# Patient Record
Sex: Male | Born: 1940 | Race: Black or African American | Hispanic: No
Health system: Southern US, Community
[De-identification: ages and names within clinical notes are randomized; demographics above are authoritative.]

## PROBLEM LIST (undated history)

## (undated) DIAGNOSIS — I252 Old myocardial infarction: Secondary | ICD-10-CM

## (undated) DIAGNOSIS — I219 Acute myocardial infarction, unspecified: Secondary | ICD-10-CM

## (undated) DIAGNOSIS — I480 Paroxysmal atrial fibrillation: Secondary | ICD-10-CM

## (undated) DIAGNOSIS — I2510 Atherosclerotic heart disease of native coronary artery without angina pectoris: Secondary | ICD-10-CM

## (undated) DIAGNOSIS — K648 Other hemorrhoids: Secondary | ICD-10-CM

## (undated) DIAGNOSIS — D126 Benign neoplasm of colon, unspecified: Secondary | ICD-10-CM

## (undated) DIAGNOSIS — K602 Anal fissure, unspecified: Secondary | ICD-10-CM

## (undated) DIAGNOSIS — K5790 Diverticulosis of intestine, part unspecified, without perforation or abscess without bleeding: Secondary | ICD-10-CM

## (undated) DIAGNOSIS — I10 Essential (primary) hypertension: Secondary | ICD-10-CM

## (undated) DIAGNOSIS — E7800 Pure hypercholesterolemia, unspecified: Secondary | ICD-10-CM

## (undated) DIAGNOSIS — Z9861 Coronary angioplasty status: Principal | ICD-10-CM

## (undated) HISTORY — PX: CORONARY ANGIOPLASTY WITH STENT PLACEMENT: SHX49

## (undated) HISTORY — DX: Coronary angioplasty status: Z98.61

## (undated) HISTORY — PX: UMBILICAL HERNIA REPAIR: SHX196

## (undated) HISTORY — DX: Atherosclerotic heart disease of native coronary artery without angina pectoris: I25.10

## (undated) HISTORY — DX: Diverticulosis of intestine, part unspecified, without perforation or abscess without bleeding: K57.90

## (undated) HISTORY — DX: Old myocardial infarction: I25.2

## (undated) HISTORY — DX: Other hemorrhoids: K64.8

## (undated) HISTORY — DX: Benign neoplasm of colon, unspecified: D12.6

## (undated) HISTORY — DX: Anal fissure, unspecified: K60.2

## (undated) HISTORY — PX: ROTATOR CUFF REPAIR: SHX139

## (undated) HISTORY — PX: TRANSTHORACIC ECHOCARDIOGRAM: SHX275

---

## 1999-07-19 ENCOUNTER — Emergency Department (HOSPITAL_COMMUNITY): Admission: EM | Admit: 1999-07-19 | Discharge: 1999-07-19 | Payer: Self-pay | Admitting: Emergency Medicine

## 1999-07-19 ENCOUNTER — Encounter: Payer: Self-pay | Admitting: Emergency Medicine

## 2003-08-15 DIAGNOSIS — D126 Benign neoplasm of colon, unspecified: Secondary | ICD-10-CM

## 2003-08-15 HISTORY — DX: Benign neoplasm of colon, unspecified: D12.6

## 2007-05-22 ENCOUNTER — Ambulatory Visit: Payer: Self-pay | Admitting: Gastroenterology

## 2007-05-26 ENCOUNTER — Ambulatory Visit: Payer: Self-pay | Admitting: Gastroenterology

## 2007-05-26 ENCOUNTER — Encounter: Payer: Self-pay | Admitting: Gastroenterology

## 2010-09-04 ENCOUNTER — Encounter: Admission: RE | Admit: 2010-09-04 | Discharge: 2010-09-04 | Payer: Self-pay | Admitting: Orthopaedic Surgery

## 2012-02-25 DIAGNOSIS — E785 Hyperlipidemia, unspecified: Secondary | ICD-10-CM | POA: Diagnosis not present

## 2012-02-25 DIAGNOSIS — Z79899 Other long term (current) drug therapy: Secondary | ICD-10-CM | POA: Diagnosis not present

## 2012-02-25 DIAGNOSIS — I10 Essential (primary) hypertension: Secondary | ICD-10-CM | POA: Diagnosis not present

## 2012-02-25 DIAGNOSIS — M109 Gout, unspecified: Secondary | ICD-10-CM | POA: Diagnosis not present

## 2012-03-12 ENCOUNTER — Encounter: Payer: Self-pay | Admitting: Internal Medicine

## 2012-04-15 ENCOUNTER — Encounter (HOSPITAL_COMMUNITY): Payer: Self-pay | Admitting: Emergency Medicine

## 2012-04-15 ENCOUNTER — Emergency Department (HOSPITAL_COMMUNITY): Payer: Medicare Other

## 2012-04-15 ENCOUNTER — Emergency Department (HOSPITAL_COMMUNITY)
Admission: EM | Admit: 2012-04-15 | Discharge: 2012-04-15 | Disposition: A | Discharge status: Home / Self Care | Payer: Medicare Other | Attending: Emergency Medicine | Admitting: Emergency Medicine

## 2012-04-15 ENCOUNTER — Encounter (HOSPITAL_COMMUNITY): Payer: Self-pay

## 2012-04-15 ENCOUNTER — Emergency Department (INDEPENDENT_AMBULATORY_CARE_PROVIDER_SITE_OTHER)
Admission: EM | Admit: 2012-04-15 | Discharge: 2012-04-15 | Disposition: A | Discharge status: Nursing Home (Includes ICF) | Payer: Medicare Other | Source: Home / Self Care

## 2012-04-15 DIAGNOSIS — N509 Disorder of male genital organs, unspecified: Secondary | ICD-10-CM

## 2012-04-15 DIAGNOSIS — Z79899 Other long term (current) drug therapy: Secondary | ICD-10-CM | POA: Insufficient documentation

## 2012-04-15 DIAGNOSIS — N50811 Right testicular pain: Secondary | ICD-10-CM

## 2012-04-15 DIAGNOSIS — N453 Epididymo-orchitis: Secondary | ICD-10-CM | POA: Insufficient documentation

## 2012-04-15 DIAGNOSIS — I10 Essential (primary) hypertension: Secondary | ICD-10-CM | POA: Diagnosis not present

## 2012-04-15 DIAGNOSIS — E7800 Pure hypercholesterolemia, unspecified: Secondary | ICD-10-CM | POA: Diagnosis not present

## 2012-04-15 HISTORY — DX: Essential (primary) hypertension: I10

## 2012-04-15 HISTORY — DX: Pure hypercholesterolemia, unspecified: E78.00

## 2012-04-15 LAB — POCT URINALYSIS DIP (DEVICE)
Bilirubin Urine: NEGATIVE
Glucose, UA: NEGATIVE mg/dL
Ketones, ur: NEGATIVE mg/dL
Nitrite: POSITIVE — AB
Protein, ur: NEGATIVE mg/dL
Specific Gravity, Urine: 1.015 (ref 1.005–1.030)
Urobilinogen, UA: 0.2 mg/dL (ref 0.0–1.0)
pH: 6.5 (ref 5.0–8.0)

## 2012-04-15 MED ORDER — HYDROCODONE-ACETAMINOPHEN 5-325 MG PO TABS: 1.0000 | ORAL_TABLET | Freq: Four times a day (QID) | ORAL | Status: AC | PRN

## 2012-04-15 MED ORDER — IBUPROFEN 600 MG PO TABS: 600.0000 mg | ORAL_TABLET | Freq: Two times a day (BID) | ORAL | Status: AC

## 2012-04-15 MED ORDER — LEVOFLOXACIN 500 MG PO TABS
500.0000 mg | ORAL_TABLET | Freq: Every day | ORAL | Status: DC
  Administered 2012-04-15: 500 mg via ORAL
  Filled 2012-04-15 (×2): qty 1

## 2012-04-15 MED ORDER — LEVOFLOXACIN 500 MG PO TABS: 500.0000 mg | ORAL_TABLET | Freq: Every day | ORAL | Status: AC

## 2012-04-15 MED ORDER — HYDROCODONE-ACETAMINOPHEN 5-325 MG PO TABS
1.0000 | ORAL_TABLET | Freq: Once | ORAL | Status: AC
  Administered 2012-04-15: 1 via ORAL
  Filled 2012-04-15: qty 1

## 2012-04-15 MED ORDER — IBUPROFEN 200 MG PO TABS
400.0000 mg | ORAL_TABLET | Freq: Once | ORAL | Status: AC
  Administered 2012-04-15: 400 mg via ORAL
  Filled 2012-04-15: qty 2

## 2012-04-15 NOTE — ED Notes (Signed)
C/o swelling and pain to rt groin and rt testicle.  States he lifted a very heavy cooler on Saturday and has been hurting since. States pain is worse with movement.

## 2012-04-15 NOTE — ED Notes (Signed)
Ok to order [place] per Dr Hyman Hopes

## 2012-04-15 NOTE — ED Notes (Signed)
Pt here from [facility] for eval of right testicle swelling and right groin pain after lifting heavy object; pt noted to have pain and swelling; concerned for torsion

## 2012-04-15 NOTE — ED Provider Notes (Signed)
History     CSN: [id number]  Arrival date & time 04/15/12  1504   None     Chief Complaint  Patient presents with  . Groin Pain    (Consider location/radiation/quality/duration/timing/severity/associated sxs/prior treatment) Patient is a 71 y.o. male presenting with groin pain. The history is provided by the patient and the spouse.  Groin Pain  Patient complains of right testicular pain that began 4 days ago after riding a motorcycle for three hours and assisting with lifting a 100lb cooler.  Pain is constant and throbbing in nature, nonradiating.  States pain is worse with sitting and with movement.  No known alleviating factors.   Denies nausea and vomiting, no fever noted, no change in urine or stool, no dysuria/frequency/urgency.  Denies history of urethral discharge prior to swelling.  No history of urological disorders/disease.  Has applied ice to area to assist with swelling and pain.    Past Medical History  Diagnosis Date  . Hypertension   . High cholesterol     Past Surgical History  Procedure Date  . Umbilical hernia repair   . Rotator cuff repair     No family history on file.  History  Substance Use Topics  . Smoking status: Never Smoker   . Smokeless tobacco: Not on file  . Alcohol Use: No      Review of Systems  All other systems reviewed and are negative.    Allergies  Review of patient's allergies indicates no known allergies.  Home Medications   Current Outpatient Rx  Name Route Sig Dispense Refill  . HYDROCHLOROTHIAZIDE 25 MG PO TABS Oral Take 25 mg by mouth daily.    . IRBESARTAN 300 MG PO TABS Oral Take 300 mg by mouth daily.    Marland Kitchen ROSUVASTATIN CALCIUM 20 MG PO TABS Oral Take 20 mg by mouth daily.      BP 119/67  Pulse 88  Temp 97.8 F (36.6 C) (Oral)  Resp 18  SpO2 97%  Physical Exam  Nursing note and vitals reviewed. Constitutional: He is oriented to person, place, and time. Vital signs are normal. He appears well-developed and  well-nourished. He is active and cooperative.  HENT:  Head: Normocephalic.  Eyes: Conjunctivae are normal. Pupils are equal, round, and reactive to light. No scleral icterus.  Neck: Trachea normal. Neck supple.  Cardiovascular: Normal rate, regular rhythm, normal heart sounds and normal pulses.   Pulmonary/Chest: Effort normal and breath sounds normal.  Abdominal: Normal appearance and bowel sounds are normal. He exhibits distension. He exhibits no mass. There is no hepatosplenomegaly. There is tenderness. There is no rebound and no CVA tenderness. Hernia confirmed negative in the right inguinal area and confirmed negative in the left inguinal area.  Genitourinary: Penis normal. Cremasteric reflex is present. Right testis shows swelling and tenderness. Circumcised. No discharge found.  Lymphadenopathy:       Right: Inguinal adenopathy present.  Neurological: He is alert and oriented to person, place, and time. No cranial nerve deficit or sensory deficit.  Skin: Skin is warm and dry.  Psychiatric: He has a normal mood and affect. His speech is normal and behavior is normal. Judgment and thought content normal. Cognition and memory are normal.    ED Course  Procedures (including critical care time)  Labs Reviewed  POCT URINALYSIS DIP (DEVICE) - Abnormal; Notable for the following:    Hgb urine dipstick SMALL (*)     Nitrite POSITIVE (*)     Leukocytes, UA MODERATE (*)  Biochemical Testing Only. Please order routine urinalysis from main lab if confirmatory testing is needed.   All other components within normal limits   No results found.   1. Right testicular pain   2. Epididymo-orchitis, acute       MDM  Probable epididymitis/orchitis infection.  Given age and complaints, after speaking with Dr. Ladon Applebaum, decision to transfer to [facility] for testicular ultrasound to rule out testicular abnormality.  Johnsie Kindred, NP 04/15/12 1758

## 2012-04-15 NOTE — ED Notes (Signed)
Pt was lifting heavy things on Saturday and has had increasing right testicle pain since. Pain is steady and progressing. Pain is 6/10. Pain increases with movement and palpation. Denies any discoloration to area. States it is swollen.

## 2012-04-15 NOTE — ED Provider Notes (Signed)
History     CSN: [id number]  Arrival date & time 04/15/12  1753   First MD Initiated Contact with Patient 04/15/12 1923      Chief Complaint  Patient presents with  . Groin Pain    (Consider location/radiation/quality/duration/timing/severity/associated sxs/prior treatment) HPI Comments: Patient is a 71 year old man presenting with right groin pain from an urgent care clinic.  He was sent for torsion rule out versus epididymitis/orchitis.  Patient denies nausea and vomiting and describes the pain as constant and throbbing.  No history of urology disorder.  Patient without any other complaints.  The history is provided by the patient.    Past Medical History  Diagnosis Date  . Hypertension   . High cholesterol     Past Surgical History  Procedure Date  . Umbilical hernia repair   . Rotator cuff repair     History reviewed. No pertinent family history.  History  Substance Use Topics  . Smoking status: Never Smoker   . Smokeless tobacco: Not on file  . Alcohol Use: No      Review of Systems  Constitutional: Negative for fever, chills and appetite change.  HENT: Negative for congestion.   Eyes: Negative for visual disturbance.  Respiratory: Negative for shortness of breath.   Cardiovascular: Negative for chest pain and leg swelling.  Gastrointestinal: Negative for abdominal pain.  Genitourinary: Positive for scrotal swelling and testicular pain. Negative for dysuria, urgency, frequency, hematuria, flank pain, decreased urine volume, discharge, penile swelling, enuresis, difficulty urinating, genital sores and penile pain.  Neurological: Negative for dizziness, syncope, weakness, light-headedness, numbness and headaches.  Psychiatric/Behavioral: Negative for confusion.  All other systems reviewed and are negative.    Allergies  Review of patient's allergies indicates no known allergies.  Home Medications   Current Outpatient Rx  Name Route Sig Dispense Refill    . COQ10 PO Oral Take 1 tablet by mouth daily.    Marland Kitchen VITAMIN B-12 PO Oral Take 1 tablet by mouth daily.    Marland Kitchen HYDROCHLOROTHIAZIDE 25 MG PO TABS Oral Take 25 mg by mouth daily.    . IRBESARTAN 300 MG PO TABS Oral Take 300 mg by mouth daily.    Marland Kitchen ROSUVASTATIN CALCIUM 20 MG PO TABS Oral Take 20 mg by mouth daily.    Marland Kitchen VITAMIN D (CHOLECALCIFEROL) PO Oral Take 1 tablet by mouth daily.      BP 144/77  Pulse 75  Temp 98.4 F (36.9 C) (Oral)  Resp 22  SpO2 100%  Physical Exam  Nursing note and vitals reviewed. Constitutional: He is oriented to person, place, and time. He appears well-developed and well-nourished. No distress.  HENT:  Head: Normocephalic and atraumatic.  Eyes: Conjunctivae and EOM are normal.  Neck: Normal range of motion.  Pulmonary/Chest: Effort normal.  Genitourinary:       Exam chaperoned.  Penis normal without tenderness, lesions, swelling, or erythema.  No discharge or penile dribbling.  Right testicle shows swelling and tenderness to palpation.  No evidence of inguinal hernias bilaterally.  Phren sign positive.   Musculoskeletal: Normal range of motion.  Neurological: He is alert and oriented to person, place, and time.  Skin: Skin is warm and dry. No rash noted. He is not diaphoretic.  Psychiatric: He has a normal mood and affect. His behavior is normal.    ED Course  Procedures (including critical care time)  Labs Reviewed - No data to display No results found.   No diagnosis found.  Component  Latest Ref Rng 04/15/2012  Glucose, UA     NEGATIVE mg/dL NEGATIVE  Bilirubin Urine     NEGATIVE NEGATIVE  Ketones, ur     NEGATIVE mg/dL NEGATIVE  Specific Gravity, Urine     1.005 - 1.030 1.015  Hgb urine dipstick     NEGATIVE SMALL (A)  pH     5.0 - 8.0 6.5  Protein     NEGATIVE mg/dL NEGATIVE  Urobilinogen, UA     0.0 - 1.0 mg/dL 0.2  Nitrite     NEGATIVE POSITIVE (A)  Leukocytes, UA     NEGATIVE MODERATE (A)    MDM  R sd  Orchitis/epidimitis  Patient will be treated with levofloxacin 500 mg by mouth daily for 10 days. Pt denies a hx of renal issues. Will place on Motrin 600 BID x 10 days to help pain. Patient has been advised to rest ice and use anti-inflammatories and scrotal support to help he is pain.  Recommended purchasing jockstrap.  Ultrasound non-concerning for testicular torsion.  Patient is hemodynamically stable and in no acute distress prior to discharge.  Patient is agreeable to plan and will followup with urology.        Jaci Carrel, [place] 04/15/12 1943

## 2012-04-15 NOTE — ED Notes (Signed)
Pt given happy meal and ginger ale per Lisette to coat stomach before giving medications.

## 2012-04-15 NOTE — ED Notes (Signed)
Pt d/c home in NAD. Pt voiced understanding of d/c instructions and follow up care. Pt states pain medication only just began to work and has not experienced significant decrease in pain as of yet.

## 2012-04-16 NOTE — ED Provider Notes (Signed)
Medical screening examination/treatment/procedure(s) were performed by non-physician practitioner and as supervising physician I was immediately available for consultation/collaboration.  Lyrah Bradt   Damonta Cossey, MD 04/16/12 1447 

## 2012-04-20 NOTE — ED Provider Notes (Signed)
Medical screening examination/treatment/procedure(s) were performed by non-physician practitioner and as supervising physician I was immediately available for consultation/collaboration.  Geoffery Lyons, MD 04/20/12 [phone]

## 2012-07-30 DIAGNOSIS — H25099 Other age-related incipient cataract, unspecified eye: Secondary | ICD-10-CM | POA: Diagnosis not present

## 2012-08-27 DIAGNOSIS — I10 Essential (primary) hypertension: Secondary | ICD-10-CM | POA: Diagnosis not present

## 2012-08-27 DIAGNOSIS — M109 Gout, unspecified: Secondary | ICD-10-CM | POA: Diagnosis not present

## 2012-08-27 DIAGNOSIS — E559 Vitamin D deficiency, unspecified: Secondary | ICD-10-CM | POA: Diagnosis not present

## 2012-08-27 DIAGNOSIS — R7309 Other abnormal glucose: Secondary | ICD-10-CM | POA: Diagnosis not present

## 2012-08-27 DIAGNOSIS — J3089 Other allergic rhinitis: Secondary | ICD-10-CM | POA: Diagnosis not present

## 2012-08-27 DIAGNOSIS — N342 Other urethritis: Secondary | ICD-10-CM | POA: Diagnosis not present

## 2012-08-27 DIAGNOSIS — Z125 Encounter for screening for malignant neoplasm of prostate: Secondary | ICD-10-CM | POA: Diagnosis not present

## 2012-08-27 DIAGNOSIS — Z0000 Encounter for general adult medical examination without abnormal findings: Secondary | ICD-10-CM | POA: Diagnosis not present

## 2012-08-27 DIAGNOSIS — Z23 Encounter for immunization: Secondary | ICD-10-CM | POA: Diagnosis not present

## 2012-08-27 DIAGNOSIS — E785 Hyperlipidemia, unspecified: Secondary | ICD-10-CM | POA: Diagnosis not present

## 2012-12-09 DIAGNOSIS — R339 Retention of urine, unspecified: Secondary | ICD-10-CM | POA: Diagnosis not present

## 2013-01-22 ENCOUNTER — Encounter: Payer: Self-pay | Admitting: Gastroenterology

## 2013-03-02 DIAGNOSIS — I10 Essential (primary) hypertension: Secondary | ICD-10-CM | POA: Diagnosis not present

## 2013-03-02 DIAGNOSIS — Z0000 Encounter for general adult medical examination without abnormal findings: Secondary | ICD-10-CM | POA: Diagnosis not present

## 2013-03-02 DIAGNOSIS — Z79899 Other long term (current) drug therapy: Secondary | ICD-10-CM | POA: Diagnosis not present

## 2013-03-02 DIAGNOSIS — E785 Hyperlipidemia, unspecified: Secondary | ICD-10-CM | POA: Diagnosis not present

## 2013-06-22 DIAGNOSIS — M1710 Unilateral primary osteoarthritis, unspecified knee: Secondary | ICD-10-CM | POA: Diagnosis not present

## 2013-08-03 DIAGNOSIS — H25099 Other age-related incipient cataract, unspecified eye: Secondary | ICD-10-CM | POA: Diagnosis not present

## 2013-08-03 DIAGNOSIS — H11159 Pinguecula, unspecified eye: Secondary | ICD-10-CM | POA: Diagnosis not present

## 2013-08-03 DIAGNOSIS — H35039 Hypertensive retinopathy, unspecified eye: Secondary | ICD-10-CM | POA: Diagnosis not present

## 2013-09-21 DIAGNOSIS — Z79899 Other long term (current) drug therapy: Secondary | ICD-10-CM | POA: Diagnosis not present

## 2013-09-21 DIAGNOSIS — Z0000 Encounter for general adult medical examination without abnormal findings: Secondary | ICD-10-CM | POA: Diagnosis not present

## 2013-09-21 DIAGNOSIS — E559 Vitamin D deficiency, unspecified: Secondary | ICD-10-CM | POA: Diagnosis not present

## 2013-09-21 DIAGNOSIS — E785 Hyperlipidemia, unspecified: Secondary | ICD-10-CM | POA: Diagnosis not present

## 2013-09-21 DIAGNOSIS — I10 Essential (primary) hypertension: Secondary | ICD-10-CM | POA: Diagnosis not present

## 2013-09-21 DIAGNOSIS — M109 Gout, unspecified: Secondary | ICD-10-CM | POA: Diagnosis not present

## 2013-09-21 DIAGNOSIS — R7309 Other abnormal glucose: Secondary | ICD-10-CM | POA: Diagnosis not present

## 2014-04-08 ENCOUNTER — Emergency Department (INDEPENDENT_AMBULATORY_CARE_PROVIDER_SITE_OTHER)
Admission: EM | Admit: 2014-04-08 | Discharge: 2014-04-08 | Disposition: A | Discharge status: Home / Self Care | Payer: Medicare Other | Source: Home / Self Care

## 2014-04-08 ENCOUNTER — Encounter (HOSPITAL_COMMUNITY): Payer: Self-pay | Admitting: Emergency Medicine

## 2014-04-08 DIAGNOSIS — S30861A Insect bite (nonvenomous) of abdominal wall, initial encounter: Secondary | ICD-10-CM

## 2014-04-08 DIAGNOSIS — S30860A Insect bite (nonvenomous) of lower back and pelvis, initial encounter: Secondary | ICD-10-CM | POA: Diagnosis not present

## 2014-04-08 DIAGNOSIS — W57XXXA Bitten or stung by nonvenomous insect and other nonvenomous arthropods, initial encounter: Secondary | ICD-10-CM | POA: Diagnosis not present

## 2014-04-08 MED ORDER — DOXYCYCLINE HYCLATE 100 MG PO CAPS: 100.0000 mg | ORAL_CAPSULE | Freq: Two times a day (BID) | ORAL | Status: DC

## 2014-04-08 NOTE — ED Provider Notes (Signed)
Medical screening examination/treatment/procedure(s) were performed by resident physician or non-physician practitioner and as supervising physician I was immediately available for consultation/collaboration.   Pauline Good MD.   Billy Fischer, MD 04/08/14 1010

## 2014-04-08 NOTE — ED Provider Notes (Signed)
CSN: [id number]     Arrival date & time 04/08/14  0849 History   First MD Initiated Contact with Patient 04/08/14 [phone]     Chief Complaint  Patient presents with  . Insect Bite   (Consider location/radiation/quality/duration/timing/severity/associated sxs/prior Treatment) HPI Comments: 4 d ago removed engorged tick from his L lateral chest/abd. Unknown type tick or time attached. Bite site initially itching, now abated. Only sx is Right sore knee   Past Medical History  Diagnosis Date  . Hypertension   . High cholesterol    Past Surgical History  Procedure Laterality Date  . Umbilical hernia repair    . Rotator cuff repair     History reviewed. No pertinent family history. History  Substance Use Topics  . Smoking status: Never Smoker   . Smokeless tobacco: Not on file  . Alcohol Use: No    Review of Systems  Constitutional: Negative.  Negative for fever.  HENT: Negative.   Respiratory: Negative.   Cardiovascular: Negative.   Gastrointestinal: Negative.   Genitourinary: Negative.   Neurological: Negative for seizures, speech difficulty and headaches.    Allergies  Review of patient's allergies indicates no known allergies.  Home Medications   Prior to Admission medications   Medication Sig Start Date End Date Taking? Authorizing Provider  Coenzyme Q10 (COQ10 PO) Take 1 tablet by mouth daily.   Yes Historical Provider, MD  hydrochlorothiazide (HYDRODIURIL) 25 MG tablet Take 25 mg by mouth daily.   Yes Historical Provider, MD  irbesartan (AVAPRO) 300 MG tablet Take 300 mg by mouth daily.   Yes Historical Provider, MD  rosuvastatin (CRESTOR) 20 MG tablet Take 20 mg by mouth daily.   Yes Historical Provider, MD  VITAMIN D, CHOLECALCIFEROL, PO Take 1 tablet by mouth daily.   Yes Historical Provider, MD  Cyanocobalamin (VITAMIN B-12 PO) Take 1 tablet by mouth daily.    Historical Provider, MD  doxycycline (VIBRAMYCIN) 100 MG capsule Take 1 capsule (100 mg total) by mouth  2 (two) times daily. 04/08/14   Janne Napoleon, NP   BP 162/77  Pulse 80  Temp(Src) 98.7 F (37.1 C) (Oral)  Resp 16  SpO2 99% Physical Exam  Nursing note and vitals reviewed. Constitutional: He is oriented to person, place, and time. He appears well-developed and well-nourished. No distress.  Eyes: Conjunctivae and EOM are normal.  Neck: Normal range of motion. Neck supple.  Cardiovascular: Normal rate.   Pulmonary/Chest: Effort normal. No respiratory distress.  Musculoskeletal: He exhibits no edema.  Neurological: He is alert and oriented to person, place, and time. He exhibits normal muscle tone.  Skin: Skin is warm and dry. No rash noted. No erythema.  Tick bite area with minor superficial darker pigmentation. No induration, swelling, draining or signs of infection.  Psychiatric: He has a normal mood and affect.    ED Course  Procedures (including critical care time) Labs Review Labs Reviewed - No data to display  Imaging Review No results found.   MDM   1. Tick bite of abdomen, initial encounter    No systemic sx's, feels well Tick engorged, unknown type or length of time attached Tx doxy course    Janne Napoleon, NP 04/08/14 [phone]

## 2014-04-08 NOTE — Discharge Instructions (Signed)
Tick Bite Information Ticks are insects that attach themselves to the skin and draw blood for food. There are various types of ticks. Common types include wood ticks and deer ticks. Most ticks live in shrubs and grassy areas. Ticks can climb onto your body when you make contact with leaves or grass where the tick is waiting. The most common places on the body for ticks to attach themselves are the scalp, neck, armpits, waist, and groin. Most tick bites are harmless, but sometimes ticks carry germs that cause diseases. These germs can be spread to a person during the tick's feeding process. The chance of a disease spreading through a tick bite depends on:   The type of tick.  Time of year.   How long the tick is attached.   Geographic location.  HOW CAN YOU PREVENT TICK BITES? Take these steps to help prevent tick bites when you are outdoors:  Wear protective clothing. Long sleeves and long pants are best.   Wear white clothes so you can see ticks more easily.  Tuck your pant legs into your socks.   If walking on a trail, stay in the middle of the trail to avoid brushing against bushes.  Avoid walking through areas with long grass.  Put insect repellent on all exposed skin and along boot tops, pant legs, and sleeve cuffs.   Check clothing, hair, and skin repeatedly and before going inside.   Brush off any ticks that are not attached.  Take a shower or bath as soon as possible after being outdoors.  WHAT IS THE PROPER WAY TO REMOVE A TICK? Ticks should be removed as soon as possible to help prevent diseases caused by tick bites. 1. If latex gloves are available, put them on before trying to remove a tick.  2. Using fine-point tweezers, grasp the tick as close to the skin as possible. You may also use curved forceps or a tick removal tool. Grasp the tick as close to its head as possible. Avoid grasping the tick on its body. 3. Pull gently with steady upward pressure until  the tick lets go. Do not twist the tick or jerk it suddenly. This may break off the tick's head or mouth parts. 4. Do not squeeze or crush the tick's body. This could force disease-carrying fluids from the tick into your body.  5. After the tick is removed, wash the bite area and your hands with soap and water or other disinfectant such as alcohol. 6. Apply a small amount of antiseptic cream or ointment to the bite site.  7. Wash and disinfect any instruments that were used.  Do not try to remove a tick by applying a hot match, petroleum jelly, or fingernail polish to the tick. These methods do not work and may increase the chances of disease being spread from the tick bite.  WHEN SHOULD YOU SEEK MEDICAL CARE? Contact your health care provider if you are unable to remove a tick from your skin or if a part of the tick breaks off and is stuck in the skin.  After a tick bite, you need to be aware of signs and symptoms that could be related to diseases spread by ticks. Contact your health care provider if you develop any of the following in the days or weeks after the tick bite:  Unexplained fever.  Rash. A circular rash that appears days or weeks after the tick bite may indicate the possibility of Lyme disease. The rash may resemble   a target with a bull's-eye and may occur at a different part of your body than the tick bite.  Redness and swelling in the area of the tick bite.   Tender, swollen lymph glands.   Diarrhea.   Weight loss.   Cough.   Fatigue.   Muscle, joint, or bone pain.   Abdominal pain.   Headache.   Lethargy or a change in your level of consciousness.  Difficulty walking or moving your legs.   Numbness in the legs.   Paralysis.  Shortness of breath.   Confusion.   Repeated vomiting.  Document Released: 09/27/2000 Document Revised: 07/21/2013 Document Reviewed: 03/10/2013 [employer] Patient Information 2015 [employer]. This information is  not intended to replace advice given to you by your health care provider. Make sure you discuss any questions you have with your health care provider.  

## 2014-04-08 NOTE — ED Notes (Signed)
Found tick on Monday. Concerned about body and joint aches since then; NAD

## 2014-08-25 DIAGNOSIS — H3509 Other intraretinal microvascular abnormalities: Secondary | ICD-10-CM | POA: Diagnosis not present

## 2015-01-02 ENCOUNTER — Emergency Department (HOSPITAL_COMMUNITY)
Admission: EM | Admit: 2015-01-02 | Discharge: 2015-01-02 | Disposition: A | Discharge status: Home / Self Care | Payer: Medicare Other | Attending: Emergency Medicine | Admitting: Emergency Medicine

## 2015-01-02 ENCOUNTER — Encounter (HOSPITAL_COMMUNITY): Payer: Self-pay | Admitting: Emergency Medicine

## 2015-01-02 DIAGNOSIS — K602 Anal fissure, unspecified: Secondary | ICD-10-CM

## 2015-01-02 DIAGNOSIS — Z792 Long term (current) use of antibiotics: Secondary | ICD-10-CM | POA: Insufficient documentation

## 2015-01-02 DIAGNOSIS — I10 Essential (primary) hypertension: Secondary | ICD-10-CM | POA: Insufficient documentation

## 2015-01-02 DIAGNOSIS — Z79899 Other long term (current) drug therapy: Secondary | ICD-10-CM | POA: Diagnosis not present

## 2015-01-02 DIAGNOSIS — E780 Pure hypercholesterolemia: Secondary | ICD-10-CM | POA: Diagnosis not present

## 2015-01-02 DIAGNOSIS — K625 Hemorrhage of anus and rectum: Secondary | ICD-10-CM | POA: Diagnosis present

## 2015-01-02 LAB — COMPREHENSIVE METABOLIC PANEL
ALK PHOS: 65 U/L (ref 39–117)
ALT: 19 U/L (ref 0–53)
AST: 23 U/L (ref 0–37)
Albumin: 4.7 g/dL (ref 3.5–5.2)
Anion gap: 7 (ref 5–15)
BUN: 16 mg/dL (ref 6–23)
CHLORIDE: 104 mmol/L (ref 96–112)
CO2: 27 mmol/L (ref 19–32)
Calcium: 9.3 mg/dL (ref 8.4–10.5)
Creatinine, Ser: 1.13 mg/dL (ref 0.50–1.35)
GFR calc Af Amer: 73 mL/min — ABNORMAL LOW (ref >90)
GFR, EST NON AFRICAN AMERICAN: 63 mL/min — AB (ref >90)
GLUCOSE: 114 mg/dL — AB (ref 70–99)
POTASSIUM: 4.2 mmol/L (ref 3.5–5.1)
Sodium: 138 mmol/L (ref 135–145)
Total Bilirubin: 0.7 mg/dL (ref 0.3–1.2)
Total Protein: 8.2 g/dL (ref 6.0–8.3)

## 2015-01-02 LAB — CBC
HEMATOCRIT: 47.1 % (ref 39.0–52.0)
HEMOGLOBIN: 15.6 g/dL (ref 13.0–17.0)
MCH: 31.1 pg (ref 26.0–34.0)
MCHC: 33.1 g/dL (ref 30.0–36.0)
MCV: 93.8 fL (ref 78.0–100.0)
Platelets: 240 10*3/uL (ref 150–400)
RBC: 5.02 MIL/uL (ref 4.22–5.81)
RDW: 12.7 % (ref 11.5–15.5)
WBC: 7.5 10*3/uL (ref 4.0–10.5)

## 2015-01-02 MED ORDER — DOCUSATE SODIUM 100 MG PO CAPS: 100.0000 mg | ORAL_CAPSULE | Freq: Two times a day (BID) | ORAL | Status: DC

## 2015-01-02 NOTE — ED Provider Notes (Addendum)
CSN: [id number]     Arrival date & time 01/02/15  1452 History   First MD Initiated Contact with Patient 01/02/15 1744     Chief Complaint  Patient presents with  . Rectal Bleeding     (Consider location/radiation/quality/duration/timing/severity/associated sxs/prior Treatment) HPI Comments: Patient here complaining of bright red blood per rectum which began today. Noted blood mixed in the stool. Denies any anal pain. Has had constipation for quite some time. No history of colon cancer although he does have a history of polyps. Denies any abdominal pain. No fever or chills. Denies any weakness. States he feels as baseline. Symptoms persistent and nothing improves them. No treatment use prior to arrival.  Patient is a 75 y.o. male presenting with hematochezia. The history is provided by the patient.  Rectal Bleeding   Past Medical History  Diagnosis Date  . Hypertension   . High cholesterol    Past Surgical History  Procedure Laterality Date  . Umbilical hernia repair    . Rotator cuff repair     No family history on file. History  Substance Use Topics  . Smoking status: Never Smoker   . Smokeless tobacco: Not on file  . Alcohol Use: No    Review of Systems  Gastrointestinal: Positive for hematochezia.  All other systems reviewed and are negative.     Allergies  Review of patient's allergies indicates no known allergies.  Home Medications   Prior to Admission medications   Medication Sig Start Date End Date Taking? Authorizing Provider  hydrochlorothiazide (HYDRODIURIL) 25 MG tablet Take 25 mg by mouth daily.   Yes Historical Provider, MD  irbesartan (AVAPRO) 300 MG tablet Take 300 mg by mouth daily.   Yes Historical Provider, MD  rosuvastatin (CRESTOR) 20 MG tablet Take 20 mg by mouth at bedtime.    Yes Historical Provider, MD  VITAMIN D, CHOLECALCIFEROL, PO Take 1 tablet by mouth daily.   Yes Historical Provider, MD  doxycycline (VIBRAMYCIN) 100 MG capsule Take 1  capsule (100 mg total) by mouth 2 (two) times daily. Patient not taking: Reported on 01/02/2015 04/08/14   Janne Napoleon, NP   BP 154/64 mmHg  Pulse 75  Temp(Src) 98.7 F (37.1 C) (Oral)  Resp 20  SpO2 96% Physical Exam  Constitutional: He is oriented to person, place, and time. He appears well-developed and well-nourished.  Non-toxic appearance. No distress.  HENT:  Head: Normocephalic and atraumatic.  Eyes: Conjunctivae, EOM and lids are normal. Pupils are equal, round, and reactive to light.  Neck: Normal range of motion. Neck supple. No tracheal deviation present. No thyroid mass present.  Cardiovascular: Normal rate, regular rhythm and normal heart sounds.  Exam reveals no gallop.   No murmur heard. Pulmonary/Chest: Effort normal and breath sounds normal. No stridor. No respiratory distress. He has no decreased breath sounds. He has no wheezes. He has no rhonchi. He has no rales.  Abdominal: Soft. Normal appearance and bowel sounds are normal. He exhibits no distension. There is no tenderness. There is no rebound and no CVA tenderness.  Genitourinary: Rectal exam shows no external hemorrhoid, no internal hemorrhoid and no tenderness.  Pinkish discharge noted without blood clots  Musculoskeletal: Normal range of motion. He exhibits no edema or tenderness.  Neurological: He is alert and oriented to person, place, and time. He has normal strength. No cranial nerve deficit or sensory deficit. GCS eye subscore is 4. GCS verbal subscore is 5. GCS motor subscore is 6.  Skin: Skin is warm  and dry. No abrasion and no rash noted.  Psychiatric: He has a normal mood and affect. His speech is normal and behavior is normal.  Nursing note and vitals reviewed.   ED Course  Procedures (including critical care time) Labs Review Labs Reviewed  COMPREHENSIVE METABOLIC PANEL - Abnormal; Notable for the following:    Glucose, Bld 114 (*)    GFR calc non Af Amer 63 (*)    GFR calc Af Amer 73 (*)    All  other components within normal limits  CBC    Imaging Review No results found.   EKG Interpretation None      MDM   Final diagnoses:  None    Patient's labs reviewed and no signs of acute hemorrhage. Patient has a long-standing history of constipation and suspect that he has anal fissures. Patient has a gastric neurologist who he was encouraged to follow-up with. Will place patient on Colace and instructed him to have a high-fiber diet    Lacretia Leigh, MD 01/02/15 1800  Lacretia Leigh, MD 01/02/15 [phone]

## 2015-01-02 NOTE — Discharge Instructions (Signed)
Call your gastroenterologist tomorrow to schedule a follow-up visit. Return here at once if the bleeding comes worse, for weakness, or for any other problems Anal Fissure, Adult An anal fissure is a small tear or crack in the skin around the anus. Bleeding from a fissure usually stops on its own within a few minutes. However, bleeding will often reoccur with each bowel movement until the crack heals.  CAUSES   Passing large, hard stools.  Frequent diarrheal stools.  Constipation.  Inflammatory bowel disease (Crohn's disease or ulcerative colitis).  Infections.  Anal sex. SYMPTOMS   Small amounts of blood seen on your stools, on toilet paper, or in the toilet after a bowel movement.  Rectal bleeding.  Painful bowel movements.  Itching or irritation around the anus. DIAGNOSIS Your caregiver will examine the anal area. An anal fissure can usually be seen with careful inspection. A rectal exam may be performed and a short tube (anoscope) may be used to examine the anal canal. TREATMENT   You may be instructed to take fiber supplements. These supplements can soften your stool to help make bowel movements easier.  Sitz baths may be recommended to help heal the tear. Do not use soap in the sitz baths.  A medicated cream or ointment may be prescribed to lessen discomfort. HOME CARE INSTRUCTIONS   Maintain a diet high in fruits, whole grains, and vegetables. Avoid constipating foods like bananas and dairy products.  Take sitz baths as directed by your caregiver.  Drink enough fluids to keep your urine clear or pale yellow.  Only take over-the-counter or prescription medicines for pain, discomfort, or fever as directed by your caregiver. Do not take aspirin as this may increase bleeding.  Do not use ointments containing numbing medications (anesthetics) or hydrocortisone. They could slow healing. SEEK MEDICAL CARE IF:   Your fissure is not completely healed within 3 days.  You  have further bleeding.  You have a fever.  You have diarrhea mixed with blood.  You have pain.  Your problem is getting worse rather than better. MAKE SURE YOU:   Understand these instructions.  Will watch your condition.  Will get help right away if you are not doing well or get worse. Document Released: 09/30/2005 Document Revised: 12/23/2011 Document Reviewed: 03/17/2011 [facility] Patient Information 2015 [place], [place]. This information is not intended to replace advice given to you by your health care provider. Make sure you discuss any questions you have with your health care provider. Gastrointestinal Bleeding Gastrointestinal (GI) bleeding means there is bleeding somewhere along the digestive tract, between the mouth and anus. CAUSES  There are many different problems that can cause GI bleeding. Possible causes include:  Esophagitis. This is inflammation, irritation, or swelling of the esophagus.  Hemorrhoids.These are veins that are full of blood (engorged) in the rectum. They cause pain, inflammation, and may bleed.  Anal fissures.These are areas of painful tearing which may bleed. They are often caused by passing hard stool.  Diverticulosis.These are pouches that form on the colon over time, with age, and may bleed significantly.  Diverticulitis.This is inflammation in areas with diverticulosis. It can cause pain, fever, and bloody stools, although bleeding is rare.  Polyps and cancer. Colon cancer often starts out as precancerous polyps.  Gastritis and ulcers.Bleeding from the upper gastrointestinal tract (near the stomach) may travel through the intestines and produce black, sometimes tarry, often bad smelling stools. In certain cases, if the bleeding is fast enough, the stools may not be  black, but red. This condition may be life-threatening. SYMPTOMS   Vomiting bright red blood or material that looks like coffee grounds.  Bloody, black, or tarry  stools. DIAGNOSIS  Your caregiver may diagnose your condition by taking your history and performing a physical exam. More tests may be needed, including:  X-rays and other imaging tests.  Esophagogastroduodenoscopy (EGD). This test uses a flexible, lighted tube to look at your esophagus, stomach, and small intestine.  Colonoscopy. This test uses a flexible, lighted tube to look at your colon. TREATMENT  Treatment depends on the cause of your bleeding.   For bleeding from the esophagus, stomach, small intestine, or colon, the caregiver doing your EGD or colonoscopy may be able to stop the bleeding as part of the procedure.  Inflammation or infection of the colon can be treated with medicines.  Many rectal problems can be treated with creams, suppositories, or warm baths.  Surgery is sometimes needed.  Blood transfusions are sometimes needed if you have lost a lot of blood. If bleeding is slow, you may be allowed to go home. If there is a lot of bleeding, you will need to stay in the hospital for observation. HOME CARE INSTRUCTIONS   Take any medicines exactly as prescribed.  Keep your stools soft by eating foods that are high in fiber. These foods include whole grains, legumes, fruits, and vegetables. Prunes (1 to 3 a day) work well for many people.  Drink enough fluids to keep your urine clear or pale yellow. SEEK IMMEDIATE MEDICAL CARE IF:   Your bleeding increases.  You feel lightheaded, weak, or you faint.  You have severe cramps in your back or abdomen.  You pass large blood clots in your stool.  Your problems are getting worse. MAKE SURE YOU:   Understand these instructions.  Will watch your condition.  Will get help right away if you are not doing well or get worse. Document Released: 09/27/2000 Document Revised: 09/16/2012 Document Reviewed: 09/09/2011 [facility] Patient Information 2015 [place], [place]. This information is not intended to replace advice given to  you by your health care provider. Make sure you discuss any questions you have with your health care provider.

## 2015-01-02 NOTE — ED Notes (Signed)
Pt states that this morning he noticed bright red blood in stool, denies abd pain or rectal pain.

## 2015-01-10 ENCOUNTER — Encounter: Payer: Self-pay | Admitting: Gastroenterology

## 2015-01-13 DIAGNOSIS — E559 Vitamin D deficiency, unspecified: Secondary | ICD-10-CM | POA: Diagnosis not present

## 2015-01-13 DIAGNOSIS — E785 Hyperlipidemia, unspecified: Secondary | ICD-10-CM | POA: Diagnosis not present

## 2015-01-13 DIAGNOSIS — Z79899 Other long term (current) drug therapy: Secondary | ICD-10-CM | POA: Diagnosis not present

## 2015-01-13 DIAGNOSIS — K5900 Constipation, unspecified: Secondary | ICD-10-CM | POA: Diagnosis not present

## 2015-01-13 DIAGNOSIS — I10 Essential (primary) hypertension: Secondary | ICD-10-CM | POA: Diagnosis not present

## 2015-03-03 ENCOUNTER — Encounter: Payer: Self-pay | Admitting: Gastroenterology

## 2015-03-03 ENCOUNTER — Ambulatory Visit (INDEPENDENT_AMBULATORY_CARE_PROVIDER_SITE_OTHER): Payer: Medicare Other | Admitting: Gastroenterology

## 2015-03-03 VITALS — BP 138/70 | HR 76 | Ht 68.75 in | Wt 226.2 lb

## 2015-03-03 DIAGNOSIS — K921 Melena: Secondary | ICD-10-CM | POA: Diagnosis not present

## 2015-03-03 DIAGNOSIS — K5900 Constipation, unspecified: Secondary | ICD-10-CM | POA: Diagnosis not present

## 2015-03-03 DIAGNOSIS — Z86010 Personal history of colonic polyps: Secondary | ICD-10-CM | POA: Diagnosis not present

## 2015-03-03 MED ORDER — NA SULFATE-K SULFATE-MG SULF 17.5-3.13-1.6 GM/177ML PO SOLN: 1.0000 | Freq: Once | ORAL | Status: DC

## 2015-03-03 NOTE — Patient Instructions (Addendum)
You have been scheduled for a colonoscopy. Please follow written instructions given to you at your visit today.  Please pick up your prep supplies at the pharmacy within the next 1-3 days. If you use inhalers (even only as needed), please bring them with you on the day of your procedure. Your physician has requested that you go to www.startemmi.com and enter the access code given to you at your visit today. This web site gives a general overview about your procedure. However, you should still follow specific instructions given to you by our office regarding your preparation for the procedure.  Thank you for choosing me and [facility].  Malcolm T. Stark, Jr., MD., FACG  

## 2015-03-03 NOTE — Progress Notes (Signed)
    History of Present Illness: This is a 74 year old male with rectal bleeding. He had 2 days of rectal bleeding and was seen in [facility] ED. hemoglobin was normal. Bleeding was felt to be minor and he was sent home. He has had no further bleeding since then. He does have ongoing problems with constipation and he was recommended to use a high-fiber diet and daily Colace which has helped his constipation. He is overdue for surveillance colonoscopy. Denies weight loss, abdominal pain, diarrhea, change in stool caliber, melena, nausea, vomiting, dysphagia, reflux symptoms, chest pain.    Review of Systems: Pertinent positive and negative review of systems were noted in the above HPI section. All other review of systems were otherwise negative.  Current Medications, Allergies, Past Medical History, Past Surgical History, Family History and Social History were reviewed in [employer] record.  Physical Exam: General: Well developed, well nourished, no acute distress Head: Normocephalic and atraumatic Eyes:  sclerae anicteric, EOMI Ears: Normal auditory acuity Mouth: No deformity or lesions Neck: Supple, no masses or thyromegaly Lungs: Clear throughout to auscultation Heart: Regular rate and rhythm; no murmurs, rubs or bruits Abdomen: Soft, non tender and non distended. No masses, hepatosplenomegaly or hernias noted. Normal Bowel sounds Rectal: Deferred to colonoscopy Musculoskeletal: Symmetrical with no gross deformities  Skin: No lesions on visible extremities Pulses:  Normal pulses noted Extremities: No clubbing, cyanosis, edema or deformities noted Neurological: Alert oriented x 4, grossly nonfocal Cervical Nodes:  No significant cervical adenopathy Inguinal Nodes: No significant inguinal adenopathy Psychological:  Alert and cooperative. Normal mood and affect  Assessment and Recommendations:  1. Self-limited small volume hematochezia. I suspect this is a benign  anorectal source such as a hemorrhoid. Further evaluation at colonoscopy.  2. Chronic constipation. Maintain a high-fiber diet with adequate daily water intake and daily Colace.  3. Personal history of adenomatous colon polyps. Overdue for surveillance colonoscopy. Schedule colonoscopy. The risks (including bleeding, perforation, infection, missed lesions, medication reactions and possible hospitalization or surgery if complications occur), benefits, and alternatives to colonoscopy with possible biopsy and possible polypectomy were discussed with the patient and they consent to proceed.

## 2015-03-14 ENCOUNTER — Ambulatory Visit (AMBULATORY_SURGERY_CENTER): Payer: Medicare Other | Admitting: Gastroenterology

## 2015-03-14 ENCOUNTER — Encounter: Payer: Self-pay | Admitting: Gastroenterology

## 2015-03-14 VITALS — BP 106/56 | HR 66 | Temp 98.0°F | Resp 25 | Ht 68.75 in | Wt 226.0 lb

## 2015-03-14 DIAGNOSIS — D127 Benign neoplasm of rectosigmoid junction: Secondary | ICD-10-CM

## 2015-03-14 DIAGNOSIS — D123 Benign neoplasm of transverse colon: Secondary | ICD-10-CM

## 2015-03-14 DIAGNOSIS — D120 Benign neoplasm of cecum: Secondary | ICD-10-CM | POA: Diagnosis not present

## 2015-03-14 DIAGNOSIS — K921 Melena: Secondary | ICD-10-CM

## 2015-03-14 DIAGNOSIS — I10 Essential (primary) hypertension: Secondary | ICD-10-CM | POA: Diagnosis not present

## 2015-03-14 DIAGNOSIS — Z86010 Personal history of colonic polyps: Secondary | ICD-10-CM

## 2015-03-14 MED ORDER — SODIUM CHLORIDE 0.9 % IV SOLN: 500.0000 mL | INTRAVENOUS | Status: DC

## 2015-03-14 NOTE — Progress Notes (Signed)
Report to PACU, RN, vss, BBS= Clear.  

## 2015-03-14 NOTE — Progress Notes (Signed)
Called to room to assist during endoscopic procedure.  Patient ID and intended procedure confirmed with present staff. Received instructions for my participation in the procedure from the performing physician.Called to room to assist during endoscopic procedure.  Patient ID and intended procedure confirmed with present staff. Received instructions for my participation in the procedure from the performing physician. 

## 2015-03-14 NOTE — Op Note (Signed)
[place]  [address]. [place], [id number]   COLONOSCOPY PROCEDURE REPORT  PATIENT: Travis Key, Travis Key  MR#: [id number] BIRTHDATE: [birth year] , 73  yrs. old GENDER: male ENDOSCOPIST: Ladene Artist, MD, [facility] PROCEDURE DATE:  03/14/2015 PROCEDURE:   Colonoscopy, surveillance , Colonoscopy with biopsy, and Colonoscopy with snare polypectomy First Screening Colonoscopy - Avg.  risk and is 50 yrs.  old or older - No.  Prior Negative Screening - Now for repeat screening. N/A  History of Adenoma - Now for follow-up colonoscopy & has been > or = to 3 yrs.  Yes hx of adenoma.  Has been 3 or more years since last colonoscopy.  Polyps removed today? Yes ASA CLASS:   Class II INDICATIONS:Surveillance due to prior colonic neoplasia and PH Colon Adenoma. MEDICATIONS: Monitored anesthesia care and Propofol 200 mg IV DESCRIPTION OF PROCEDURE:   After the risks benefits and alternatives of the procedure were thoroughly explained, informed consent was obtained.  The digital rectal exam revealed no abnormalities of the rectum.   The [id number]  endoscope was introduced through the anus and advanced to the cecum, which was identified by both the appendix and ileocecal valve. No adverse events experienced.   The quality of the prep was good.  (Suprep was used)  The instrument was then slowly withdrawn as the colon was fully examined. Estimated blood loss is zero unless otherwise noted in this procedure report.   COLON FINDINGS: Two sessile polyps measuring 6-7 mm in size were found in the transverse colon and at the ileocecal valve. Polypectomies were performed with a cold snare.  The resection was complete, the polyp tissue was completely retrieved and sent to histology.   There was mild diverticulosis noted in the sigmoid colon.   Four sessile polyps measuring 3-4 mm in size were found in the sigmoid colon and rectum.  Polypectomies were performed with cold forceps.  The  resection was complete, the polyp tissue was completely retrieved and sent to histology.   The examination was otherwise normal.  Retroflexed views revealed internal Grade I hemorrhoids. The time to cecum = 2.2 Withdrawal time = 11.0   The scope was withdrawn and the procedure completed. COMPLICATIONS: There were no immediate complications.  ENDOSCOPIC IMPRESSION: 1.   Two sessile polyps in the transverse colon and at the ileocecal valve; polypectomies performed with a cold snare 2.   Mild diverticulosis in the sigmoid colon 3.   Four sessile polyps in the sigmoid colon and rectum; polypectomies performed with cold forceps 4.   Moderate grade l internal hemorrhoids  RECOMMENDATIONS: 1.  Await pathology results 2.  High fiber diet with liberal fluid intake. 3.  Repeat Colonoscopy in 5 years.  eSigned:  Ladene Artist, MD, [facility] 03/14/2015 3:18 PM

## 2015-03-14 NOTE — Patient Instructions (Signed)

## 2015-03-15 ENCOUNTER — Telehealth: Payer: Self-pay | Admitting: *Deleted

## 2015-03-15 NOTE — Telephone Encounter (Signed)
  Follow up Call-  Call back number 03/14/2015  Post procedure Call Back phone  # [phone]  Permission to leave phone message No     Patient questions:  Do you have a fever, pain , or abdominal swelling? No. Pain Score  0 *  Have you tolerated food without any problems? Yes.    Have you been able to return to your normal activities? Yes.    Do you have any questions about your discharge instructions: Diet   No. Medications  No. Follow up visit  No.  Do you have questions or concerns about your Care? No.  Actions: * If pain score is 4 or above: No action needed, pain <4.

## 2015-03-16 ENCOUNTER — Encounter: Payer: Self-pay | Admitting: Gastroenterology

## 2015-03-24 ENCOUNTER — Encounter: Payer: Self-pay | Admitting: Gastroenterology

## 2015-07-19 DIAGNOSIS — Z7982 Long term (current) use of aspirin: Secondary | ICD-10-CM | POA: Diagnosis not present

## 2015-07-19 DIAGNOSIS — I10 Essential (primary) hypertension: Secondary | ICD-10-CM | POA: Diagnosis not present

## 2015-07-19 DIAGNOSIS — Z125 Encounter for screening for malignant neoplasm of prostate: Secondary | ICD-10-CM | POA: Diagnosis not present

## 2015-07-19 DIAGNOSIS — E559 Vitamin D deficiency, unspecified: Secondary | ICD-10-CM | POA: Diagnosis not present

## 2015-07-19 DIAGNOSIS — R739 Hyperglycemia, unspecified: Secondary | ICD-10-CM | POA: Diagnosis not present

## 2015-07-19 DIAGNOSIS — E784 Other hyperlipidemia: Secondary | ICD-10-CM | POA: Diagnosis not present

## 2015-07-19 DIAGNOSIS — Z0000 Encounter for general adult medical examination without abnormal findings: Secondary | ICD-10-CM | POA: Diagnosis not present

## 2015-09-20 DIAGNOSIS — H35039 Hypertensive retinopathy, unspecified eye: Secondary | ICD-10-CM | POA: Diagnosis not present

## 2016-01-15 DIAGNOSIS — I10 Essential (primary) hypertension: Secondary | ICD-10-CM | POA: Diagnosis not present

## 2016-01-15 DIAGNOSIS — Z79899 Other long term (current) drug therapy: Secondary | ICD-10-CM | POA: Diagnosis not present

## 2016-01-15 DIAGNOSIS — E784 Other hyperlipidemia: Secondary | ICD-10-CM | POA: Diagnosis not present

## 2016-01-15 DIAGNOSIS — E559 Vitamin D deficiency, unspecified: Secondary | ICD-10-CM | POA: Diagnosis not present

## 2016-07-10 DIAGNOSIS — R300 Dysuria: Secondary | ICD-10-CM | POA: Diagnosis not present

## 2016-07-10 DIAGNOSIS — Z79899 Other long term (current) drug therapy: Secondary | ICD-10-CM | POA: Diagnosis not present

## 2016-07-10 DIAGNOSIS — I10 Essential (primary) hypertension: Secondary | ICD-10-CM | POA: Diagnosis not present

## 2016-09-23 DIAGNOSIS — H35039 Hypertensive retinopathy, unspecified eye: Secondary | ICD-10-CM | POA: Diagnosis not present

## 2017-09-13 DIAGNOSIS — I2510 Atherosclerotic heart disease of native coronary artery without angina pectoris: Secondary | ICD-10-CM

## 2017-09-13 DIAGNOSIS — Z9861 Coronary angioplasty status: Secondary | ICD-10-CM

## 2017-09-13 HISTORY — DX: Atherosclerotic heart disease of native coronary artery without angina pectoris: I25.10

## 2017-09-13 HISTORY — DX: Coronary angioplasty status: Z98.61

## 2017-09-14 ENCOUNTER — Encounter (HOSPITAL_COMMUNITY): Payer: Self-pay | Admitting: Emergency Medicine

## 2017-09-14 ENCOUNTER — Inpatient Hospital Stay (HOSPITAL_COMMUNITY): Payer: Medicare Other

## 2017-09-14 ENCOUNTER — Emergency Department (HOSPITAL_COMMUNITY): Payer: Medicare Other

## 2017-09-14 ENCOUNTER — Encounter (HOSPITAL_COMMUNITY)
Admission: EM | Disposition: A | Discharge status: Home / Self Care | Payer: Self-pay | Source: Home / Self Care | Attending: Cardiology

## 2017-09-14 ENCOUNTER — Inpatient Hospital Stay (HOSPITAL_COMMUNITY)
Admission: EM | Admit: 2017-09-14 | Discharge: 2017-09-16 | DRG: 247 | Disposition: A | Discharge status: Home / Self Care | Payer: Medicare Other | Attending: Cardiology | Admitting: Cardiology

## 2017-09-14 ENCOUNTER — Other Ambulatory Visit: Payer: Self-pay

## 2017-09-14 DIAGNOSIS — I10 Essential (primary) hypertension: Secondary | ICD-10-CM | POA: Diagnosis not present

## 2017-09-14 DIAGNOSIS — E785 Hyperlipidemia, unspecified: Secondary | ICD-10-CM

## 2017-09-14 DIAGNOSIS — Z955 Presence of coronary angioplasty implant and graft: Secondary | ICD-10-CM

## 2017-09-14 DIAGNOSIS — Z79899 Other long term (current) drug therapy: Secondary | ICD-10-CM

## 2017-09-14 DIAGNOSIS — I2510 Atherosclerotic heart disease of native coronary artery without angina pectoris: Secondary | ICD-10-CM | POA: Insufficient documentation

## 2017-09-14 DIAGNOSIS — I213 ST elevation (STEMI) myocardial infarction of unspecified site: Secondary | ICD-10-CM | POA: Diagnosis not present

## 2017-09-14 DIAGNOSIS — I2102 ST elevation (STEMI) myocardial infarction involving left anterior descending coronary artery: Secondary | ICD-10-CM

## 2017-09-14 DIAGNOSIS — E876 Hypokalemia: Secondary | ICD-10-CM | POA: Diagnosis present

## 2017-09-14 DIAGNOSIS — Z9861 Coronary angioplasty status: Principal | ICD-10-CM

## 2017-09-14 DIAGNOSIS — I2582 Chronic total occlusion of coronary artery: Secondary | ICD-10-CM | POA: Diagnosis present

## 2017-09-14 DIAGNOSIS — Z7982 Long term (current) use of aspirin: Secondary | ICD-10-CM | POA: Diagnosis not present

## 2017-09-14 DIAGNOSIS — I2129 ST elevation (STEMI) myocardial infarction involving other sites: Principal | ICD-10-CM

## 2017-09-14 DIAGNOSIS — Z87891 Personal history of nicotine dependence: Secondary | ICD-10-CM

## 2017-09-14 DIAGNOSIS — I252 Old myocardial infarction: Secondary | ICD-10-CM | POA: Diagnosis present

## 2017-09-14 DIAGNOSIS — I255 Ischemic cardiomyopathy: Secondary | ICD-10-CM | POA: Diagnosis not present

## 2017-09-14 DIAGNOSIS — I2109 ST elevation (STEMI) myocardial infarction involving other coronary artery of anterior wall: Secondary | ICD-10-CM | POA: Diagnosis present

## 2017-09-14 HISTORY — PX: CORONARY STENT INTERVENTION: CATH118234

## 2017-09-14 HISTORY — DX: Old myocardial infarction: I25.2

## 2017-09-14 HISTORY — PX: LEFT HEART CATH AND CORONARY ANGIOGRAPHY: CATH118249

## 2017-09-14 HISTORY — PX: CORONARY/GRAFT ACUTE MI REVASCULARIZATION: CATH118305

## 2017-09-14 LAB — LIPID PANEL
CHOL/HDL RATIO: 3.7 ratio
CHOLESTEROL: 159 mg/dL (ref 0–200)
HDL: 43 mg/dL (ref >40)
LDL Cholesterol: 94 mg/dL (ref 0–99)
Triglycerides: 108 mg/dL (ref <150)
VLDL: 22 mg/dL (ref 0–40)

## 2017-09-14 LAB — BASIC METABOLIC PANEL
Anion gap: 15 (ref 5–15)
BUN: 13 mg/dL (ref 6–20)
CALCIUM: 9.8 mg/dL (ref 8.9–10.3)
CO2: 24 mmol/L (ref 22–32)
CREATININE: 1.3 mg/dL — AB (ref 0.61–1.24)
Chloride: 101 mmol/L (ref 101–111)
GFR calc Af Amer: 60 mL/min — ABNORMAL LOW (ref >60)
GFR calc non Af Amer: 52 mL/min — ABNORMAL LOW (ref >60)
GLUCOSE: 119 mg/dL — AB (ref 65–99)
Potassium: 2.8 mmol/L — ABNORMAL LOW (ref 3.5–5.1)
Sodium: 140 mmol/L (ref 135–145)

## 2017-09-14 LAB — POCT ACTIVATED CLOTTING TIME: ACTIVATED CLOTTING TIME: 307 s

## 2017-09-14 LAB — MRSA PCR SCREENING: MRSA by PCR: NEGATIVE

## 2017-09-14 LAB — ECHOCARDIOGRAM COMPLETE
HEIGHTINCHES: 71 in
WEIGHTICAEL: 3513.25 [oz_av]

## 2017-09-14 LAB — I-STAT TROPONIN, ED: TROPONIN I, POC: 0.02 ng/mL (ref 0.00–0.08)

## 2017-09-14 LAB — TROPONIN I
TROPONIN I: 0.03 ng/mL — AB (ref <0.03)
TROPONIN I: 0.65 ng/mL — AB (ref <0.03)
TROPONIN I: 10.47 ng/mL — AB (ref <0.03)
Troponin I: 6.42 ng/mL (ref <0.03)

## 2017-09-14 LAB — CBC
HCT: 48.1 % (ref 39.0–52.0)
Hemoglobin: 16.3 g/dL (ref 13.0–17.0)
MCH: 31.6 pg (ref 26.0–34.0)
MCHC: 33.9 g/dL (ref 30.0–36.0)
MCV: 93.2 fL (ref 78.0–100.0)
PLATELETS: 239 10*3/uL (ref 150–400)
RBC: 5.16 MIL/uL (ref 4.22–5.81)
RDW: 12.7 % (ref 11.5–15.5)
WBC: 11 10*3/uL — ABNORMAL HIGH (ref 4.0–10.5)

## 2017-09-14 LAB — PROTIME-INR
INR: 1.02
Prothrombin Time: 13.3 seconds (ref 11.4–15.2)

## 2017-09-14 LAB — APTT: APTT: 29 s (ref 24–36)

## 2017-09-14 SURGERY — CORONARY/GRAFT ACUTE MI REVASCULARIZATION
Anesthesia: LOCAL

## 2017-09-14 MED ORDER — IOPAMIDOL (ISOVUE-370) INJECTION 76%
INTRAVENOUS | Status: AC
Start: 2017-09-14 — End: 2017-09-14
  Filled 2017-09-14: qty 125

## 2017-09-14 MED ORDER — SODIUM CHLORIDE 0.9 % IV SOLN
INTRAVENOUS | Status: AC
  Administered 2017-09-14: 06:00:00 via INTRAVENOUS

## 2017-09-14 MED ORDER — FENTANYL CITRATE (PF) 100 MCG/2ML IJ SOLN
INTRAMUSCULAR | Status: DC | PRN
  Administered 2017-09-14 (×2): 25 ug via INTRAVENOUS

## 2017-09-14 MED ORDER — HEPARIN SODIUM (PORCINE) 1000 UNIT/ML IJ SOLN
INTRAMUSCULAR | Status: AC
Start: 2017-09-14 — End: 2017-09-14
  Filled 2017-09-14: qty 1

## 2017-09-14 MED ORDER — HEPARIN SODIUM (PORCINE) 5000 UNIT/ML IJ SOLN
4000.0000 [IU] | Freq: Once | INTRAMUSCULAR | Status: AC
  Administered 2017-09-14: 4000 [IU] via INTRAVENOUS
  Filled 2017-09-14: qty 1

## 2017-09-14 MED ORDER — HEPARIN SODIUM (PORCINE) 1000 UNIT/ML IJ SOLN
INTRAMUSCULAR | Status: DC | PRN
  Administered 2017-09-14: 4500 [IU] via INTRAVENOUS

## 2017-09-14 MED ORDER — LABETALOL HCL 5 MG/ML IV SOLN: 10.0000 mg | INTRAVENOUS | Status: AC | PRN

## 2017-09-14 MED ORDER — NITROGLYCERIN 1 MG/10 ML FOR IR/CATH LAB
INTRA_ARTERIAL | Status: AC
Start: 2017-09-14 — End: 2017-09-14
  Filled 2017-09-14: qty 10

## 2017-09-14 MED ORDER — IOPAMIDOL (ISOVUE-370) INJECTION 76%
INTRAVENOUS | Status: AC
  Filled 2017-09-14: qty 100

## 2017-09-14 MED ORDER — ASPIRIN 81 MG PO CHEW
81.0000 mg | CHEWABLE_TABLET | Freq: Every day | ORAL | Status: DC
  Administered 2017-09-14 – 2017-09-16 (×3): 81 mg via ORAL
  Filled 2017-09-14 (×3): qty 1

## 2017-09-14 MED ORDER — IRBESARTAN 300 MG PO TABS
300.0000 mg | ORAL_TABLET | Freq: Every day | ORAL | Status: DC
  Administered 2017-09-14 – 2017-09-16 (×3): 300 mg via ORAL
  Filled 2017-09-14 (×3): qty 1

## 2017-09-14 MED ORDER — POTASSIUM CHLORIDE 20 MEQ/15ML (10%) PO SOLN
40.0000 meq | Freq: Every day | ORAL | Status: DC
  Administered 2017-09-14 – 2017-09-16 (×3): 40 meq via ORAL
  Filled 2017-09-14 (×3): qty 30

## 2017-09-14 MED ORDER — VERAPAMIL HCL 2.5 MG/ML IV SOLN
INTRAVENOUS | Status: DC | PRN
  Administered 2017-09-14: 10 mL via INTRA_ARTERIAL

## 2017-09-14 MED ORDER — NITROGLYCERIN IN D5W 200-5 MCG/ML-% IV SOLN: 0.0000 ug/min | INTRAVENOUS | Status: DC

## 2017-09-14 MED ORDER — ASPIRIN 81 MG PO CHEW
324.0000 mg | CHEWABLE_TABLET | Freq: Once | ORAL | Status: DC
Start: 2017-09-14 — End: 2017-09-14

## 2017-09-14 MED ORDER — SODIUM CHLORIDE 0.9 % IV SOLN
INTRAVENOUS | Status: DC
  Administered 2017-09-14: 20 mL via INTRAVENOUS

## 2017-09-14 MED ORDER — TICAGRELOR 90 MG PO TABS
ORAL_TABLET | ORAL | Status: DC | PRN
Start: 2017-09-14 — End: 2017-09-14
  Administered 2017-09-14: 180 mg via ORAL

## 2017-09-14 MED ORDER — IOPAMIDOL (ISOVUE-370) INJECTION 76%
INTRAVENOUS | Status: DC | PRN
Start: 2017-09-14 — End: 2017-09-14
  Administered 2017-09-14: 235 mL via INTRA_ARTERIAL

## 2017-09-14 MED ORDER — ROSUVASTATIN CALCIUM 40 MG PO TABS
40.0000 mg | ORAL_TABLET | Freq: Every day | ORAL | Status: DC
  Administered 2017-09-14 – 2017-09-15 (×2): 40 mg via ORAL
  Filled 2017-09-14: qty 2
  Filled 2017-09-14: qty 1
  Filled 2017-09-14: qty 2
  Filled 2017-09-14: qty 1

## 2017-09-14 MED ORDER — HEPARIN (PORCINE) IN NACL 2-0.9 UNIT/ML-% IJ SOLN
INTRAMUSCULAR | Status: AC
  Filled 2017-09-14: qty 1000

## 2017-09-14 MED ORDER — HEPARIN (PORCINE) IN NACL 2-0.9 UNIT/ML-% IJ SOLN
INTRAMUSCULAR | Status: AC
  Filled 2017-09-14: qty 500

## 2017-09-14 MED ORDER — LIDOCAINE HCL (PF) 1 % IJ SOLN
INTRAMUSCULAR | Status: AC
Start: 2017-09-14 — End: 2017-09-14
  Filled 2017-09-14: qty 30

## 2017-09-14 MED ORDER — MIDAZOLAM HCL 2 MG/2ML IJ SOLN
INTRAMUSCULAR | Status: AC
  Filled 2017-09-14: qty 2

## 2017-09-14 MED ORDER — HEPARIN (PORCINE) IN NACL 2-0.9 UNIT/ML-% IJ SOLN
INTRAMUSCULAR | Status: AC | PRN
  Administered 2017-09-14: 1000 mL via INTRA_ARTERIAL
  Administered 2017-09-14: 500 mL via INTRA_ARTERIAL

## 2017-09-14 MED ORDER — TICAGRELOR 90 MG PO TABS
ORAL_TABLET | ORAL | Status: AC
  Filled 2017-09-14: qty 2

## 2017-09-14 MED ORDER — FENTANYL CITRATE (PF) 100 MCG/2ML IJ SOLN
INTRAMUSCULAR | Status: AC
  Filled 2017-09-14: qty 2

## 2017-09-14 MED ORDER — ACETAMINOPHEN 325 MG PO TABS: 650.0000 mg | ORAL_TABLET | ORAL | Status: DC | PRN

## 2017-09-14 MED ORDER — SODIUM CHLORIDE 0.9% FLUSH
3.0000 mL | Freq: Two times a day (BID) | INTRAVENOUS | Status: DC
  Administered 2017-09-14 – 2017-09-16 (×5): 3 mL via INTRAVENOUS

## 2017-09-14 MED ORDER — CARVEDILOL 3.125 MG PO TABS
3.1250 mg | ORAL_TABLET | Freq: Two times a day (BID) | ORAL | Status: DC
  Administered 2017-09-14 – 2017-09-16 (×3): 3.125 mg via ORAL
  Filled 2017-09-14 (×3): qty 1

## 2017-09-14 MED ORDER — DOCUSATE SODIUM 100 MG PO CAPS
100.0000 mg | ORAL_CAPSULE | Freq: Two times a day (BID) | ORAL | Status: DC
  Administered 2017-09-14 – 2017-09-16 (×4): 100 mg via ORAL
  Filled 2017-09-14 (×4): qty 1

## 2017-09-14 MED ORDER — VERAPAMIL HCL 2.5 MG/ML IV SOLN
INTRAVENOUS | Status: AC
  Filled 2017-09-14: qty 2

## 2017-09-14 MED ORDER — POTASSIUM CHLORIDE CRYS ER 20 MEQ PO TBCR
40.0000 meq | EXTENDED_RELEASE_TABLET | Freq: Once | ORAL | Status: AC
  Administered 2017-09-14: 40 meq via ORAL
  Filled 2017-09-14: qty 2

## 2017-09-14 MED ORDER — HYDRALAZINE HCL 20 MG/ML IJ SOLN: 5.0000 mg | INTRAMUSCULAR | Status: AC | PRN

## 2017-09-14 MED ORDER — SODIUM CHLORIDE 0.9% FLUSH: 3.0000 mL | INTRAVENOUS | Status: DC | PRN

## 2017-09-14 MED ORDER — TICAGRELOR 90 MG PO TABS
90.0000 mg | ORAL_TABLET | Freq: Two times a day (BID) | ORAL | Status: DC
  Administered 2017-09-14 – 2017-09-16 (×4): 90 mg via ORAL
  Filled 2017-09-14 (×4): qty 1

## 2017-09-14 MED ORDER — ONDANSETRON HCL 4 MG/2ML IJ SOLN: 4.0000 mg | Freq: Four times a day (QID) | INTRAMUSCULAR | Status: DC | PRN

## 2017-09-14 MED ORDER — ASPIRIN 81 MG PO CHEW
324.0000 mg | CHEWABLE_TABLET | Freq: Once | ORAL | Status: AC
  Administered 2017-09-14: 324 mg via ORAL
  Filled 2017-09-14: qty 4

## 2017-09-14 MED ORDER — LIDOCAINE HCL (PF) 1 % IJ SOLN
INTRAMUSCULAR | Status: DC | PRN
  Administered 2017-09-14: 2 mL via INTRADERMAL

## 2017-09-14 MED ORDER — IOPAMIDOL (ISOVUE-370) INJECTION 76%
INTRAVENOUS | Status: AC
  Filled 2017-09-14: qty 50

## 2017-09-14 MED ORDER — MIDAZOLAM HCL 2 MG/2ML IJ SOLN
INTRAMUSCULAR | Status: DC | PRN
Start: 2017-09-14 — End: 2017-09-14
  Administered 2017-09-14: 1 mg via INTRAVENOUS

## 2017-09-14 MED ORDER — SODIUM CHLORIDE 0.9 % IV SOLN: 250.0000 mL | INTRAVENOUS | Status: DC | PRN

## 2017-09-14 SURGICAL SUPPLY — 20 items
BALLN SAPPHIRE 3.0X15 (BALLOONS) ×2
BALLN ~~LOC~~ EUPHORA RX 4.5X15 (BALLOONS) ×2
BALLOON SAPPHIRE 3.0X15 (BALLOONS) IMPLANT
BALLOON ~~LOC~~ EUPHORA RX 4.5X15 (BALLOONS) IMPLANT
CATH INFINITI 5FR ANG PIGTAIL (CATHETERS) ×1 IMPLANT
CATH OPTITORQUE TIG 4.0 5F (CATHETERS) ×1 IMPLANT
CATH VISTA GUIDE 6FR XBLAD3.5 (CATHETERS) ×1 IMPLANT
DEVICE RAD COMP TR BAND LRG (VASCULAR PRODUCTS) ×1 IMPLANT
GLIDESHEATH SLEND A-KIT 6F 22G (SHEATH) ×1 IMPLANT
GUIDEWIRE INQWIRE 1.5J.035X260 (WIRE) IMPLANT
INQWIRE 1.5J .035X260CM (WIRE) ×2
KIT ENCORE 26 ADVANTAGE (KITS) ×1 IMPLANT
KIT HEART LEFT (KITS) ×2 IMPLANT
PACK CARDIAC CATHETERIZATION (CUSTOM PROCEDURE TRAY) ×2 IMPLANT
STENT SIERRA 4.00 X 12 MM (Permanent Stent) ×1 IMPLANT
STENT SIERRA 4.00 X 38 MM (Permanent Stent) ×1 IMPLANT
SYR MEDRAD MARK V 150ML (SYRINGE) ×2 IMPLANT
TRANSDUCER W/STOPCOCK (MISCELLANEOUS) ×2 IMPLANT
TUBING CIL FLEX 10 FLL-RA (TUBING) ×2 IMPLANT
WIRE ASAHI PROWATER 180CM (WIRE) ×1 IMPLANT

## 2017-09-14 NOTE — ED Notes (Signed)
Pt ready to go to the cath lab

## 2017-09-14 NOTE — ED Notes (Signed)
STEMI paged per dr Ellender Hose

## 2017-09-14 NOTE — ED Triage Notes (Signed)
Woke from sleep with central chest pressure.  Took rolaids with some relief but still feels the pain.

## 2017-09-14 NOTE — Progress Notes (Signed)
  Echocardiogram 2D Echocardiogram has been performed.  Travis Key Travis Key Travis Key 09/14/2017, 1:34 PM

## 2017-09-14 NOTE — ED Notes (Signed)
The pt is c/o chest pain only minimally

## 2017-09-14 NOTE — H&P (Signed)
Cardiology History & Physical    [name] [name]: [id number], DOB: [birth year] Date of Encounter: 09/14/2017, 4:16 AM Primary Physician: Default, Provider, MD  Chief Complaint: STEMI   HPI: Travis Key is a 76 y.o. male with history of HTN, HLD who presents with chest pain.  Pt was doing well until 1 day PTA, when he noted the onset of SSCP.  He denied associated SOB, N/V, or excessive diaphoresis.  Due to persistence of the chest pain despite antacids, he presented to the emergency department for evaluation.  In the ED, initial ECG showed q waves and subtle STE elevation in V5-V6.  There was borderline reciprocal STD in the inferior leads.  He had posterior leads also with subtle STE in V7-V9 without any clear STD in V1-V3.  On serial tracings, his ECG changes were dynamic not consistent.  The patient was hemodynamically stable in the ED and continued to endorse ongoing 3/10 chest pain.  As such, he was taken to the cath lab for emergent coronary angiography.  Past Medical History:  Diagnosis Date  . Adenomatous polyp of colon 08/2003  . Anal fissure   . Diverticulosis   . High cholesterol   . Hypertension   . Internal hemorrhoids      Surgical History:  Past Surgical History:  Procedure Laterality Date  . ROTATOR CUFF REPAIR Right   . UMBILICAL HERNIA REPAIR       Home Meds: Prior to Admission medications   Medication Sig Start Date End Date Taking? Authorizing Provider  aspirin 81 MG tablet Take 81 mg by mouth daily.    [provider]  docusate sodium (COLACE) 100 MG capsule Take 1 capsule (100 mg total) by mouth every 12 (twelve) hours. Patient not taking: Reported on 03/14/2015 01/02/15   Lacretia Leigh, MD  hydrochlorothiazide (HYDRODIURIL) 25 MG tablet Take 25 mg by mouth daily.    [provider]  irbesartan (AVAPRO) 300 MG tablet Take 300 mg by mouth daily.    [provider]  rosuvastatin (CRESTOR) 20 MG tablet Take 20 mg by mouth at  bedtime.     [provider]  VITAMIN D, CHOLECALCIFEROL, PO Take 1 tablet by mouth as needed.     [provider]    Allergies: No Known Allergies  Social History   Socioeconomic History  . Marital status: Widowed    Spouse name: Not on file  . Number of children: 4  . Years of education: Not on file  . Highest education level: Not on file  Social Needs  . Financial resource strain: Not on file  . Food insecurity - worry: Not on file  . Food insecurity - inability: Not on file  . Transportation needs - medical: Not on file  . Transportation needs - non-medical: Not on file  Occupational History  . Occupation: retired Airline pilot  Tobacco Use  . Smoking status: Former Smoker    Types: Cigarettes    Last attempt to quit: 03/02/1985    Years since quitting: 32.5  . Smokeless tobacco: Never Used  Substance and Sexual Activity  . Alcohol use: No    Alcohol/week: 0.0 oz  . Drug use: No  . Sexual activity: Not on file  Other Topics Concern  . Not on file  Social History Narrative  . Not on file     Family History  Problem Relation Age of Onset  . Diabetes Mother   . Liver disease Mother  Review of Systems: All other systems reviewed and are otherwise negative except as noted above.  Labs:   Lab Results  Component Value Date   WBC 11.0 (H) 09/14/2017   HGB 16.3 09/14/2017   HCT 48.1 09/14/2017   MCV 93.2 09/14/2017   PLT 239 09/14/2017   No results for input(s): NA, K, CL, CO2, BUN, CREATININE, CALCIUM, PROT, BILITOT, ALKPHOS, ALT, AST, GLUCOSE in the last 168 hours.  Invalid input(s): LABALBU No results for input(s): CKTOTAL, CKMB, TROPONINI in the last 72 hours. No results found for: CHOL, HDL, LDLCALC, TRIG No results found for: DDIMER  Radiology/Studies:  Dg Chest Port 1 View  Result Date: 09/14/2017 CLINICAL DATA:  STEMI EXAM: PORTABLE CHEST 1 VIEW COMPARISON:  None. FINDINGS: Elevation of the left diaphragm. No consolidation or  effusion. Cardiomediastinal silhouette within normal limits. No pneumothorax. IMPRESSION: No active disease. Electronically Signed   By: Donavan Foil M.D.   On: 09/14/2017 04:03   Wt Readings from Last 3 Encounters:  09/14/17 95.3 kg (210 lb)  03/14/15 102.5 kg (226 lb)  03/03/15 102.6 kg (226 lb 4 oz)    EKG: As described above.  Physical Exam: Blood pressure (!) 164/92, pulse 87, temperature 98.1 F (36.7 C), temperature source Oral, resp. rate 17, height 5\' 11"  (1.803 m), weight 95.3 kg (210 lb), SpO2 96 %. Body mass index is 29.29 kg/m. General: Well developed, well nourished, in no acute distress. Head: Normocephalic, atraumatic, sclera non-icteric, no xanthomas, nares are without discharge.  Neck: Negative for carotid bruits. JVD not elevated. Lungs: Clear bilaterally to auscultation without wheezes, rales, or rhonchi. Breathing is unlabored. Heart: RRR with S1 S2. No murmurs, rubs, or gallops appreciated. Abdomen: Soft, non-tender, non-distended with normoactive bowel sounds. No hepatomegaly. No rebound/guarding. No obvious abdominal masses. Msk:  Strength and tone appear normal for age. Extremities: No clubbing or cyanosis. No edema.  Distal pedal pulses are 2+ and equal bilaterally. Neuro: Alert and oriented X 3. No focal deficit. No facial asymmetry. Moves all extremities spontaneously. Psych:  Responds to questions appropriately with a normal affect.    Assessment and Plan  57M with history of HTN, HLD who presents with chest pain, found to have subtle lateral and possibly posterior STE, which are dynamic in nature.  He has ongoing chest pain and as such, will plan to take him to the cath lab for emergent coronary angiography.  Signed, Doylene Canning, MD 09/14/2017, 4:16 AM

## 2017-09-14 NOTE — ED Provider Notes (Signed)
[facility] CATH LAB Provider Note   CSN: [id number] Arrival date & time: 09/14/17  0309     History   Chief Complaint Chief Complaint  Patient presents with  . Chest Pain    HPI Travis Key is a 76 y.o. male.  HPI   76 year old male with past medical history as below here with chest pain.  The patient states that he awoke just prior to arrival with a dull, aching, substernal chest pressure.  He has no history of similar episodes.  He felt well before going to bed.  He did have some mild shortness of breath but no diaphoresis with it.  He took Rolaids because he thought it was indigestion and subsequently presents for further evaluation.  Denies any shortness of breath currently.  Denies any recent medication changes.  No drug or alcohol use.  No cocaine use.  Past Medical History:  Diagnosis Date  . Adenomatous polyp of colon 08/2003  . Anal fissure   . Diverticulosis   . High cholesterol   . Hypertension   . Internal hemorrhoids     Patient Active Problem List   Diagnosis Date Noted  . Acute posterior myocardial infarction [facility]) 09/14/2017    Past Surgical History:  Procedure Laterality Date  . ROTATOR CUFF REPAIR Right   . UMBILICAL HERNIA REPAIR         Home Medications    Prior to Admission medications   Medication Sig Start Date End Date Taking? Authorizing Provider  aspirin 81 MG tablet Take 81 mg by mouth daily.    [provider]  docusate sodium (COLACE) 100 MG capsule Take 1 capsule (100 mg total) by mouth every 12 (twelve) hours. Patient not taking: Reported on 03/14/2015 01/02/15   Lacretia Leigh, MD  hydrochlorothiazide (HYDRODIURIL) 25 MG tablet Take 25 mg by mouth daily.    [provider]  irbesartan (AVAPRO) 300 MG tablet Take 300 mg by mouth daily.    [provider]  rosuvastatin (CRESTOR) 20 MG tablet Take 20 mg by mouth at bedtime.     [provider]  VITAMIN D, CHOLECALCIFEROL, PO  Take 1 tablet by mouth as needed.     [provider]    Family History Family History  Problem Relation Age of Onset  . Diabetes Mother   . Liver disease Mother     Social History Social History   Tobacco Use  . Smoking status: Former Smoker    Types: Cigarettes    Last attempt to quit: 03/02/1985    Years since quitting: 32.5  . Smokeless tobacco: Never Used  Substance Use Topics  . Alcohol use: No    Alcohol/week: 0.0 oz  . Drug use: No     Allergies   Patient has no known allergies.   Review of Systems Review of Systems  Constitutional: Positive for fatigue. Negative for chills and fever.  HENT: Negative for congestion and rhinorrhea.   Eyes: Negative for visual disturbance.  Respiratory: Negative for cough, shortness of breath and wheezing.   Cardiovascular: Positive for chest pain. Negative for leg swelling.  Gastrointestinal: Negative for abdominal pain, diarrhea, nausea and vomiting.  Genitourinary: Negative for dysuria and flank pain.  Musculoskeletal: Negative for neck pain and neck stiffness.  Skin: Negative for rash and wound.  Allergic/Immunologic: Negative for immunocompromised state.  Neurological: Negative for syncope, weakness and headaches.  All other systems reviewed and are negative.    Physical Exam Updated Vital Signs  BP (!) 164/92   Pulse 87   Temp 98.1 F (36.7 C) (Oral)   Resp 17   Ht 5\' 11"  (1.803 m)   Wt 95.3 kg (210 lb)   SpO2 96%   BMI 29.29 kg/m   Physical Exam  Constitutional: He is oriented to person, place, and time. He appears well-developed and well-nourished. No distress.  HENT:  Head: Normocephalic and atraumatic.  Eyes: Conjunctivae are normal.  Neck: Neck supple.  Cardiovascular: Normal rate, regular rhythm and normal heart sounds. Exam reveals no friction rub.  No murmur heard. Pulmonary/Chest: Effort normal and breath sounds normal. No respiratory distress. He has no wheezes. He has no rales.    Abdominal: He exhibits no distension.  Musculoskeletal: He exhibits no edema.  Neurological: He is alert and oriented to person, place, and time. He exhibits normal muscle tone.  Skin: Skin is warm. Capillary refill takes less than 2 seconds.  Psychiatric: He has a normal mood and affect.  Nursing note and vitals reviewed.    ED Treatments / Results  Labs (all labs ordered are listed, but only abnormal results are displayed) Labs Reviewed  CBC - Abnormal; Notable for the following components:      Result Value   WBC 11.0 (*)    All other components within normal limits  TROPONIN I - Abnormal; Notable for the following components:   Troponin I 0.03 (*)    All other components within normal limits  PROTIME-INR  APTT  LIPID PANEL  BASIC METABOLIC PANEL  I-STAT TROPONIN, ED    EKG  EKG Interpretation  Date/Time:  Sunday September 14 2017 04:07:38 EST Ventricular Rate:  101 PR Interval:  164 QRS Duration: 90 QT Interval:  373 QTC Calculation: 462 R Axis:   0 Text Interpretation:  Sinus tachycardia Paired ventricular premature complexes Consider posterior infarct Repol abnrm, severe global ischemia (LM/MVD) Since last EKG, there are persistent diffuse inferolateral ST depressions aVR remains elevated Confirmed by Duffy Bruce [phone]) on 09/14/2017 4:32:30 AM       Radiology Dg Chest Port 1 View  Result Date: 09/14/2017 CLINICAL DATA:  STEMI EXAM: PORTABLE CHEST 1 VIEW COMPARISON:  None. FINDINGS: Elevation of the left diaphragm. No consolidation or effusion. Cardiomediastinal silhouette within normal limits. No pneumothorax. IMPRESSION: No active disease. Electronically Signed   By: Donavan Foil M.D.   On: 09/14/2017 04:03    Procedures .Critical Care Performed by: Duffy Bruce, MD Authorized by: Duffy Bruce, MD   Critical care provider statement:    Critical care time (minutes):  35   Critical care time was exclusive of:  Separately billable procedures and  treating other patients and teaching time   Critical care was necessary to treat or prevent imminent or life-threatening deterioration of the following conditions:  Circulatory failure and cardiac failure   Critical care was time spent personally by me on the following activities:  Development of treatment plan with patient or surrogate, discussions with consultants, evaluation of patient's response to treatment, examination of patient, obtaining history from patient or surrogate, ordering and performing treatments and interventions, ordering and review of laboratory studies, ordering and review of radiographic studies, pulse oximetry, re-evaluation of patient's condition and review of old charts   I assumed direction of critical care for this patient from another provider in my specialty: no     (including critical care time)  Medications Ordered in ED Medications  0.9 %  sodium chloride infusion (20 mLs Intravenous New Bag/Given 09/14/17 0402)  heparin infusion 2 units/mL in 0.9 % sodium chloride (500 mLs Intra-arterial New Bag/Given 09/14/17 0449)  midazolam (VERSED) injection (1 mg Intravenous Given 09/14/17 0446)  fentaNYL (SUBLIMAZE) injection (25 mcg Intravenous Given 09/14/17 0446)  lidocaine (PF) (XYLOCAINE) 1 % injection (2 mLs Intradermal Given 09/14/17 0446)  heparin injection (4,500 Units Intravenous Given 09/14/17 0459)  ticagrelor (BRILINTA) tablet (180 mg Oral Given 09/14/17 0502)  aspirin chewable tablet 324 mg (324 mg Oral Given 09/14/17 0357)  heparin injection 4,000 Units (4,000 Units Intravenous Given 09/14/17 0403)     Initial Impression / Assessment and Plan / ED Course  I have reviewed the triage vital signs and the nursing notes.  Pertinent labs & imaging results that were available during my care of the patient were reviewed by me and considered in my medical decision making (see chart for details).     76 year old male here with substernal chest pressure.  Patient has  diffuse ST depressions on EKG, and I have asked tech to obtain a posterior EKG.  On review of the posterior EKG, he does seem to have some subtle ST elevations posteriorly.  This is somewhat atypical given that V1 and V2 did not have depressions, but findings are concerning for possible acute ischemia with possible early ST elevation MI.  He also has ST elevation in aVR, so left main disease with diffuse subendocardial injury is also on the differential.  I had a long discussion with Dr. Ellyn Hack of cardiology.  The cardiology fellow has also evaluated the patient.  Given his ongoing symptoms and concerning EKGs, he will be taken emergently to the Cath Lab.  He was given an aspirin and heparin.  Pain is not consistent with dissection or PE.  Final Clinical Impressions(s) / ED Diagnoses   Final diagnoses:  ST elevation myocardial infarction (STEMI), unspecified artery [facility])    ED Discharge Orders    None       Duffy Bruce, MD 09/14/17 [phone]

## 2017-09-14 NOTE — Interval H&P Note (Signed)
History and Physical Interval Note:  09/14/2017 4:34 AM  Travis Key  has presented today for surgery, with the diagnosis of Likely Posterior STEMI    Clinical presentation is c/w ACS. EKG with frequent PVCs & diffuse subtle inferior & septal ST depression - borderline STE in V5 & V6 with STE ~1 mm in V7-V9 on Posterior EKG. After careful evaluation of several EKGs, given the clinical scenario & Frequent PVCs, I agree that we cannot exclude Posterior STEMI.  Code STEMI was delayed by multiple EKG evaluations required to come to this Dx.   The various methods of treatment have been discussed with the patient and family. After consideration of risks, benefits and other options for treatment, the patient has consented to  Procedure(s): Coronary/Graft Acute MI Revascularization (N/A) LEFT HEART CATH AND CORONARY ANGIOGRAPHY (N/A) with possible PERCUTANEOUS CORONARY INTERVENTION as a surgical intervention .  The patient's history has been reviewed, patient examined, no change in status, stable for surgery.  I have reviewed the patient's chart and labs.  Questions were answered to the patient's satisfaction.     Glenetta Hew

## 2017-09-14 NOTE — ED Notes (Signed)
Cardiology fellow at bedside

## 2017-09-14 NOTE — Progress Notes (Signed)
Progress Note  Patient Name: Travis Key Date of Encounter: 09/14/2017  Primary Cardiologist: Dr. Ellyn Hack  Subjective   No chest pain or sob.   Inpatient Medications    Scheduled Meds: . aspirin  81 mg Oral Daily  . carvedilol  3.125 mg Oral BID WC  . docusate sodium  100 mg Oral Q12H  . irbesartan  300 mg Oral Daily  . potassium chloride  40 mEq Oral Daily  . rosuvastatin  40 mg Oral QHS  . sodium chloride flush  3 mL Intravenous Q12H  . ticagrelor  90 mg Oral BID   Continuous Infusions: . sodium chloride 100 mL/hr at 09/14/17 0626  . sodium chloride    . nitroGLYCERIN Stopped (09/14/17 0908)   PRN Meds: sodium chloride, acetaminophen, hydrALAZINE, labetalol, ondansetron (ZOFRAN) IV, sodium chloride flush   Vital Signs    Vitals:   09/14/17 0609 09/14/17 0619 09/14/17 0659 09/14/17 0700  BP:  (!) 140/54  (!) 143/75  Pulse: (!) 0  72 67  Resp: (!) 0 20  17  Temp:  98.4 F (36.9 C)  98 F (36.7 C)  TempSrc:  Oral  Oral  SpO2: (!) 0%   95%  Weight:  219 lb 9.3 oz (99.6 kg)    Height:  5\' 11"  (1.803 m)     No intake or output data in the 24 hours ending 09/14/17 0946 Filed Weights   09/14/17 0324 09/14/17 0619  Weight: 210 lb (95.3 kg) 219 lb 9.3 oz (99.6 kg)    Telemetry    nsr - Personally Reviewed  ECG    nsr with PVC's - Personally Reviewed  Physical Exam   GEN: No acute distress.   Neck: 7 cm JVD Cardiac: RRR, no murmurs, rubs, or gallops.  Respiratory: Clear to auscultation bilaterally. GI: Soft, nontender, non-distended  MS: No edema; No deformity. Neuro:  Nonfocal  Psych: Normal affect   Labs    Chemistry Recent Labs  Lab 09/14/17 0330  NA 140  K 2.8*  CL 101  CO2 24  GLUCOSE 119*  BUN 13  CREATININE 1.30*  CALCIUM 9.8  GFRNONAA 52*  GFRAA 60*  ANIONGAP 15     Hematology Recent Labs  Lab 09/14/17 0330  WBC 11.0*  RBC 5.16  HGB 16.3  HCT 48.1  MCV 93.2  MCH 31.6  MCHC 33.9  RDW 12.7  PLT 239    Cardiac  Enzymes Recent Labs  Lab 09/14/17 0330 09/14/17 0618  TROPONINI 0.03* 0.65*    Recent Labs  Lab 09/14/17 0339  TROPIPOC 0.02     BNPNo results for input(s): BNP, PROBNP in the last 168 hours.   DDimer No results for input(s): DDIMER in the last 168 hours.   Radiology    Dg Chest Port 1 View  Result Date: 09/14/2017 CLINICAL DATA:  STEMI EXAM: PORTABLE CHEST 1 VIEW COMPARISON:  None. FINDINGS: Elevation of the left diaphragm. No consolidation or effusion. Cardiomediastinal silhouette within normal limits. No pneumothorax. IMPRESSION: No active disease. Electronically Signed   By: Donavan Foil M.D.   On: 09/14/2017 04:03    Cardiac Studies   S/p  PCI stenting of the LAD  Patient Profile     76 y.o. male admitted with acute coronary syndrome, s/p PCI LAD  Assessment & Plan    1. NSTEMI - he is s/p PCI of LAD doing well. Continue DAPT 2. HTN - his blood pressure is reasonably well controlled. Will continue beta blocker and ARB  3. Hypokalemia - replete  For questions or updates, please contact [place] Please consult [email or web address] for contact info under Cardiology/STEMI.      Signed, Cristopher Peru, MD  09/14/2017, 9:46 AM  [name], male   DOB: Aug 16, 1941, 76 y.o.   [name]: [id number]

## 2017-09-15 ENCOUNTER — Encounter (HOSPITAL_COMMUNITY): Payer: Self-pay | Admitting: Cardiology

## 2017-09-15 DIAGNOSIS — I10 Essential (primary) hypertension: Secondary | ICD-10-CM

## 2017-09-15 LAB — LIPID PANEL
CHOL/HDL RATIO: 3.4 ratio
CHOLESTEROL: 131 mg/dL (ref 0–200)
HDL: 38 mg/dL — AB (ref >40)
LDL CALC: 75 mg/dL (ref 0–99)
TRIGLYCERIDES: 90 mg/dL (ref <150)
VLDL: 18 mg/dL (ref 0–40)

## 2017-09-15 LAB — BASIC METABOLIC PANEL
ANION GAP: 6 (ref 5–15)
BUN: 10 mg/dL (ref 6–20)
CHLORIDE: 107 mmol/L (ref 101–111)
CO2: 24 mmol/L (ref 22–32)
CREATININE: 1.08 mg/dL (ref 0.61–1.24)
Calcium: 9 mg/dL (ref 8.9–10.3)
GFR calc non Af Amer: >60 mL/min (ref >60)
Glucose, Bld: 119 mg/dL — ABNORMAL HIGH (ref 65–99)
Potassium: 3.7 mmol/L (ref 3.5–5.1)
SODIUM: 137 mmol/L (ref 135–145)

## 2017-09-15 LAB — CBC
HEMATOCRIT: 44 % (ref 39.0–52.0)
HEMOGLOBIN: 14.7 g/dL (ref 13.0–17.0)
MCH: 31.3 pg (ref 26.0–34.0)
MCHC: 33.4 g/dL (ref 30.0–36.0)
MCV: 93.8 fL (ref 78.0–100.0)
Platelets: 216 10*3/uL (ref 150–400)
RBC: 4.69 MIL/uL (ref 4.22–5.81)
RDW: 13 % (ref 11.5–15.5)
WBC: 11.3 10*3/uL — AB (ref 4.0–10.5)

## 2017-09-15 LAB — TROPONIN I: Troponin I: 12.83 ng/mL (ref <0.03)

## 2017-09-15 NOTE — Plan of Care (Signed)
Pt progressing as expected 

## 2017-09-15 NOTE — Progress Notes (Signed)
Progress Note  Patient Name: Travis Key Date of Encounter: 09/15/2017  Primary Cardiologist: New- Glenetta Hew MD  Subjective   Feels very well. No chest pain or SOB.   Inpatient Medications    Scheduled Meds: . aspirin  81 mg Oral Daily  . carvedilol  3.125 mg Oral BID WC  . docusate sodium  100 mg Oral Q12H  . irbesartan  300 mg Oral Daily  . potassium chloride  40 mEq Oral Daily  . rosuvastatin  40 mg Oral QHS  . sodium chloride flush  3 mL Intravenous Q12H  . ticagrelor  90 mg Oral BID   Continuous Infusions: . sodium chloride    . nitroGLYCERIN Stopped (09/14/17 0908)   PRN Meds: sodium chloride, acetaminophen, ondansetron (ZOFRAN) IV, sodium chloride flush   Vital Signs    Vitals:   09/15/17 0600 09/15/17 0700 09/15/17 0800 09/15/17 0812  BP: 126/68 111/77 117/71   Pulse: (!) 48 (!) 46 (!) 52   Resp: (!) 22 20 15    Temp:    98.2 F (36.8 C)  TempSrc:    Oral  SpO2: 94% 94% 94%   Weight:      Height:        Intake/Output Summary (Last 24 hours) at 09/15/2017 1016 Last data filed at 09/15/2017 0245 Gross per 24 hour  Intake 480 ml  Output 400 ml  Net 80 ml   Filed Weights   09/14/17 0324 09/14/17 0619  Weight: 210 lb (95.3 kg) 219 lb 9.3 oz (99.6 kg)    Telemetry    NSR/sinus brady - Personally Reviewed  ECG    NSR, TWA c/w anterolateral ischemia - Personally Reviewed  Physical Exam   GEN: No acute distress.   Neck: No JVD Cardiac: RRR, no murmurs, rubs, or gallops.  Respiratory: Clear to auscultation bilaterally. GI: Soft, nontender, non-distended  MS: No edema; No deformity. Radial site without hematoma Neuro:  Nonfocal  Psych: Normal affect   Labs    Chemistry Recent Labs  Lab 09/14/17 0330 09/15/17 0012  NA 140 137  K 2.8* 3.7  CL 101 107  CO2 24 24  GLUCOSE 119* 119*  BUN 13 10  CREATININE 1.30* 1.08  CALCIUM 9.8 9.0  GFRNONAA 52* >60  GFRAA 60* >60  ANIONGAP 15 6     Hematology Recent Labs  Lab 09/14/17 0330  09/15/17 0012  WBC 11.0* 11.3*  RBC 5.16 4.69  HGB 16.3 14.7  HCT 48.1 44.0  MCV 93.2 93.8  MCH 31.6 31.3  MCHC 33.9 33.4  RDW 12.7 13.0  PLT 239 216    Cardiac Enzymes Recent Labs  Lab 09/14/17 0618 09/14/17 1412 09/14/17 1847 09/15/17 0012  TROPONINI 0.65* 6.42* 10.47* 12.83*    Recent Labs  Lab 09/14/17 0339  TROPIPOC 0.02     BNPNo results for input(s): BNP, PROBNP in the last 168 hours.   DDimer No results for input(s): DDIMER in the last 168 hours.   Radiology    Dg Chest Port 1 View  Result Date: 09/14/2017 CLINICAL DATA:  STEMI EXAM: PORTABLE CHEST 1 VIEW COMPARISON:  None. FINDINGS: Elevation of the left diaphragm. No consolidation or effusion. Cardiomediastinal silhouette within normal limits. No pneumothorax. IMPRESSION: No active disease. Electronically Signed   By: Donavan Foil M.D.   On: 09/14/2017 04:03    Cardiac Studies   Cardiac cath/PCI: 09/14/17: Conclusion     Prox LAD to Mid LAD lesion is 95% stenosed. (Tandem 80-95% stenosis)  A  drug-eluting stent was successfully placed using a STENT SIERRA 4.00 X 38 MM. --Postdilated to 4.6 mm  A second overlapping drug-eluting stent was successfully placed using a STENT SIERRA 4.00 X 12 MM. --Postdilated to 4.6 mm  Post intervention, there is a 0% residual stenosis.  _______________________________________________  Colon Flattery 2nd Diag lesion is 50% stenosed.  Prox RCA to Mid RCA lesion is 100% stenosed. CTO of nondominant RCA with bridging collaterals  Mid Cx to Dist Cx lesion is 60% stenosed. 1st LPL lesion is 50% stenosed.  There is mild to moderate left ventricular systolic dysfunction. The left ventricular ejection fraction is 45-50% by visual estimate. With apical anterior, apical and inferoapical hypo-a kinesis  LV end diastolic pressure is moderately elevated.   Severe two-vessel disease with almost subtotally occluded proximal mid wraparound LAD as a likely culprit lesion along with a likely  CTO of the still large caliber nondominant right coronary artery.  Moderate disease of the dominant circumflex. Tandem 80 and 95% lesions in the proximal to mid LAD were treated with 2 overlapping DES stents as noted. The subtle EKG changes noted leading to diagnosis of acute posterior infarct is likely related to this being a wraparound LAD that was potentially intermittently occlusive.    Plan:  Admit to CCU for monitoring and TR band removal.  Dual antiplatelet therapy for minimum 1 year.  Aggressive risk factor modification: We will increase Crestor dose to 40 mg, convert from HCTZ to carvedilol and continue ARB  Check fasting lipid level and A1c  We will order 2D echocardiogram for baseline.  Depending on how he does clinically, consider fast-track discharge    Glenetta Hew, M.D., M.S. Interventional Cardiologist    Echo: Study Conclusions  - Left ventricle: The cavity size was normal. Wall thickness was   increased in a pattern of moderate LVH. Systolic function was   normal. The estimated ejection fraction was in the range of 50%   to 55%. Moderate hypokinesis of the mid-apicalanteroseptal   myocardium. - Aortic valve: Trileaflet; normal thickness, mildly calcified   leaflets. - Left atrium: The atrium was moderately dilated.  Patient Profile     76 y.o. male with history of HTN and HLD presents with an anterior STEMI  Assessment & Plan    1. Anterior STEMI. S/p PCI of the LAD with DES x 2. Peak troponin 12.83. Ecg with persistent T wave inversion in anterolateral leads. Echo shows preserved LV function with EF 50-55% and anterior HK. Patient also has CTO of the RCA with good bridging right to right collaterals. Continue DAPT. Ambulate. Transfer to telemetry. Anticipate DC in am. 2. HTN well controlled. 3. HLD on high dose Crestor.   For questions or updates, please contact [place] Please consult [email or web address] for contact info under  Cardiology/STEMI.      Signed, Peter Martinique, MD  09/15/2017, 10:16 AM

## 2017-09-15 NOTE — Progress Notes (Signed)
Per insurance check for Brilinta  Estimated cost for Brilinta 90 mg. Bid 30.00 - 42.00 for 30 day supply.  No-PA required.  In-network pharmacy is [facility] .

## 2017-09-15 NOTE — Progress Notes (Signed)
CRITICAL VALUE ALERT  Critical Value:  Trop 12.83  Date & Time Notied:  09/15/2017 0345  Provider Notified: MD Kenton Kingfisher on call card fellow  Orders Received/Actions taken: None at this time, will continue to monitor patient. Patient c/o of no chest pain through night, VSS. MD also made aware of HR 47-50's at rest.

## 2017-09-15 NOTE — Progress Notes (Signed)
CARDIAC REHAB PHASE I   PRE:  Rate/Rhythm: 35 SR c/ PVCs  BP:  Sitting: 120/60        SaO2: 96 RA  MODE:  Ambulation: 370 ft   POST:  Rate/Rhythm: 65 SR  BP:  Sitting: 149/80         SaO2: 95 RA  Pt ambulated 370 ft on RA, independent, steady gait, tolerated well with no complaints. Completed MI/stent education with pt and pt's daughters at bedside.  Reviewed risk factors, MI book, anti-platelet therapy, stent card, activity restrictions, ntg, exercise, heart healthy diet and phase 2 cardiac rehab. Pt verbalized understanding. Pt agrees to phase 2 cardiac rehab referral, will send to [facility] per pt request. Pt to see case manager regarding brilinta prior to discharge. Pt to recliner after walk, call bell within reach. Will follow.   [phone] Lenna Sciara, RN, BSN 09/15/2017 11:18 AM

## 2017-09-16 ENCOUNTER — Encounter (HOSPITAL_COMMUNITY): Payer: Self-pay | Admitting: Cardiology

## 2017-09-16 DIAGNOSIS — E785 Hyperlipidemia, unspecified: Secondary | ICD-10-CM

## 2017-09-16 DIAGNOSIS — I10 Essential (primary) hypertension: Secondary | ICD-10-CM

## 2017-09-16 LAB — HEMOGLOBIN A1C
Hgb A1c MFr Bld: 5.5 % (ref 4.8–5.6)
Mean Plasma Glucose: 111 mg/dL

## 2017-09-16 MED ORDER — ROSUVASTATIN CALCIUM 40 MG PO TABS: 20.0000 mg | ORAL_TABLET | Freq: Every day | ORAL | 1 refills | Status: DC

## 2017-09-16 MED ORDER — ASPIRIN 81 MG PO CHEW: 81.0000 mg | CHEWABLE_TABLET | Freq: Every day | ORAL | Status: DC

## 2017-09-16 MED ORDER — TICAGRELOR 90 MG PO TABS
90.0000 mg | ORAL_TABLET | Freq: Two times a day (BID) | ORAL | 2 refills | Status: DC
Start: 2017-09-16 — End: 2018-09-05

## 2017-09-16 MED ORDER — CARVEDILOL 3.125 MG PO TABS: 3.1250 mg | ORAL_TABLET | Freq: Two times a day (BID) | ORAL | 3 refills | Status: DC

## 2017-09-16 MED ORDER — NITROGLYCERIN 0.4 MG SL SUBL: 0.4000 mg | SUBLINGUAL_TABLET | SUBLINGUAL | 2 refills | Status: DC | PRN

## 2017-09-16 NOTE — Discharge Summary (Signed)
Discharge Summary    [name],  [name]: [id number], DOB/AGE: 03-17-1941 76 y.o.  Admit date: 09/14/2017 Discharge date: 09/16/2017  Primary Care Provider: Default, Provider Primary Cardiologist: Ellyn Hack   Discharge Diagnoses    Principal Problem:   Acute posterior myocardial infarction [facility]) Active Problems:   Acute ST elevation myocardial infarction (STEMI) of posterior wall ([place])   Hypertension   Hyperlipidemia   Allergies No Known Allergies  Diagnostic Studies/Procedures    Cardiac cath/PCI: 09/14/17: Conclusion     Prox LAD to Mid LAD lesion is 95% stenosed. (Tandem 80-95% stenosis)  A drug-eluting stent was successfully placed using a STENT SIERRA 4.00 X 38 MM. --Postdilated to 4.6 mm  A second overlapping drug-eluting stent was successfully placed using a STENT SIERRA 4.00 X 12 MM. --Postdilated to 4.6 mm  Post intervention, there is a 0% residual stenosis.  _______________________________________________  Colon Flattery 2nd Diag lesion is 50% stenosed.  Prox RCA to Mid RCA lesion is 100% stenosed. CTO of nondominant RCA with bridging collaterals  Mid Cx to Dist Cx lesion is 60% stenosed. 1st LPL lesion is 50% stenosed.  There is mild to moderate left ventricular systolic dysfunction. The left ventricular ejection fraction is 45-50% by visual estimate. With apical anterior, apical and inferoapical hypo-a kinesis  LV end diastolic pressure is moderately elevated.  Severe two-vessel disease with almost subtotally occluded proximal mid wraparound LAD as a likely culprit lesion along with a likely CTO of the still large caliber nondominant right coronary artery. Moderate disease of the dominant circumflex. Tandem 80 and 95% lesions in the proximal to mid LAD were treated with 2 overlapping DES stents as noted. The subtle EKG changes noted leading to diagnosis of acute posterior infarct is likely related to this being a wraparound LAD that was potentially  intermittently occlusive.   Plan:  Admit to CCU for monitoring and TR band removal.  Dual antiplatelet therapy for minimum 1 year.  Aggressive risk factor modification: We will increase Crestor dose to 40 mg, convert from HCTZ to carvedilol and continue ARB  Check fasting lipid level and A1c  We will order 2D echocardiogram for baseline.  Depending on how he does clinically, consider fast-track discharge    Glenetta Hew, M.D., M.S. Interventional Cardiologist    Echo: Study Conclusions  - Left ventricle: The cavity size was normal. Wall thickness was increased in a pattern of moderate LVH. Systolic function was normal. The estimated ejection fraction was in the range of 50% to 55%. Moderate hypokinesis of the mid-apicalanteroseptal myocardium. - Aortic valve: Trileaflet; normal thickness, mildly calcified leaflets. - Left atrium: The atrium was moderately dilated.  _____________   History of Present Illness     76 y.o. male with history of HTN, HLD who presents with chest pain.  Pt was doing well until 1 day PTA, when he noted the onset of SSCP.  He denied associated SOB, N/V, or excessive diaphoresis.  Due to persistence of the chest pain despite antacids, he presented to the emergency department for evaluation.  In the ED, initial ECG showed q waves and subtle STE elevation in V5-V6.  There was borderline reciprocal ST depression in the inferior leads.  He had posterior leads also with subtle STE in V7-V9 without any clear STD in V1-V3.  On serial tracings, his ECG changes were dynamic not consistent.  The patient was hemodynamically stable in the ED and continued to endorse ongoing 3/10 chest pain.  As such, he was taken  to the cath lab for emergent coronary angiography.  Hospital Course     Underwent cardiac cath noted above with PCI/DESx1 to mLAD. Plan for DAPT with ASA/Brilinta. Also noted a CTO to the RCA with collaterals. His statin was increased  from Crestor 20mg  to 40mg  daily. Follow up echo showed EF of 76-555 with hypokinesis in the mid-apicalanteroseptal region. Troponin peaked at 12.83. Hgb A1c 5.5, LDL 75. EKG noted persistent TWI in anterolateral leads. Worked well with cardiac rehab. Added coreg with blood pressures tolerating well. He was started on his home ARB with stable labs.  [name] was seen by Dr. Martinique and determined stable for discharge home. Follow up in the office has been arranged. Medications are listed below.   _____________  Discharge Vitals Blood pressure 129/71, pulse (!) 55, temperature 97.9 F (36.6 C), temperature source Oral, resp. rate (!) 21, height 5\' 11"  (1.803 m), weight 219 lb 9.3 oz (99.6 kg), SpO2 95 %.  Filed Weights   09/14/17 0324 09/14/17 0619  Weight: 210 lb (95.3 kg) 219 lb 9.3 oz (99.6 kg)    Labs & Radiologic Studies    CBC Recent Labs    09/14/17 0330 09/15/17 0012  WBC 11.0* 11.3*  HGB 16.3 14.7  HCT 48.1 44.0  MCV 93.2 93.8  PLT 239 950   Basic Metabolic Panel Recent Labs    09/14/17 0330 09/15/17 0012  NA 140 137  K 2.8* 3.7  CL 101 107  CO2 24 24  GLUCOSE 119* 119*  BUN 13 10  CREATININE 1.30* 1.08  CALCIUM 9.8 9.0   Liver Function Tests No results for input(s): AST, ALT, ALKPHOS, BILITOT, PROT, ALBUMIN in the last 72 hours. No results for input(s): LIPASE, AMYLASE in the last 72 hours. Cardiac Enzymes Recent Labs    09/14/17 1412 09/14/17 1847 09/15/17 0012  TROPONINI 6.42* 10.47* 12.83*   BNP Invalid input(s): POCBNP D-Dimer No results for input(s): DDIMER in the last 72 hours. Hemoglobin A1C Recent Labs    09/15/17 0012  HGBA1C 5.5   Fasting Lipid Panel Recent Labs    09/15/17 0012  CHOL 131  HDL 38*  LDLCALC 75  TRIG 90  CHOLHDL 3.4   Thyroid Function Tests No results for input(s): TSH, T4TOTAL, T3FREE, THYROIDAB in the last 72 hours.  Invalid input(s): FREET3 _____________  Dg Chest Port 1 View  Result Date:  09/14/2017 CLINICAL DATA:  STEMI EXAM: PORTABLE CHEST 1 VIEW COMPARISON:  None. FINDINGS: Elevation of the left diaphragm. No consolidation or effusion. Cardiomediastinal silhouette within normal limits. No pneumothorax. IMPRESSION: No active disease. Electronically Signed   By: Donavan Foil M.D.   On: 09/14/2017 04:03   Disposition   Pt is being discharged home today in good condition.  Follow-up Plans & Appointments    Follow-up Information    Almyra Deforest, [place] Follow up on 09/23/2017.   Specialties:  Cardiology, Radiology Why:  at 10am for your follow up appt.  Contact information: [address] [place] [address] [phone]          Discharge Instructions    Amb Referral to Cardiac Rehabilitation   Complete by:  As directed    Diagnosis:   Coronary Stents STEMI     Diet - low sodium heart healthy   Complete by:  As directed    Discharge instructions   Complete by:  As directed    Groin Site Care Refer to this sheet in the next few weeks. These instructions  provide you with information on caring for yourself after your procedure. Your caregiver may also give you more specific instructions. Your treatment has been planned according to current medical practices, but problems sometimes occur. Call your caregiver if you have any problems or questions after your procedure. HOME CARE INSTRUCTIONS You may shower 24 hours after the procedure. Remove the bandage (dressing) and gently wash the site with plain soap and water. Gently pat the site dry.  Do not apply powder or lotion to the site.  Do not sit in a bathtub, swimming pool, or whirlpool for 5 to 7 days.  No bending, squatting, or lifting anything over 10 pounds (4.5 kg) as directed by your caregiver.  Inspect the site at least twice daily.  Do not drive home if you are discharged the same day of the procedure. Have someone else drive you.  You may drive 24 hours after the procedure unless otherwise  instructed by your caregiver.  What to expect: Any bruising will usually fade within 1 to 2 weeks.  Blood that collects in the tissue (hematoma) may be painful to the touch. It should usually decrease in size and tenderness within 1 to 2 weeks.  SEEK IMMEDIATE MEDICAL CARE IF: You have unusual pain at the groin site or down the affected leg.  You have redness, warmth, swelling, or pain at the groin site.  You have drainage (other than a small amount of blood on the dressing).  You have chills.  You have a fever or persistent symptoms for more than 72 hours.  You have a fever and your symptoms suddenly get worse.  Your leg becomes pale, cool, tingly, or numb.  You have heavy bleeding from the site. Hold pressure on the site.   PLEASE DO NOT MISS ANY DOSES OF YOUR BRILINTA!!!!! Also keep a log of you blood pressures and bring back to your follow up appt. Please call the office with any questions.   Patients taking blood thinners should generally stay away from medicines like ibuprofen, Advil, Motrin, naproxen, and Aleve due to risk of stomach bleeding. You may take Tylenol as directed or talk to your primary doctor about alternatives.   Increase activity slowly   Complete by:  As directed       Discharge Medications     Medication List    STOP taking these medications   aspirin 81 MG tablet Replaced by:  aspirin 81 MG chewable tablet   hydrochlorothiazide 25 MG tablet Commonly known as:  HYDRODIURIL     TAKE these medications   aspirin 81 MG chewable tablet Chew 1 tablet (81 mg total) by mouth daily. Start taking on:  09/17/2017 Replaces:  aspirin 81 MG tablet   carvedilol 3.125 MG tablet Commonly known as:  COREG Take 1 tablet (3.125 mg total) by mouth 2 (two) times daily with a meal.   irbesartan 300 MG tablet Commonly known as:  AVAPRO Take 300 mg by mouth daily.   nitroGLYCERIN 0.4 MG SL tablet Commonly known as:  NITROSTAT Place 1 tablet (0.4 mg total) under the  tongue every 5 (five) minutes as needed.   rosuvastatin 40 MG tablet Commonly known as:  CRESTOR Take 0.5 tablets (20 mg total) by mouth at bedtime. What changed:  medication strength   tetrahydrozoline 0.05 % ophthalmic solution Place 1 drop into both eyes daily.   ticagrelor 90 MG Tabs tablet Commonly known as:  BRILINTA Take 1 tablet (90 mg total) by mouth 2 (two) times daily.  Aspirin prescribed at discharge?  Yes High Intensity Statin Prescribed? (Lipitor 40-80mg  or Crestor 20-40mg ): Yes Beta Blocker Prescribed? Yes For EF <40%, was ACEI/ARB Prescribed? Yes ADP Receptor Inhibitor Prescribed? (i.e. Plavix etc.-Includes Medically Managed Patients): Yes For EF <40%, Aldosterone Inhibitor Prescribed? No: EF ok Was EF assessed during THIS hospitalization? Yes Was Cardiac Rehab II ordered? (Included Medically managed Patients): Yes   Outstanding Labs/Studies   FLP/LFTs in 6 weeks if tolerating statin increase.   Duration of Discharge Encounter   Greater than 30 minutes including physician time.  Signed, Reino Bellis NP-C 09/16/2017, 10:45 AM

## 2017-09-16 NOTE — Progress Notes (Signed)
Patient D/C home, discharge instruction and med. education done.

## 2017-09-16 NOTE — Progress Notes (Signed)
Progress Note  Patient Name: Travis Key Date of Encounter: 09/16/2017  Primary Cardiologist: New- Glenetta Hew MD  Subjective   Feels very well. No chest pain or SOB. Ambulating in halls.  Inpatient Medications    Scheduled Meds: . aspirin  81 mg Oral Daily  . carvedilol  3.125 mg Oral BID WC  . docusate sodium  100 mg Oral Q12H  . irbesartan  300 mg Oral Daily  . potassium chloride  40 mEq Oral Daily  . rosuvastatin  40 mg Oral QHS  . sodium chloride flush  3 mL Intravenous Q12H  . ticagrelor  90 mg Oral BID   Continuous Infusions: . sodium chloride     PRN Meds: sodium chloride, acetaminophen, ondansetron (ZOFRAN) IV, sodium chloride flush   Vital Signs    Vitals:   09/16/17 0038 09/16/17 0400 09/16/17 0413 09/16/17 0756  BP: 117/62  129/71   Pulse: (!) 55     Resp: 15  13   Temp:  97.7 F (36.5 C)  97.9 F (36.6 C)  TempSrc:  Oral  Oral  SpO2: 95%  95%   Weight:      Height:        Intake/Output Summary (Last 24 hours) at 09/16/2017 0817 Last data filed at 09/15/2017 2000 Gross per 24 hour  Intake 150 ml  Output -  Net 150 ml   Filed Weights   09/14/17 0324 09/14/17 0619  Weight: 210 lb (95.3 kg) 219 lb 9.3 oz (99.6 kg)    Telemetry    NSR/sinus brady - Personally Reviewed  ECG    NSR, TWA c/w anterolateral ischemia - Personally Reviewed  Physical Exam   GENERAL:  Well appearing BM in NAD HEENT:  PERRL, EOMI, sclera are clear. Oropharynx is clear. NECK:  No jugular venous distention, carotid upstroke brisk and symmetric, no bruits, no thyromegaly or adenopathy LUNGS:  Clear to auscultation bilaterally CHEST:  Unremarkable HEART:  RRR,  PMI not displaced or sustained,S1 and S2 within normal limits, no S3, no S4: no clicks, no rubs, no murmurs ABD:  Soft, nontender. BS +, no masses or bruits. No hepatomegaly, no splenomegaly EXT:  2 + pulses throughout, no edema, no cyanosis no clubbing SKIN:  Warm and dry.  No rashes NEURO:  Alert and  oriented x 3. Cranial nerves II through XII intact. PSYCH:  Cognitively intact    Labs    Chemistry Recent Labs  Lab 09/14/17 0330 09/15/17 0012  NA 140 137  K 2.8* 3.7  CL 101 107  CO2 24 24  GLUCOSE 119* 119*  BUN 13 10  CREATININE 1.30* 1.08  CALCIUM 9.8 9.0  GFRNONAA 52* >60  GFRAA 60* >60  ANIONGAP 15 6     Hematology Recent Labs  Lab 09/14/17 0330 09/15/17 0012  WBC 11.0* 11.3*  RBC 5.16 4.69  HGB 16.3 14.7  HCT 48.1 44.0  MCV 93.2 93.8  MCH 31.6 31.3  MCHC 33.9 33.4  RDW 12.7 13.0  PLT 239 216    Cardiac Enzymes Recent Labs  Lab 09/14/17 0618 09/14/17 1412 09/14/17 1847 09/15/17 0012  TROPONINI 0.65* 6.42* 10.47* 12.83*    Recent Labs  Lab 09/14/17 0339  TROPIPOC 0.02     BNPNo results for input(s): BNP, PROBNP in the last 168 hours.   DDimer No results for input(s): DDIMER in the last 168 hours.   Radiology    No results found.  Cardiac Studies   Cardiac cath/PCI: 09/14/17: Conclusion  Prox LAD to Mid LAD lesion is 95% stenosed. (Tandem 80-95% stenosis)  A drug-eluting stent was successfully placed using a STENT SIERRA 4.00 X 38 MM. --Postdilated to 4.6 mm  A second overlapping drug-eluting stent was successfully placed using a STENT SIERRA 4.00 X 12 MM. --Postdilated to 4.6 mm  Post intervention, there is a 0% residual stenosis.  _______________________________________________  Colon Flattery 2nd Diag lesion is 50% stenosed.  Prox RCA to Mid RCA lesion is 100% stenosed. CTO of nondominant RCA with bridging collaterals  Mid Cx to Dist Cx lesion is 60% stenosed. 1st LPL lesion is 50% stenosed.  There is mild to moderate left ventricular systolic dysfunction. The left ventricular ejection fraction is 45-50% by visual estimate. With apical anterior, apical and inferoapical hypo-a kinesis  LV end diastolic pressure is moderately elevated.   Severe two-vessel disease with almost subtotally occluded proximal mid wraparound LAD as a  likely culprit lesion along with a likely CTO of the still large caliber nondominant right coronary artery.  Moderate disease of the dominant circumflex. Tandem 80 and 95% lesions in the proximal to mid LAD were treated with 2 overlapping DES stents as noted. The subtle EKG changes noted leading to diagnosis of acute posterior infarct is likely related to this being a wraparound LAD that was potentially intermittently occlusive.    Plan:  Admit to CCU for monitoring and TR band removal.  Dual antiplatelet therapy for minimum 1 year.  Aggressive risk factor modification: We will increase Crestor dose to 40 mg, convert from HCTZ to carvedilol and continue ARB  Check fasting lipid level and A1c  We will order 2D echocardiogram for baseline.  Depending on how he does clinically, consider fast-track discharge    Glenetta Hew, M.D., M.S. Interventional Cardiologist    Echo: Study Conclusions  - Left ventricle: The cavity size was normal. Wall thickness was   increased in a pattern of moderate LVH. Systolic function was   normal. The estimated ejection fraction was in the range of 50%   to 55%. Moderate hypokinesis of the mid-apicalanteroseptal   myocardium. - Aortic valve: Trileaflet; normal thickness, mildly calcified   leaflets. - Left atrium: The atrium was moderately dilated.  Patient Profile     76 y.o. male with history of HTN and HLD presents with an anterior STEMI  Assessment & Plan    1. Anterior STEMI. S/p PCI of the LAD with DES x 2. Peak troponin 12.83. Ecg with persistent T wave inversion in anterolateral leads. Echo shows preserved LV function with EF 50-55% and anterior HK. Patient also has CTO of the RCA with good bridging right to right collaterals. Continue DAPT. Patient doing very well. Will plan DC home today. 2. HTN well controlled. 3. HLD on high dose Crestor.   For questions or updates, please contact [place] Please consult [email or web address] for  contact info under Cardiology/STEMI.      Signed, Elsey Holts [place], MD  09/16/2017, 8:17 AM

## 2017-09-17 ENCOUNTER — Telehealth (HOSPITAL_COMMUNITY): Payer: Self-pay

## 2017-09-17 NOTE — Telephone Encounter (Signed)
Patient's insurance is active and benefits verified through Medicare Part A & B - No co-pay, deductible amount of $18300/$0.00 has been met, no out of pocket, 20% co-insurance, and no pre-authorization is required. Passport/reference [phone]  Patient's insurance with [employer] has termed 01/13/2016.  Patient will be contacted and scheduled after their follow up appt with the Cardiologist office upon review by the RN Navigator.

## 2017-09-23 ENCOUNTER — Ambulatory Visit: Payer: Medicare Other | Admitting: Physician Assistant

## 2017-09-28 DIAGNOSIS — I25119 Atherosclerotic heart disease of native coronary artery with unspecified angina pectoris: Secondary | ICD-10-CM | POA: Insufficient documentation

## 2017-10-01 DIAGNOSIS — H25099 Other age-related incipient cataract, unspecified eye: Secondary | ICD-10-CM | POA: Diagnosis not present

## 2017-10-15 ENCOUNTER — Ambulatory Visit (INDEPENDENT_AMBULATORY_CARE_PROVIDER_SITE_OTHER): Payer: Medicare Other | Admitting: Physician Assistant

## 2017-10-15 ENCOUNTER — Encounter: Payer: Self-pay | Admitting: Physician Assistant

## 2017-10-15 ENCOUNTER — Other Ambulatory Visit: Payer: Self-pay | Admitting: *Deleted

## 2017-10-15 VITALS — BP 168/90 | HR 68 | Ht 71.0 in | Wt 222.0 lb

## 2017-10-15 DIAGNOSIS — E876 Hypokalemia: Secondary | ICD-10-CM

## 2017-10-15 DIAGNOSIS — E785 Hyperlipidemia, unspecified: Secondary | ICD-10-CM

## 2017-10-15 DIAGNOSIS — I10 Essential (primary) hypertension: Secondary | ICD-10-CM

## 2017-10-15 DIAGNOSIS — R001 Bradycardia, unspecified: Secondary | ICD-10-CM | POA: Diagnosis not present

## 2017-10-15 DIAGNOSIS — I2129 ST elevation (STEMI) myocardial infarction involving other sites: Secondary | ICD-10-CM

## 2017-10-15 DIAGNOSIS — Z79899 Other long term (current) drug therapy: Secondary | ICD-10-CM | POA: Diagnosis not present

## 2017-10-15 DIAGNOSIS — I2510 Atherosclerotic heart disease of native coronary artery without angina pectoris: Secondary | ICD-10-CM | POA: Diagnosis not present

## 2017-10-15 MED ORDER — CARVEDILOL 6.25 MG PO TABS: 6.2500 mg | ORAL_TABLET | Freq: Two times a day (BID) | ORAL | 3 refills | Status: DC

## 2017-10-15 MED ORDER — CARVEDILOL 3.125 MG PO TABS: 3.1250 mg | ORAL_TABLET | Freq: Two times a day (BID) | ORAL | 3 refills | Status: DC

## 2017-10-15 MED ORDER — AMLODIPINE BESYLATE 5 MG PO TABS: 5.0000 mg | ORAL_TABLET | Freq: Every day | ORAL | 3 refills | Status: DC

## 2017-10-15 NOTE — Progress Notes (Signed)
Cardiology Office Note    Date:  10/17/2017   ID:  Travis Key, DOB 1941/03/13, [name] [id number]  PCP:  [facility], Provider, MD  Cardiologist:  Dr. Ellyn Hack   Chief Complaint  Patient presents with  . Follow-up    Seen for Dr. Ellyn Hack, post PCI of LAD  . Headache    History of Present Illness:  Travis Key is a 77 y.o. male with PMH of HTN, HLD, and recently diagnosed CAD.  Patient went to the hospital on 09/14/2017 with STEMI.  Emergent cardiac catheterization performed by Dr. Ellyn Hack showed 95% proximal to mid LAD stenosis treated with overlapping sierra 4.0 x 38 mm and Sierra 4.0 x 12 mm DES, 50% ostial D2 lesion, 100% proximal to mid nondominant RCA with bridging collaterals, 60% mid left circumflex lesion, EF 45-50%.  Echocardiogram obtained on the same day showed EF 50-55%, moderate LVH, moderate hypokinesis of the mid apical anteroseptal myocardium.  Patient presents today for cardiology office visit.  He denies any significant chest discomfort or shortness of breath.  He has been compliant with his medication.  He recently just got Brilinta refilled again, it will cost roughly $40 a month.  He will continue on aspirin and Brilinta for the next 36-month.  His blood pressure is usually running in the 130s at home, however his blood pressure is elevated today. He is bradycardic on EKG, preventing [place] from increasing his beta-blocker. I will add amlodipine 5 mg daily.  He will need a 6-8 weeks fasting lipid panel.  His potassium was also low in the hospital with initial potassium of 2.8, potassium prior to discharge was 3.7.  I will obtain a complete metabolic panel in 6-8 weeks to check his liver and also potassium level.  Otherwise, he is accompanied by his wife today.  We discussed signs and symptoms of angina.  He does not have any heart failure symptoms recently, and appears to be euvolemic on physical exam.    Past Medical History:  Diagnosis Date  . Adenomatous polyp of colon 08/2003  . Anal  fissure   . CAD (coronary artery disease)    12/18 STEMI PCI/DESx1 mLAD, CTO of RCA, normal EF  . Diverticulosis   . High cholesterol   . Hypertension   . Internal hemorrhoids     Past Surgical History:  Procedure Laterality Date  . CORONARY STENT INTERVENTION N/A 09/14/2017   Procedure: CORONARY STENT INTERVENTION;  Surgeon: Leonie Man, MD;  Location: [facility];  Service: Cardiovascular;  Laterality: N/A;  . CORONARY/GRAFT ACUTE MI REVASCULARIZATION N/A 09/14/2017   Procedure: Coronary/Graft Acute MI Revascularization;  Surgeon: Leonie Man, MD;  Location: [facility];  Service: Cardiovascular;  Laterality: N/A;  . LEFT HEART CATH AND CORONARY ANGIOGRAPHY N/A 09/14/2017   Procedure: LEFT HEART CATH AND CORONARY ANGIOGRAPHY;  Surgeon: Leonie Man, MD;  Location: [facility];  Service: Cardiovascular;  Laterality: N/A;  . ROTATOR CUFF REPAIR Right   . UMBILICAL HERNIA REPAIR      Current Medications: Outpatient Medications Prior to Visit  Medication Sig Dispense Refill  . aspirin 81 MG chewable tablet Chew 1 tablet (81 mg total) by mouth daily.    . irbesartan (AVAPRO) 300 MG tablet Take 300 mg by mouth daily.    . nitroGLYCERIN (NITROSTAT) 0.4 MG SL tablet Place 1 tablet (0.4 mg total) under the tongue every 5 (five) minutes as needed. 25 tablet 2  . rosuvastatin (CRESTOR) 40 MG tablet Take  0.5 tablets (20 mg total) by mouth at bedtime. 90 tablet 1  . tetrahydrozoline 0.05 % ophthalmic solution Place 1 drop into both eyes daily.    . ticagrelor (BRILINTA) 90 MG TABS tablet Take 1 tablet (90 mg total) by mouth 2 (two) times daily. 180 tablet 2  . carvedilol (COREG) 3.125 MG tablet Take 1 tablet (3.125 mg total) by mouth 2 (two) times daily with a meal. 60 tablet 3   No facility-administered medications prior to visit.      Allergies:   Patient has no known allergies.   Social History   Socioeconomic History  . Marital status: Widowed     Spouse name: None  . Number of children: 4  . Years of education: None  . Highest education level: None  Social Needs  . Financial resource strain: None  . Food insecurity - worry: None  . Food insecurity - inability: None  . Transportation needs - medical: None  . Transportation needs - non-medical: None  Occupational History  . Occupation: retired Airline pilot  Tobacco Use  . Smoking status: Former Smoker    Types: Cigarettes    Last attempt to quit: 03/02/1985    Years since quitting: 32.6  . Smokeless tobacco: Never Used  Substance and Sexual Activity  . Alcohol use: No    Alcohol/week: 0.0 oz  . Drug use: No  . Sexual activity: None  Other Topics Concern  . None  Social History Narrative  . None     Family History:  The patient's family history includes Diabetes in his mother; Liver disease in his mother.   ROS:   Please see the history of present illness.    ROS All other systems reviewed and are negative.   PHYSICAL EXAM:   VS:  BP (!) 168/90   Pulse 68   Ht 5\' 11"  (1.803 m)   Wt 222 lb (100.7 kg)   BMI 30.96 kg/m    GEN: Well nourished, well developed, in no acute distress  HEENT: normal  Neck: no JVD, carotid bruits, or masses Cardiac: RRR; no murmurs, rubs, or gallops,no edema  Respiratory:  clear to auscultation bilaterally, normal work of breathing GI: soft, nontender, nondistended, + BS MS: no deformity or atrophy  Skin: warm and dry, no rash Neuro:  Alert and Oriented x 3, Strength and sensation are intact Psych: euthymic mood, full affect  Wt Readings from Last 3 Encounters:  10/15/17 222 lb (100.7 kg)  09/14/17 219 lb 9.3 oz (99.6 kg)  03/14/15 226 lb (102.5 kg)      Studies/Labs Reviewed:   EKG:  EKG is ordered today.  The ekg ordered today demonstrates normal sinus bradycardia, HR 50, T wave inversion in anterior leads  Recent Labs: 09/15/2017: BUN 10; Creatinine, Ser 1.08; Hemoglobin 14.7; Platelets 216; Potassium 3.7; Sodium 137    Lipid Panel    Component Value Date/Time   CHOL 131 09/15/2017 0012   TRIG 90 09/15/2017 0012   HDL 38 (L) 09/15/2017 0012   CHOLHDL 3.4 09/15/2017 0012   VLDL 18 09/15/2017 0012   LDLCALC 75 09/15/2017 0012    Additional studies/ records that were reviewed today include:   Cath 09/14/2017 Conclusion     Prox LAD to Mid LAD lesion is 95% stenosed. (Tandem 80-95% stenosis)  A drug-eluting stent was successfully placed using a STENT SIERRA 4.00 X 38 MM. --Postdilated to 4.6 mm  A second overlapping drug-eluting stent was successfully placed using a STENT SIERRA 4.00 X  12 MM. --Postdilated to 4.6 mm  Post intervention, there is a 0% residual stenosis.  _______________________________________________  Colon Flattery 2nd Diag lesion is 50% stenosed.  Prox RCA to Mid RCA lesion is 100% stenosed. CTO of nondominant RCA with bridging collaterals  Mid Cx to Dist Cx lesion is 60% stenosed. 1st LPL lesion is 50% stenosed.  There is mild to moderate left ventricular systolic dysfunction. The left ventricular ejection fraction is 45-50% by visual estimate. With apical anterior, apical and inferoapical hypo-a kinesis  LV end diastolic pressure is moderately elevated.   Severe two-vessel disease with almost subtotally occluded proximal mid wraparound LAD as a likely culprit lesion along with a likely CTO of the still large caliber nondominant right coronary artery.  Moderate disease of the dominant circumflex. Tandem 80 and 95% lesions in the proximal to mid LAD were treated with 2 overlapping DES stents as noted. The subtle EKG changes noted leading to diagnosis of acute posterior infarct is likely related to this being a wraparound LAD that was potentially intermittently occlusive.         Echo 09/14/2017 LV EF: 50% -   55%  Study Conclusions  - Left ventricle: The cavity size was normal. Wall thickness was   increased in a pattern of moderate LVH. Systolic function was   normal.  The estimated ejection fraction was in the range of 50%   to 55%. Moderate hypokinesis of the mid-apicalanteroseptal   myocardium. - Aortic valve: Trileaflet; normal thickness, mildly calcified   leaflets. - Left atrium: The atrium was moderately dilated.   ASSESSMENT:    1. Coronary artery disease involving native coronary artery of native heart without angina pectoris   2. Encounter for long-term (current) use of medications   3. Hyperlipidemia, unspecified hyperlipidemia type   4. Hypokalemia   5. Essential hypertension   6. Bradycardia      PLAN:  In order of problems listed above:  1. CAD s/p LAD stent: Denies any anginal symptoms since discharge.  We have emphasized on the need to be compliant with dual antiplatelet therapy.  Brilinta currently cost roughly $40 per month for him.  Otherwise he has been doing very well since discharge.  2. Hypertension: Blood pressure is elevated today, however normally systolic blood pressure running in the 130s.  I will add 5 mg daily of amlodipine.  Unable to uptitrate beta-blocker due to baseline bradycardia.  3. Hyperlipidemia: Continue Crestor 20 mg daily.  Fasting lipid panel in 6-8 weeks.  Liver function checked with CMP  4. Hypokalemia: Potassium 2.8 in the hospital, this improved prior to discharge.  On the next lab work, he will need CMP.  If potassium low again, he will need a potassium supplement.    Medication Adjustments/Labs and Tests Ordered: Current medicines are reviewed at length with the patient today.  Concerns regarding medicines are outlined above.  Medication changes, Labs and Tests ordered today are listed in the Patient Instructions below. Patient Instructions  Medication Instructions:   ADD Amlodipine 5mg  DAILY to your daily medication regimen. Continue all other medications at current doses.  Continue to monitor your blood pressure daily at home.  Labwork:   Fasting labwork (cholesterol and liver function  test) to be done in 6-8 weeks. You may have this drawn in our office, and you do not need an appointment. Our lab is open from 8:00 to 4:30 M-F (closed for lunch from 12:45 to 1:45).  You will need to fast for this test. (Nothing to  eat or drink for 6-8 hours prior to test.)    Testing/Procedures:  none  Follow-Up:  With Dr. Ellyn Hack in 2 months  If you need a refill on your cardiac medications before your next appointment, please call your pharmacy.     Hilbert Corrigan, [place]  10/17/2017 12:00 AM    [address], [place], [place]  [address] Phone: [phone]; Fax: [phone]

## 2017-10-15 NOTE — Patient Instructions (Addendum)
Medication Instructions:   ADD Amlodipine 5mg  DAILY to your daily medication regimen. Continue all other medications at current doses.  Continue to monitor your blood pressure daily at home.  Labwork:   Fasting labwork (cholesterol and liver function test) to be done in 6-8 weeks. You may have this drawn in our office, and you do not need an appointment. Our lab is open from 8:00 to 4:30 M-F (closed for lunch from 12:45 to 1:45).  You will need to fast for this test. (Nothing to eat or drink for 6-8 hours prior to test.)    Testing/Procedures:  none  Follow-Up:  With Dr. Ellyn Hack in 2 months  If you need a refill on your cardiac medications before your next appointment, please call your pharmacy.

## 2017-10-16 ENCOUNTER — Encounter: Payer: Self-pay | Admitting: Physician Assistant

## 2017-10-17 ENCOUNTER — Telehealth (HOSPITAL_COMMUNITY): Payer: Self-pay

## 2017-10-17 NOTE — Telephone Encounter (Signed)
Called and spoke with patient in regards to Cardiac Rehab - Patient is interested. Scheduled orientation on 11/20/2017 at 8:15am. Patient will attend the 9:45am exc class.

## 2017-11-06 ENCOUNTER — Telehealth: Payer: Self-pay | Admitting: Physician Assistant

## 2017-11-06 NOTE — Telephone Encounter (Signed)
New message    Patient calling with BP concerns. Please call  Pt c/o BP issue: STAT if pt c/o blurred vision, one-sided weakness or slurred speech  1. What are your last 5 BP readings? 125/65, 106/67, 105/71, 121/71  2. Are you having any other symptoms (ex. Dizziness, headache, blurred vision, passed out)?   3. What is your BP issue? Feels BP too low

## 2017-11-06 NOTE — Telephone Encounter (Signed)
Returned call to patient.He stated he has been checking B/P at home.B/P ranging 105/71,121/71,106/67,123/78 pulse 56,62.Stated he wanted to make sure B/P not too low.Advised B/P readings are good.Advised to continue all medications and continue to monitor B/P.Advised I will send message to [facility].

## 2017-11-06 NOTE — Telephone Encounter (Signed)
Blood pressures look good.  Not too low.  Glenetta Hew, MD

## 2017-11-07 NOTE — Telephone Encounter (Signed)
Left message to continue current medications ,no changes

## 2017-11-12 ENCOUNTER — Telehealth (HOSPITAL_COMMUNITY): Payer: Self-pay | Admitting: Pharmacist

## 2017-11-12 NOTE — Telephone Encounter (Signed)
Cardiac Rehab Medication Review by a Pharmacist  Does the patient  feel that his/her medications are working for him/her?  yes  Has the patient been experiencing any side effects to the medications prescribed?  Yes - has been feeling run-down since starting amlodipine  Does the patient measure his/her own blood pressure or blood glucose at home?  yes - 118/72 today, tends to run in that range   Does the patient have any problems obtaining medications due to transportation or finances?   no  Understanding of regimen: fair Understanding of indications: fair Potential of compliance: good     Pharmacist comments: Travis Key is a 77 y/o male who endorses compliance with his medications. He states since starting amlodipine he feels "different" and more "run-down", so he started taking 1/2 tablet twice a day to help with this feeling.  He also takes rosuvastatin 0.5 mg in the morning and at bedtime (to make 40 mg daily). I questioned him about this dosing since it is prescribed for 20 mg daily (0.5 tab at bedtime), but he states that he was told to increase his dose to 40 mg after he was discharged from the hospital, but his bottle still says take 0.5 tablets at bedtime so he just added another 0.5 tablet in the morning. It is unclear from the chart if he should be taking 20 mg or 40 mg of rosuvastatin daily - this prescription may need to be updated to reflect what he is actually taking.    Charlene Brooke, PharmD PGY1 Pharmacy Resident Email: [email or web address] Pager: [phone] 11/12/2017 4:46 PM

## 2017-11-20 ENCOUNTER — Encounter (HOSPITAL_COMMUNITY): Payer: Self-pay

## 2017-11-20 ENCOUNTER — Encounter (HOSPITAL_COMMUNITY)
Admission: RE | Admit: 2017-11-20 | Discharge: 2017-11-20 | Disposition: A | Discharge status: Home / Self Care | Payer: Medicare Other | Source: Ambulatory Visit | Attending: Cardiology | Admitting: Cardiology

## 2017-11-20 VITALS — BP 142/80 | HR 58 | Ht 69.0 in | Wt 219.1 lb

## 2017-11-20 DIAGNOSIS — E7800 Pure hypercholesterolemia, unspecified: Secondary | ICD-10-CM | POA: Insufficient documentation

## 2017-11-20 DIAGNOSIS — Z955 Presence of coronary angioplasty implant and graft: Secondary | ICD-10-CM | POA: Insufficient documentation

## 2017-11-20 DIAGNOSIS — I2510 Atherosclerotic heart disease of native coronary artery without angina pectoris: Secondary | ICD-10-CM | POA: Diagnosis not present

## 2017-11-20 DIAGNOSIS — Z87891 Personal history of nicotine dependence: Secondary | ICD-10-CM | POA: Diagnosis not present

## 2017-11-20 DIAGNOSIS — Z7982 Long term (current) use of aspirin: Secondary | ICD-10-CM | POA: Insufficient documentation

## 2017-11-20 DIAGNOSIS — Z7902 Long term (current) use of antithrombotics/antiplatelets: Secondary | ICD-10-CM | POA: Insufficient documentation

## 2017-11-20 DIAGNOSIS — Z79899 Other long term (current) drug therapy: Secondary | ICD-10-CM | POA: Diagnosis not present

## 2017-11-20 DIAGNOSIS — I2102 ST elevation (STEMI) myocardial infarction involving left anterior descending coronary artery: Secondary | ICD-10-CM

## 2017-11-20 DIAGNOSIS — I252 Old myocardial infarction: Secondary | ICD-10-CM | POA: Insufficient documentation

## 2017-11-20 DIAGNOSIS — I10 Essential (primary) hypertension: Secondary | ICD-10-CM | POA: Diagnosis not present

## 2017-11-20 DIAGNOSIS — Z86010 Personal history of colonic polyps: Secondary | ICD-10-CM | POA: Insufficient documentation

## 2017-11-20 NOTE — Progress Notes (Signed)
Cardiac Individual Treatment Plan  Patient Details  Name: Travis Key [name]: [id number] Date of Birth: [birth year] Referring Provider:     CARDIAC REHAB PHASE II ORIENTATION from 11/20/2017 in [facility]  Referring Provider  Leonie Man MD      Initial Encounter Date:    CARDIAC REHAB PHASE II ORIENTATION from 11/20/2017 in [facility]  Date  11/20/17  Referring Provider  Leonie Man MD      Visit Diagnosis: ST elevation myocardial infarction involving left anterior descending (LAD) coronary artery ([place]) 09/14/17  Status post coronary artery stent placement DES LAD 09/14/17  Patient's Home Medications on Admission:  Current Outpatient Medications:  .  amLODipine (NORVASC) 5 MG tablet, Take 1 tablet (5 mg total) by mouth daily. (Patient taking differently: Take 2.5 mg by mouth 2 (two) times daily. ), Disp: 30 tablet, Rfl: 3 .  aspirin 81 MG chewable tablet, Chew 1 tablet (81 mg total) by mouth daily., Disp: , Rfl:  .  carvedilol (COREG) 3.125 MG tablet, Take 1 tablet (3.125 mg total) by mouth 2 (two) times daily with a meal., Disp: 60 tablet, Rfl: 3 .  irbesartan (AVAPRO) 300 MG tablet, Take 300 mg by mouth daily., Disp: , Rfl:  .  nitroGLYCERIN (NITROSTAT) 0.4 MG SL tablet, Place 1 tablet (0.4 mg total) under the tongue every 5 (five) minutes as needed., Disp: 25 tablet, Rfl: 2 .  rosuvastatin (CRESTOR) 40 MG tablet, Take 0.5 tablets (20 mg total) by mouth at bedtime. (Patient taking differently: Take 20 mg by mouth 2 (two) times daily at 8 am and 10 pm. ), Disp: 90 tablet, Rfl: 1 .  tetrahydrozoline 0.05 % ophthalmic solution, Place 1 drop into both eyes daily., Disp: , Rfl:  .  ticagrelor (BRILINTA) 90 MG TABS tablet, Take 1 tablet (90 mg total) by mouth 2 (two) times daily., Disp: 180 tablet, Rfl: 2  Past Medical History: Past Medical History:  Diagnosis Date  . Adenomatous polyp of colon 08/2003  . Anal fissure    . CAD (coronary artery disease)    12/18 STEMI PCI/DESx1 mLAD, CTO of RCA, normal EF  . Diverticulosis   . High cholesterol   . Hypertension   . Internal hemorrhoids     Tobacco Use: Social History   Tobacco Use  Smoking Status Former Smoker  . Types: Cigarettes  . Last attempt to quit: 03/02/1985  . Years since quitting: 32.7  Smokeless Tobacco Never Used    Labs: Recent Review Scientist, physiological    Labs for ITP Cardiac and Pulmonary Rehab Latest Ref Rng & Units 09/14/2017 09/15/2017   Cholestrol 0 - 200 mg/dL 159 131   LDLCALC 0 - 99 mg/dL 94 75   HDL >40 mg/dL 43 38(L)   Trlycerides <150 mg/dL 108 90   Hemoglobin A1c 4.8 - 5.6 % - 5.5      Capillary Blood Glucose: No results found for: GLUCAP   Exercise Target Goals: Date: 11/20/17  Exercise Program Goal: Individual exercise prescription set using results from initial 6 min walk test and THRR while considering  patient's activity barriers and safety.   Exercise Prescription Goal: Initial exercise prescription builds to 30-45 minutes a day of aerobic activity, 2-3 days per week.  Home exercise guidelines will be given to patient during program as part of exercise prescription that the participant will acknowledge.  Activity Barriers & Risk Stratification: Activity Barriers & Cardiac Risk Stratification - 11/20/17 8119  Activity Barriers & Cardiac Risk Stratification   Activity Barriers  None    Cardiac Risk Stratification  High       6 Minute Walk: 6 Minute Walk    Row Name 11/20/17 1102         6 Minute Walk   Phase  Initial     Distance  1600 feet     Walk Time  6 minutes     # of Rest Breaks  0     MPH  3     METS  2.8     RPE  11     Perceived Dyspnea   0     VO2 Peak  10.1     Symptoms  No     Resting HR  58 bpm     Resting BP  142/80     Resting Oxygen Saturation   96 %     Exercise Oxygen Saturation  during 6 min walk  99 %     Max Ex. HR  86 bpm     Max Ex. BP  160/80     2 Minute  Post BP  148/80        Oxygen Initial Assessment:   Oxygen Re-Evaluation:   Oxygen Discharge (Final Oxygen Re-Evaluation):   Initial Exercise Prescription: Initial Exercise Prescription - 11/20/17 1100      Date of Initial Exercise RX and Referring Provider   Date  11/20/17    Referring Provider  Leonie Man MD      Treadmill   MPH  2.5    Grade  0    Minutes  10    METs  2.91      Bike   Level  0.8    Minutes  10    METs  2.7      NuStep   Level  3    SPM  75    Minutes  10    METs  2.8      Prescription Details   Frequency (times per week)  3x    Duration  Progress to 30 minutes of continuous aerobic without signs/symptoms of physical distress      Intensity   THRR 40-80% of Max Heartrate  58-115    Ratings of Perceived Exertion  11-13    Perceived Dyspnea  0-4      Progression   Progression  Continue progressive overload as per policy without signs/symptoms or physical distress.      Resistance Training   Training Prescription  Yes    Weight  4lbs    Reps  10-15       Perform Capillary Blood Glucose checks as needed.  Exercise Prescription Changes:   Exercise Comments:   Exercise Goals and Review: Exercise Goals    Row Name 11/20/17 1106             Exercise Goals   Increase Physical Activity  Yes       Intervention  Provide advice, education, support and counseling about physical activity/exercise needs.;Develop an individualized exercise prescription for aerobic and resistive training based on initial evaluation findings, risk stratification, comorbidities and participant's personal goals.       Expected Outcomes  Short Term: Attend rehab on a regular basis to increase amount of physical activity.;Long Term: Add in home exercise to make exercise part of routine and to increase amount of physical activity.;Long Term: Exercising regularly at least 3-5 days a week.  Increase Strength and Stamina  Yes       Intervention  Provide  advice, education, support and counseling about physical activity/exercise needs.;Develop an individualized exercise prescription for aerobic and resistive training based on initial evaluation findings, risk stratification, comorbidities and participant's personal goals.       Expected Outcomes  Short Term: Increase workloads from initial exercise prescription for resistance, speed, and METs.;Short Term: Perform resistance training exercises routinely during rehab and add in resistance training at home;Long Term: Improve cardiorespiratory fitness, muscular endurance and strength as measured by increased METs and functional capacity (6MWT)       Able to understand and use rate of perceived exertion (RPE) scale  Yes       Intervention  Provide education and explanation on how to use RPE scale       Expected Outcomes  Short Term: Able to use RPE daily in rehab to express subjective intensity level;Long Term:  Able to use RPE to guide intensity level when exercising independently       Able to understand and use Dyspnea scale  Yes       Intervention  Provide education and explanation on how to use Dyspnea scale       Expected Outcomes  Short Term: Able to use Dyspnea scale daily in rehab to express subjective sense of shortness of breath during exertion;Long Term: Able to use Dyspnea scale to guide intensity level when exercising independently       Knowledge and understanding of Target Heart Rate Range (THRR)  Yes       Intervention  Provide education and explanation of THRR including how the numbers were predicted and where they are located for reference       Expected Outcomes  Short Term: Able to use daily as guideline for intensity in rehab;Short Term: Able to state/look up THRR;Long Term: Able to use THRR to govern intensity when exercising independently       Able to check pulse independently  Yes       Intervention  Review the importance of being able to check your own pulse for safety during  independent exercise;Provide education and demonstration on how to check pulse in carotid and radial arteries.       Expected Outcomes  Short Term: Able to explain why pulse checking is important during independent exercise;Long Term: Able to check pulse independently and accurately       Understanding of Exercise Prescription  Yes       Intervention  Provide education, explanation, and written materials on patient's individual exercise prescription       Expected Outcomes  Short Term: Able to explain program exercise prescription;Long Term: Able to explain home exercise prescription to exercise independently          Exercise Goals Re-Evaluation :    Discharge Exercise Prescription (Final Exercise Prescription Changes):   Nutrition:  Target Goals: Understanding of nutrition guidelines, daily intake of sodium 1500mg , cholesterol 200mg , calories 30% from fat and 7% or less from saturated fats, daily to have 5 or more servings of fruits and vegetables.  Biometrics: Pre Biometrics - 11/20/17 1106      Pre Biometrics   Height  5\' 9"  (1.753 m)    Weight  219 lb 2.2 oz (99.4 kg)    Waist Circumference  44 inches    Hip Circumference  45 inches    Waist to Hip Ratio  0.98 %    BMI (Calculated)  32.35  Triceps Skinfold  32 mm    % Body Fat  34.5 %    Grip Strength  53 kg    Flexibility  9 in    Single Leg Stand  30 seconds        Nutrition Therapy Plan and Nutrition Goals: Nutrition Therapy & Goals - 11/20/17 1106      Nutrition Therapy   Diet  Heart Healthy      Personal Nutrition Goals   Nutrition Goal  Wt loss of 1-2 lb/week to a wt loss goal of 6-24 lb at graduation from [place]. Goal wt of 200 lb desired.       Intervention Plan   Intervention  Prescribe, educate and counsel regarding individualized specific dietary modifications aiming towards targeted core components such as weight, hypertension, lipid management, diabetes, heart failure and other  comorbidities.    Expected Outcomes  Short Term Goal: Understand basic principles of dietary content, such as calories, fat, sodium, cholesterol and nutrients.;Long Term Goal: Adherence to prescribed nutrition plan.       Nutrition Assessments: Nutrition Assessments - 11/20/17 1105      MEDFICTS Scores   Pre Score  24       Nutrition Goals Re-Evaluation:   Nutrition Goals Re-Evaluation:   Nutrition Goals Discharge (Final Nutrition Goals Re-Evaluation):   Psychosocial: Target Goals: Acknowledge presence or absence of significant depression and/or stress, maximize coping skills, provide positive support system. Participant is able to verbalize types and ability to use techniques and skills needed for reducing stress and depression.  Initial Review & Psychosocial Screening: Initial Psych Review & Screening - 11/20/17 1149      Initial Review   Current issues with  None Identified      Family Dynamics   Good Support System?  Yes children, family members and friends      Barriers   Psychosocial barriers to participate in program  There are no identifiable barriers or psychosocial needs.      Screening Interventions   Interventions  Encouraged to exercise       Quality of Life Scores: Quality of Life - 11/20/17 1107      Quality of Life Scores   Health/Function Pre  24.85 %    Socioeconomic Pre  26.43 %    Psych/Spiritual Pre  24.71 %    Family Pre  26.4 %    GLOBAL Pre  25.37 %      Scores of 19 and below usually indicate a poorer quality of life in these areas.  A difference of  2-3 points is a clinically meaningful difference.  A difference of 2-3 points in the total score of the Quality of Life Index has been associated with significant improvement in overall quality of life, self-image, physical symptoms, and general health in studies assessing change in quality of life.  PHQ-9: Recent Review Flowsheet Data    There is no flowsheet data to display.      Interpretation of Total Score  Total Score Depression Severity:  1-4 = Minimal depression, 5-9 = Mild depression, 10-14 = Moderate depression, 15-19 = Moderately severe depression, 20-27 = Severe depression   Psychosocial Evaluation and Intervention:   Psychosocial Re-Evaluation:   Psychosocial Discharge (Final Psychosocial Re-Evaluation):   Vocational Rehabilitation: Provide vocational rehab assistance to qualifying candidates.   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 Participant will state understanding/return demonstration of topics presented.  Learning Barriers/Preferences: Learning Barriers/Preferences - 11/20/17 0856      Learning Barriers/Preferences   Learning Preferences  Written Material;Audio;Video       Education Topics: Count Your Pulse:  -Group instruction provided by verbal instruction, demonstration, patient participation and written materials to support subject.  Instructors address importance of being able to find your pulse and how to count your pulse when at home without a heart monitor.  Patients get hands on experience counting their pulse with staff help and individually.   Heart Attack, Angina, and Risk Factor Modification:  -Group instruction provided by verbal instruction, video, and written materials to support subject.  Instructors address signs and symptoms of angina and heart attacks.    Also discuss risk factors for heart disease and how to make changes to improve heart health risk factors.   Functional Fitness:  -Group instruction provided by verbal instruction, demonstration, patient participation,  and written materials to support subject.  Instructors address safety measures for doing things around the house.  Discuss how to get up and down off the floor, how to pick things up properly, how to safely get out of a chair without assistance, and balance training.   Meditation and Mindfulness:  -Group instruction provided by verbal instruction, patient participation, and written materials to support subject.  Instructor addresses importance of mindfulness and meditation practice to help reduce stress and improve awareness.  Instructor also leads participants through a meditation exercise.    Stretching for Flexibility and Mobility:  -Group instruction provided by verbal instruction, patient participation, and written materials to support subject.  Instructors lead participants through series of stretches that are designed to increase flexibility thus improving mobility.  These stretches are additional exercise for major muscle groups that are typically performed during regular warm up and cool down.   Hands Only CPR:  -Group verbal, video, and participation provides a basic overview of AHA guidelines for community CPR. Role-play of emergencies allow participants the opportunity to practice calling for help and chest compression technique with discussion of AED use.   Hypertension: -Group verbal and written instruction that provides a basic overview of hypertension including the most recent diagnostic guidelines, risk factor reduction with self-care instructions and medication management.    Nutrition I class: Heart Healthy Eating:  -Group instruction provided by PowerPoint slides, verbal discussion, and written materials to support subject matter. The instructor gives an explanation and review of the Therapeutic Lifestyle Changes diet recommendations, which includes a discussion on lipid goals, dietary fat, sodium, fiber, plant stanol/sterol esters, sugar, and the components of a  well-balanced, healthy diet.   Nutrition II class: Lifestyle Skills:  -Group instruction provided by PowerPoint slides, verbal discussion, and written materials to support subject matter. The instructor gives an explanation and review of label reading, grocery shopping for heart health, heart healthy recipe modifications, and ways to make healthier choices when eating out.   Diabetes Question & Answer:  -Group instruction provided by PowerPoint slides, verbal discussion, and written materials to support subject matter. The instructor gives an explanation and review of diabetes co-morbidities, pre- and post-prandial blood glucose goals, pre-exercise blood glucose goals, signs, symptoms, and treatment of hypoglycemia and hyperglycemia, and foot care basics.   Diabetes Blitz:  -Group  instruction provided by PowerPoint slides, verbal discussion, and written materials to support subject matter. The instructor gives an explanation and review of the physiology behind type 1 and type 2 diabetes, diabetes medications and rational behind using different medications, pre- and post-prandial blood glucose recommendations and Hemoglobin A1c goals, diabetes diet, and exercise including blood glucose guidelines for exercising safely.    Portion Distortion:  -Group instruction provided by PowerPoint slides, verbal discussion, written materials, and food models to support subject matter. The instructor gives an explanation of serving size versus portion size, changes in portions sizes over the last 20 years, and what consists of a serving from each food group.   Stress Management:  -Group instruction provided by verbal instruction, video, and written materials to support subject matter.  Instructors review role of stress in heart disease and how to cope with stress positively.     Exercising on Your Own:  -Group instruction provided by verbal instruction, power point, and written materials to support subject.   Instructors discuss benefits of exercise, components of exercise, frequency and intensity of exercise, and end points for exercise.  Also discuss use of nitroglycerin and activating EMS.  Review options of places to exercise outside of rehab.  Review guidelines for sex with heart disease.   Cardiac Drugs I:  -Group instruction provided by verbal instruction and written materials to support subject.  Instructor reviews cardiac drug classes: antiplatelets, anticoagulants, beta blockers, and statins.  Instructor discusses reasons, side effects, and lifestyle considerations for each drug class.   Cardiac Drugs II:  -Group instruction provided by verbal instruction and written materials to support subject.  Instructor reviews cardiac drug classes: angiotensin converting enzyme inhibitors (ACE-I), angiotensin II receptor blockers (ARBs), nitrates, and calcium channel blockers.  Instructor discusses reasons, side effects, and lifestyle considerations for each drug class.   Anatomy and Physiology of the Circulatory System:  Group verbal and written instruction and models provide basic cardiac anatomy and physiology, with the coronary electrical and arterial systems. Review of: AMI, Angina, Valve disease, Heart Failure, Peripheral Artery Disease, Cardiac Arrhythmia, Pacemakers, and the ICD.   Other Education:  -Group or individual verbal, written, or video instructions that support the educational goals of the cardiac rehab program.   Holiday Eating Survival Tips:  -Group instruction provided by PowerPoint slides, verbal discussion, and written materials to support subject matter. The instructor gives patients tips, tricks, and techniques to help them not only survive but enjoy the holidays despite the onslaught of food that accompanies the holidays.   Knowledge Questionnaire Score: Knowledge Questionnaire Score - 11/20/17 1107      Knowledge Questionnaire Score   Pre Score  19/24       Core  Components/Risk Factors/Patient Goals at Admission: Personal Goals and Risk Factors at Admission - 11/20/17 1100      Core Components/Risk Factors/Patient Goals on Admission    Weight Management  Yes;Weight Loss    Intervention  Weight Management: Develop a combined nutrition and exercise program designed to reach desired caloric intake, while maintaining appropriate intake of nutrient and fiber, sodium and fats, and appropriate energy expenditure required for the weight goal.;Weight Management: Provide education and appropriate resources to help participant work on and attain dietary goals.;Weight Management/Obesity: Establish reasonable short term and long term weight goals.;Obesity: Provide education and appropriate resources to help participant work on and attain dietary goals.    Admit Weight  218 lb 11.2 oz (99.2 kg)    Goal Weight: Short Term  213 lb (  96.6 kg)    Goal Weight: Long Term  208 lb (94.3 kg)    Expected Outcomes  Short Term: Continue to assess and modify interventions until short term weight is achieved;Long Term: Adherence to nutrition and physical activity/exercise program aimed toward attainment of established weight goal;Weight Loss: Understanding of general recommendations for a balanced deficit meal plan, which promotes 1-2 lb weight loss per week and includes a negative energy balance of [phone] kcal/d;Understanding recommendations for meals to include 15-35% energy as protein, 25-35% energy from fat, 35-60% energy from carbohydrates, less than 200mg  of dietary cholesterol, 20-35 gm of total fiber daily;Understanding of distribution of calorie intake throughout the day with the consumption of 4-5 meals/snacks    Hypertension  Yes    Intervention  Provide education on lifestyle modifcations including regular physical activity/exercise, weight management, moderate sodium restriction and increased consumption of fresh fruit, vegetables, and low fat dairy, alcohol moderation, and  smoking cessation.;Monitor prescription use compliance.    Expected Outcomes  Short Term: Continued assessment and intervention until BP is < 140/22mm HG in hypertensive participants. < 130/64mm HG in hypertensive participants with diabetes, heart failure or chronic kidney disease.;Long Term: Maintenance of blood pressure at goal levels.    Lipids  Yes    Intervention  Provide education and support for participant on nutrition & aerobic/resistive exercise along with prescribed medications to achieve LDL 70mg , HDL >40mg .    Expected Outcomes  Short Term: Participant states understanding of desired cholesterol values and is compliant with medications prescribed. Participant is following exercise prescription and nutrition guidelines.;Long Term: Cholesterol controlled with medications as prescribed, with individualized exercise RX and with personalized nutrition plan. Value goals: LDL < 70mg , HDL > 40 mg.       Core Components/Risk Factors/Patient Goals Review:    Core Components/Risk Factors/Patient Goals at Discharge (Final Review):    ITP Comments: ITP Comments    Row Name 11/20/17 0857           ITP Comments  Dr. Fransico Him, Medical Director          Comments: Elta Guadeloupe attended orientation from 0815 to 1012 to review rules and guidelines for program. Completed 6 minute walk test, Intitial ITP, and exercise prescription.  VSS. Telemetry-Sinus Rhythm with arrhythmia this has been previously documented .Intermittent PVC's noted Asymptomatic.[name], RN,BSN 11/20/2017 12:02 PM

## 2017-11-20 NOTE — Progress Notes (Signed)
Travis Key 77 y.o. male DOB: 06/16/1941 [name]: [id number]      Nutrition Note  Dx: STEMI, DES LAD Past Medical History:  Diagnosis Date  . Adenomatous polyp of colon 08/2003  . Anal fissure   . CAD (coronary artery disease)    12/18 STEMI PCI/DESx1 mLAD, CTO of RCA, normal EF  . Diverticulosis   . High cholesterol   . Hypertension   . Internal hemorrhoids    Meds reviewed.   HT: Ht Readings from Last 1 Encounters:  10/15/17 5\' 11"  (1.803 m)    WT: Wt Readings from Last 3 Encounters:  10/15/17 222 lb (100.7 kg)  09/14/17 219 lb 9.3 oz (99.6 kg)  03/14/15 226 lb (102.5 kg)     BMI 30.98   Current tobacco use? No   Labs:  Lipid Panel     Component Value Date/Time   CHOL 131 09/15/2017 0012   TRIG 90 09/15/2017 0012   HDL 38 (L) 09/15/2017 0012   CHOLHDL 3.4 09/15/2017 0012   VLDL 18 09/15/2017 0012   LDLCALC 75 09/15/2017 0012    Lab Results  Component Value Date   HGBA1C 5.5 09/15/2017   CBG (last 3)  No results for input(s): GLUCAP in the last 72 hours.  Nutrition Note Spoke with pt. Nutrition plan and goals reviewed with pt. Pt is following a Heart Healthy diet. Pt cooks for himself most of the time; rarely eats out. Pt wants to lose wt but is not actively trying to lose wt at this time.Wt loss tips briefly reviewed. Pt expressed understanding of the information reviewed. Pt aware of nutrition education classes offered.  Nutrition Diagnosis ? Food-and nutrition-related knowledge deficit related to lack of exposure to information as related to diagnosis of: ? CVD  ? Obesity related to excessive energy intake as evidenced by a BMI of 30.98  Nutrition Intervention Pt's individual nutrition plan and goals reviewed with pt.  Nutrition Goal(s):  ? Pt to identify food quantities necessary to achieve weight loss of 6-24 lb (2.7-10.9 kg) at graduation from cardiac rehab. Goal wt of 200 lb desired.   Plan:  Pt to attend nutrition classes ? Nutrition I ? Nutrition II ?  Portion Distortion  Will provide client-centered nutrition education as part of interdisciplinary care.   Monitor and evaluate progress toward nutrition goal with team.  Derek Mound, M.Ed, RD, LDN, CDE 11/20/2017 11:00 AM

## 2017-11-24 ENCOUNTER — Encounter (HOSPITAL_COMMUNITY)
Admission: RE | Admit: 2017-11-24 | Discharge: 2017-11-24 | Disposition: A | Discharge status: Home / Self Care | Payer: Medicare Other | Source: Ambulatory Visit | Attending: Cardiology | Admitting: Cardiology

## 2017-11-24 DIAGNOSIS — Z955 Presence of coronary angioplasty implant and graft: Secondary | ICD-10-CM

## 2017-11-24 DIAGNOSIS — I10 Essential (primary) hypertension: Secondary | ICD-10-CM | POA: Diagnosis not present

## 2017-11-24 DIAGNOSIS — I2510 Atherosclerotic heart disease of native coronary artery without angina pectoris: Secondary | ICD-10-CM | POA: Diagnosis not present

## 2017-11-24 DIAGNOSIS — E7800 Pure hypercholesterolemia, unspecified: Secondary | ICD-10-CM | POA: Diagnosis not present

## 2017-11-24 DIAGNOSIS — I252 Old myocardial infarction: Secondary | ICD-10-CM | POA: Diagnosis not present

## 2017-11-24 DIAGNOSIS — Z87891 Personal history of nicotine dependence: Secondary | ICD-10-CM | POA: Diagnosis not present

## 2017-11-24 DIAGNOSIS — I2102 ST elevation (STEMI) myocardial infarction involving left anterior descending coronary artery: Secondary | ICD-10-CM

## 2017-11-24 NOTE — Progress Notes (Signed)
Daily Session Note  Patient Details  Name: Travis Key [name]: [id number] Date of Birth: 1941-01-22 Referring Provider:     CARDIAC REHAB PHASE II ORIENTATION from 11/20/2017 in [place]  Referring Provider  Leonie Man MD      Encounter Date: 11/24/2017  Check In: Session Check In - 11/24/17 0955      Check-In   Location  [facility]    Staff Present  Su Hilt, MS, ACSM RCEP, Exercise Physiologist;Tyara Nevels, MS,ACSM CEP, Exercise Physiologist;Maria Whitaker, RN, Deland Pretty, MS, ACSM CEP, Exercise Physiologist    Supervising physician immediately available to respond to emergencies  [facility] immediately available    Physician(s)  Dr. Clementeen Graham    Medication changes reported      No    Fall or balance concerns reported     No    Tobacco Cessation  No Change    Warm-up and Cool-down  Performed as group-led instruction    Resistance Training Performed  Yes    VAD Patient?  No      Pain Assessment   Currently in Pain?  No/denies    Multiple Pain Sites  No       Capillary Blood Glucose: No results found for this or any previous visit (from the past 24 hour(s)).    Social History   Tobacco Use  Smoking Status Former Smoker  . Types: Cigarettes  . Last attempt to quit: 03/02/1985  . Years since quitting: 32.7  Smokeless Tobacco Never Used    Goals Met:  Exercise tolerated well  Goals Unmet:  Not Applicable  Comments: Travis Key started cardiac rehab today.  Pt tolerated light exercise without difficulty. VSS, telemetry-Sinus Rhtyhm, asymptomatic.  Medication list reconciled. Pt denies barriers to medicaiton compliance.  PSYCHOSOCIAL ASSESSMENT:  PHQ-0. Pt exhibits positive coping skills, hopeful outlook with supportive family. No psychosocial needs identified at this time, no psychosocial interventions necessary.    Pt enjoys camping and riding his motorcycle.   Pt oriented to exercise equipment and  routine.    Understanding verbalized.[name], RN,BSN 11/24/2017 4:52 PM   Dr. Fransico Him is Medical Director for Cardiac Rehab at [facility].

## 2017-11-26 ENCOUNTER — Encounter (HOSPITAL_COMMUNITY)
Admission: RE | Admit: 2017-11-26 | Discharge: 2017-11-26 | Disposition: A | Discharge status: Home / Self Care | Payer: Medicare Other | Source: Ambulatory Visit | Attending: Cardiology | Admitting: Cardiology

## 2017-11-26 DIAGNOSIS — Z955 Presence of coronary angioplasty implant and graft: Secondary | ICD-10-CM

## 2017-11-26 DIAGNOSIS — I10 Essential (primary) hypertension: Secondary | ICD-10-CM | POA: Diagnosis not present

## 2017-11-26 DIAGNOSIS — I2102 ST elevation (STEMI) myocardial infarction involving left anterior descending coronary artery: Secondary | ICD-10-CM

## 2017-11-26 DIAGNOSIS — I2510 Atherosclerotic heart disease of native coronary artery without angina pectoris: Secondary | ICD-10-CM | POA: Diagnosis not present

## 2017-11-26 DIAGNOSIS — I252 Old myocardial infarction: Secondary | ICD-10-CM | POA: Diagnosis not present

## 2017-11-26 DIAGNOSIS — Z87891 Personal history of nicotine dependence: Secondary | ICD-10-CM | POA: Diagnosis not present

## 2017-11-26 DIAGNOSIS — E7800 Pure hypercholesterolemia, unspecified: Secondary | ICD-10-CM | POA: Diagnosis not present

## 2017-11-28 ENCOUNTER — Encounter (HOSPITAL_COMMUNITY)
Admission: RE | Admit: 2017-11-28 | Discharge: 2017-11-28 | Disposition: A | Discharge status: Home / Self Care | Payer: Medicare Other | Source: Ambulatory Visit | Attending: Cardiology | Admitting: Cardiology

## 2017-11-28 DIAGNOSIS — I2510 Atherosclerotic heart disease of native coronary artery without angina pectoris: Secondary | ICD-10-CM | POA: Diagnosis not present

## 2017-11-28 DIAGNOSIS — Z87891 Personal history of nicotine dependence: Secondary | ICD-10-CM | POA: Diagnosis not present

## 2017-11-28 DIAGNOSIS — I252 Old myocardial infarction: Secondary | ICD-10-CM | POA: Diagnosis not present

## 2017-11-28 DIAGNOSIS — I10 Essential (primary) hypertension: Secondary | ICD-10-CM | POA: Diagnosis not present

## 2017-11-28 DIAGNOSIS — I2102 ST elevation (STEMI) myocardial infarction involving left anterior descending coronary artery: Secondary | ICD-10-CM

## 2017-11-28 DIAGNOSIS — Z955 Presence of coronary angioplasty implant and graft: Secondary | ICD-10-CM | POA: Diagnosis not present

## 2017-11-28 DIAGNOSIS — E7800 Pure hypercholesterolemia, unspecified: Secondary | ICD-10-CM | POA: Diagnosis not present

## 2017-12-01 ENCOUNTER — Other Ambulatory Visit: Payer: Self-pay | Admitting: Cardiology

## 2017-12-01 ENCOUNTER — Encounter: Payer: Self-pay | Admitting: Cardiology

## 2017-12-01 ENCOUNTER — Telehealth: Payer: Self-pay | Admitting: Cardiology

## 2017-12-01 ENCOUNTER — Encounter (HOSPITAL_COMMUNITY)
Admission: RE | Admit: 2017-12-01 | Discharge: 2017-12-01 | Disposition: A | Discharge status: Home / Self Care | Payer: Medicare Other | Source: Ambulatory Visit | Attending: Cardiology | Admitting: Cardiology

## 2017-12-01 DIAGNOSIS — E876 Hypokalemia: Secondary | ICD-10-CM | POA: Diagnosis not present

## 2017-12-01 DIAGNOSIS — E7800 Pure hypercholesterolemia, unspecified: Secondary | ICD-10-CM | POA: Diagnosis not present

## 2017-12-01 DIAGNOSIS — Z955 Presence of coronary angioplasty implant and graft: Secondary | ICD-10-CM | POA: Diagnosis not present

## 2017-12-01 DIAGNOSIS — Z87891 Personal history of nicotine dependence: Secondary | ICD-10-CM | POA: Diagnosis not present

## 2017-12-01 DIAGNOSIS — I10 Essential (primary) hypertension: Secondary | ICD-10-CM | POA: Diagnosis not present

## 2017-12-01 DIAGNOSIS — I2102 ST elevation (STEMI) myocardial infarction involving left anterior descending coronary artery: Secondary | ICD-10-CM

## 2017-12-01 DIAGNOSIS — I2510 Atherosclerotic heart disease of native coronary artery without angina pectoris: Secondary | ICD-10-CM | POA: Diagnosis not present

## 2017-12-01 DIAGNOSIS — I493 Ventricular premature depolarization: Secondary | ICD-10-CM

## 2017-12-01 DIAGNOSIS — Z79899 Other long term (current) drug therapy: Secondary | ICD-10-CM | POA: Diagnosis not present

## 2017-12-01 DIAGNOSIS — I252 Old myocardial infarction: Secondary | ICD-10-CM | POA: Diagnosis not present

## 2017-12-01 DIAGNOSIS — E785 Hyperlipidemia, unspecified: Secondary | ICD-10-CM | POA: Diagnosis not present

## 2017-12-01 LAB — COMPREHENSIVE METABOLIC PANEL
A/G RATIO: 1.5 (ref 1.2–2.2)
ALK PHOS: 73 IU/L (ref 39–117)
ALT: 17 IU/L (ref 0–44)
AST: 19 IU/L (ref 0–40)
Albumin: 4.3 g/dL (ref 3.5–4.8)
BUN/Creatinine Ratio: 10 (ref 10–24)
BUN: 11 mg/dL (ref 8–27)
Bilirubin Total: 0.7 mg/dL (ref 0.0–1.2)
CO2: 23 mmol/L (ref 20–29)
Calcium: 9.7 mg/dL (ref 8.6–10.2)
Chloride: 103 mmol/L (ref 96–106)
Creatinine, Ser: 1.08 mg/dL (ref 0.76–1.27)
GFR calc Af Amer: 77 mL/min/{1.73_m2} (ref >59)
GFR calc non Af Amer: 66 mL/min/{1.73_m2} (ref >59)
GLOBULIN, TOTAL: 2.8 g/dL (ref 1.5–4.5)
Glucose: 104 mg/dL — ABNORMAL HIGH (ref 65–99)
POTASSIUM: 4.5 mmol/L (ref 3.5–5.2)
SODIUM: 140 mmol/L (ref 134–144)
Total Protein: 7.1 g/dL (ref 6.0–8.5)

## 2017-12-01 LAB — LIPID PANEL
CHOLESTEROL TOTAL: 121 mg/dL (ref 100–199)
Chol/HDL Ratio: 2.7 ratio (ref 0.0–5.0)
HDL: 45 mg/dL (ref >39)
LDL Calculated: 61 mg/dL (ref 0–99)
TRIGLYCERIDES: 74 mg/dL (ref 0–149)
VLDL Cholesterol Cal: 15 mg/dL (ref 5–40)

## 2017-12-01 NOTE — Progress Notes (Signed)
Reviewed home exercise guidelines with patient including endpoints, temperature precautions, target heart rate and rate of perceived exertion. Pt has equipment at home including bike, treadmill, and rower and is also walking as his mode of home exercise. Pt voices understanding of instructions given. Sol Passer, MS, ACSM CEP

## 2017-12-01 NOTE — Progress Notes (Signed)
Miken continues to have intermittent PVC's that are frequent at times. A nonsustained run of bigeminy, 5 beat run of sVT was also noted with an occasional intermittent couplet. Patient asymptomatic. Vital signs stable. Cecilie Kicks NP called and notified. Mickel Baas ordered a BMET. Mr  Juday left cardiac rehab today without complaints and went to the lab at Dr Allison Quarry office for blood work.Will continue to monitor the patient throughout  the program.Mariaisabel Bodiford Venetia Maxon, RN,BSN 12/01/2017 4:48  PM

## 2017-12-01 NOTE — Telephone Encounter (Signed)
t was having freq PVCs in cardiac rehab.  He has been hypokalemic at times will check BMP at [facility] office.

## 2017-12-02 NOTE — Progress Notes (Signed)
Cardiac Individual Treatment Plan  Patient Details  Name: Travis Key [name]: [id number] Date of Birth: 1941-10-13 Referring Provider:     CARDIAC REHAB PHASE II ORIENTATION from 11/20/2017 in [facility]  Referring Provider  Leonie Man MD      Initial Encounter Date:    CARDIAC REHAB PHASE II ORIENTATION from 11/20/2017 in [facility]  Date  11/20/17  Referring Provider  Leonie Man MD      Visit Diagnosis: ST elevation myocardial infarction involving left anterior descending (LAD) coronary artery ([place]) 09/14/17  Status post coronary artery stent placement DES LAD 09/14/17  Patient's Home Medications on Admission:  Current Outpatient Medications:  .  amLODipine (NORVASC) 5 MG tablet, Take 1 tablet (5 mg total) by mouth daily. (Patient taking differently: Take 2.5 mg by mouth 2 (two) times daily. ), Disp: 30 tablet, Rfl: 3 .  aspirin 81 MG chewable tablet, Chew 1 tablet (81 mg total) by mouth daily., Disp: , Rfl:  .  carvedilol (COREG) 3.125 MG tablet, Take 1 tablet (3.125 mg total) by mouth 2 (two) times daily with a meal., Disp: 60 tablet, Rfl: 3 .  irbesartan (AVAPRO) 300 MG tablet, Take 300 mg by mouth daily., Disp: , Rfl:  .  nitroGLYCERIN (NITROSTAT) 0.4 MG SL tablet, Place 1 tablet (0.4 mg total) under the tongue every 5 (five) minutes as needed., Disp: 25 tablet, Rfl: 2 .  rosuvastatin (CRESTOR) 40 MG tablet, Take 0.5 tablets (20 mg total) by mouth at bedtime. (Patient taking differently: Take 40 mg by mouth 2 (two) times daily at 8 am and 10 pm. ), Disp: 90 tablet, Rfl: 1 .  tetrahydrozoline 0.05 % ophthalmic solution, Place 1 drop into both eyes daily., Disp: , Rfl:  .  ticagrelor (BRILINTA) 90 MG TABS tablet, Take 1 tablet (90 mg total) by mouth 2 (two) times daily., Disp: 180 tablet, Rfl: 2  Past Medical History: Past Medical History:  Diagnosis Date  . Adenomatous polyp of colon 08/2003  . Anal fissure    . CAD (coronary artery disease)    12/18 STEMI PCI/DESx1 mLAD, CTO of RCA, normal EF  . Diverticulosis   . High cholesterol   . Hypertension   . Internal hemorrhoids     Tobacco Use: Social History   Tobacco Use  Smoking Status Former Smoker  . Types: Cigarettes  . Last attempt to quit: 03/02/1985  . Years since quitting: 32.7  Smokeless Tobacco Never Used    Labs: Recent Chemical engineer    Labs for ITP Cardiac and Pulmonary Rehab Latest Ref Rng & Units 09/14/2017 09/15/2017 12/01/2017   Cholestrol 100 - 199 mg/dL 159 131 121   LDLCALC 0 - 99 mg/dL 94 75 61   HDL >39 mg/dL 43 38(L) 45   Trlycerides 0 - 149 mg/dL 108 90 74   Hemoglobin A1c 4.8 - 5.6 % - 5.5 -      Capillary Blood Glucose: No results found for: GLUCAP   Exercise Target Goals:    Exercise Program Goal: Individual exercise prescription set using results from initial 6 min walk test and THRR while considering  patient's activity barriers and safety.   Exercise Prescription Goal: Initial exercise prescription builds to 30-45 minutes a day of aerobic activity, 2-3 days per week.  Home exercise guidelines will be given to patient during program as part of exercise prescription that the participant will acknowledge.  Activity Barriers & Risk Stratification: Activity  Barriers & Cardiac Risk Stratification - 11/20/17 0855      Activity Barriers & Cardiac Risk Stratification   Activity Barriers  None    Cardiac Risk Stratification  High       6 Minute Walk: 6 Minute Walk    Row Name 11/20/17 1102         6 Minute Walk   Phase  Initial     Distance  1600 feet     Walk Time  6 minutes     # of Rest Breaks  0     MPH  3     METS  2.8     RPE  11     Perceived Dyspnea   0     VO2 Peak  10.1     Symptoms  No     Resting HR  58 bpm     Resting BP  142/80     Resting Oxygen Saturation   96 %     Exercise Oxygen Saturation  during 6 min walk  99 %     Max Ex. HR  86 bpm     Max Ex. BP  160/80      2 Minute Post BP  148/80        Oxygen Initial Assessment:   Oxygen Re-Evaluation:   Oxygen Discharge (Final Oxygen Re-Evaluation):   Initial Exercise Prescription: Initial Exercise Prescription - 11/20/17 1100      Date of Initial Exercise RX and Referring Provider   Date  11/20/17    Referring Provider  Leonie Man MD      Treadmill   MPH  2.5    Grade  0    Minutes  10    METs  2.91      Bike   Level  0.8    Minutes  10    METs  2.7      NuStep   Level  3    SPM  75    Minutes  10    METs  2.8      Prescription Details   Frequency (times per week)  3x    Duration  Progress to 30 minutes of continuous aerobic without signs/symptoms of physical distress      Intensity   THRR 40-80% of Max Heartrate  58-115    Ratings of Perceived Exertion  11-13    Perceived Dyspnea  0-4      Progression   Progression  Continue progressive overload as per policy without signs/symptoms or physical distress.      Resistance Training   Training Prescription  Yes    Weight  4lbs    Reps  10-15       Perform Capillary Blood Glucose checks as needed.  Exercise Prescription Changes:  Exercise Prescription Changes    Row Name 11/24/17 0949             Response to Exercise   Blood Pressure (Admit)  128/80       Blood Pressure (Exercise)  150/84       Blood Pressure (Exit)  122/72       Heart Rate (Admit)  57 bpm       Heart Rate (Exercise)  104 bpm       Heart Rate (Exit)  57 bpm       Rating of Perceived Exertion (Exercise)  13       Symptoms  none       Duration  Continue with 30 min of aerobic exercise without signs/symptoms of physical distress.       Intensity  THRR unchanged         Progression   Progression  Continue to progress workloads to maintain intensity without signs/symptoms of physical distress.       Average METs  3.3         Resistance Training   Training Prescription  Yes       Weight  4lbs       Reps  10-15       Time  10  Minutes         Interval Training   Interval Training  No         Treadmill   MPH  2.5       Grade  0       Minutes  10       METs  2.91         Bike   Level  2       Minutes  10       METs  4.39         NuStep   Level  3       SPM  75       Minutes  10       METs  2.7          Exercise Comments:  Exercise Comments    Row Name 11/26/17 0949           Exercise Comments  Reviewed METs and goals with patient. Patient is walking daily and has equipment at home.          Exercise Goals and Review:  Exercise Goals    Row Name 11/20/17 1106             Exercise Goals   Increase Physical Activity  Yes       Intervention  Provide advice, education, support and counseling about physical activity/exercise needs.;Develop an individualized exercise prescription for aerobic and resistive training based on initial evaluation findings, risk stratification, comorbidities and participant's personal goals.       Expected Outcomes  Short Term: Attend rehab on a regular basis to increase amount of physical activity.;Long Term: Add in home exercise to make exercise part of routine and to increase amount of physical activity.;Long Term: Exercising regularly at least 3-5 days a week.       Increase Strength and Stamina  Yes       Intervention  Provide advice, education, support and counseling about physical activity/exercise needs.;Develop an individualized exercise prescription for aerobic and resistive training based on initial evaluation findings, risk stratification, comorbidities and participant's personal goals.       Expected Outcomes  Short Term: Increase workloads from initial exercise prescription for resistance, speed, and METs.;Short Term: Perform resistance training exercises routinely during rehab and add in resistance training at home;Long Term: Improve cardiorespiratory fitness, muscular endurance and strength as measured by increased METs and functional capacity (6MWT)        Able to understand and use rate of perceived exertion (RPE) scale  Yes       Intervention  Provide education and explanation on how to use RPE scale       Expected Outcomes  Short Term: Able to use RPE daily in rehab to express subjective intensity level;Long Term:  Able to use RPE to guide intensity level when exercising independently       Able  to understand and use Dyspnea scale  Yes       Intervention  Provide education and explanation on how to use Dyspnea scale       Expected Outcomes  Short Term: Able to use Dyspnea scale daily in rehab to express subjective sense of shortness of breath during exertion;Long Term: Able to use Dyspnea scale to guide intensity level when exercising independently       Knowledge and understanding of Target Heart Rate Range (THRR)  Yes       Intervention  Provide education and explanation of THRR including how the numbers were predicted and where they are located for reference       Expected Outcomes  Short Term: Able to use daily as guideline for intensity in rehab;Short Term: Able to state/look up THRR;Long Term: Able to use THRR to govern intensity when exercising independently       Able to check pulse independently  Yes       Intervention  Review the importance of being able to check your own pulse for safety during independent exercise;Provide education and demonstration on how to check pulse in carotid and radial arteries.       Expected Outcomes  Short Term: Able to explain why pulse checking is important during independent exercise;Long Term: Able to check pulse independently and accurately       Understanding of Exercise Prescription  Yes       Intervention  Provide education, explanation, and written materials on patient's individual exercise prescription       Expected Outcomes  Short Term: Able to explain program exercise prescription;Long Term: Able to explain home exercise prescription to exercise independently          Exercise Goals  Re-Evaluation : Exercise Goals Re-Evaluation    Row Name 11/24/17 0949 12/01/17 1000           Exercise Goal Re-Evaluation   Exercise Goals Review  Able to understand and use rate of perceived exertion (RPE) scale  Understanding of Exercise Prescription;Knowledge and understanding of Target Heart Rate Range (THRR)      Comments  Off to a good start with exercise. Patient understands and is able to use RPE scale appropriately.  Reviewed home exericse guidelines, including THRR, RPE scale, and endpoints for exercise. Patient has a gym at home including a bike, treadmill and rower. Pt also walks daily.       Expected Outcomes  Increase workloads as tolerated to help achieve health and fitness goals.  Continue daily exercise, 30 minutes to help increase strength and stamina.          Discharge Exercise Prescription (Final Exercise Prescription Changes): Exercise Prescription Changes - 11/24/17 0949      Response to Exercise   Blood Pressure (Admit)  128/80    Blood Pressure (Exercise)  150/84    Blood Pressure (Exit)  122/72    Heart Rate (Admit)  57 bpm    Heart Rate (Exercise)  104 bpm    Heart Rate (Exit)  57 bpm    Rating of Perceived Exertion (Exercise)  13    Symptoms  none    Duration  Continue with 30 min of aerobic exercise without signs/symptoms of physical distress.    Intensity  THRR unchanged      Progression   Progression  Continue to progress workloads to maintain intensity without signs/symptoms of physical distress.    Average METs  3.3      Resistance Training  Training Prescription  Yes    Weight  4lbs    Reps  10-15    Time  10 Minutes      Interval Training   Interval Training  No      Treadmill   MPH  2.5    Grade  0    Minutes  10    METs  2.91      Bike   Level  2    Minutes  10    METs  4.39      NuStep   Level  3    SPM  75    Minutes  10    METs  2.7       Nutrition:  Target Goals: Understanding of nutrition guidelines, daily  intake of sodium 1500mg , cholesterol 200mg , calories 30% from fat and 7% or less from saturated fats, daily to have 5 or more servings of fruits and vegetables.  Biometrics: Pre Biometrics - 11/20/17 1106      Pre Biometrics   Height  5\' 9"  (1.753 m)    Weight  219 lb 2.2 oz (99.4 kg)    Waist Circumference  44 inches    Hip Circumference  45 inches    Waist to Hip Ratio  0.98 %    BMI (Calculated)  32.35    Triceps Skinfold  32 mm    % Body Fat  34.5 %    Grip Strength  53 kg    Flexibility  9 in    Single Leg Stand  30 seconds        Nutrition Therapy Plan and Nutrition Goals: Nutrition Therapy & Goals - 11/20/17 1106      Nutrition Therapy   Diet  Heart Healthy      Personal Nutrition Goals   Nutrition Goal  Wt loss of 1-2 lb/week to a wt loss goal of 6-24 lb at graduation from [facility]. Goal wt of 200 lb desired.       Intervention Plan   Intervention  Prescribe, educate and counsel regarding individualized specific dietary modifications aiming towards targeted core components such as weight, hypertension, lipid management, diabetes, heart failure and other comorbidities.    Expected Outcomes  Short Term Goal: Understand basic principles of dietary content, such as calories, fat, sodium, cholesterol and nutrients.;Long Term Goal: Adherence to prescribed nutrition plan.       Nutrition Assessments: Nutrition Assessments - 11/20/17 1105      MEDFICTS Scores   Pre Score  24       Nutrition Goals Re-Evaluation:   Nutrition Goals Re-Evaluation:   Nutrition Goals Discharge (Final Nutrition Goals Re-Evaluation):   Psychosocial: Target Goals: Acknowledge presence or absence of significant depression and/or stress, maximize coping skills, provide positive support system. Participant is able to verbalize types and ability to use techniques and skills needed for reducing stress and depression.  Initial Review & Psychosocial Screening: Initial Psych Review &  Screening - 11/20/17 1149      Initial Review   Current issues with  None Identified      Family Dynamics   Good Support System?  Yes children, family members and friends      Barriers   Psychosocial barriers to participate in program  There are no identifiable barriers or psychosocial needs.      Screening Interventions   Interventions  Encouraged to exercise       Quality of Life Scores: Quality of Life - 11/20/17 1107  Quality of Life Scores   Health/Function Pre  24.85 %    Socioeconomic Pre  26.43 %    Psych/Spiritual Pre  24.71 %    Family Pre  26.4 %    GLOBAL Pre  25.37 %      Scores of 19 and below usually indicate a poorer quality of life in these areas.  A difference of  2-3 points is a clinically meaningful difference.  A difference of 2-3 points in the total score of the Quality of Life Index has been associated with significant improvement in overall quality of life, self-image, physical symptoms, and general health in studies assessing change in quality of life.  PHQ-9: Recent Review Flowsheet Data    Depression screen [facility] 2/9 11/24/2017   Decreased Interest 0   Down, Depressed, Hopeless 0   PHQ - 2 Score 0     Interpretation of Total Score  Total Score Depression Severity:  1-4 = Minimal depression, 5-9 = Mild depression, 10-14 = Moderate depression, 15-19 = Moderately severe depression, 20-27 = Severe depression   Psychosocial Evaluation and Intervention:   Psychosocial Re-Evaluation: Psychosocial Re-Evaluation    [place] Name 12/02/17 1405             Psychosocial Re-Evaluation   Current issues with  None Identified       Interventions  Encouraged to attend Cardiac Rehabilitation for the exercise       Continue Psychosocial Services   No Follow up required          Psychosocial Discharge (Final Psychosocial Re-Evaluation): Psychosocial Re-Evaluation - 12/02/17 1405      Psychosocial Re-Evaluation   Current issues with  None Identified     Interventions  Encouraged to attend Cardiac Rehabilitation for the exercise    Continue Psychosocial Services   No Follow up required       Vocational Rehabilitation: Provide vocational rehab assistance to qualifying candidates.   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 Participant will state understanding/return demonstration of topics presented.  Learning Barriers/Preferences: Learning Barriers/Preferences - 11/20/17 0856      Learning Barriers/Preferences   Learning Preferences  Written Material;Audio;Video       Education Topics: Count Your Pulse:  -Group instruction provided by verbal instruction, demonstration, patient participation and written materials to support subject.  Instructors address importance of being able to find your pulse and how to count your pulse when at home without a heart monitor.  Patients get hands on experience counting their pulse with staff help and individually.   Heart Attack, Angina, and Risk Factor Modification:  -Group instruction provided by verbal instruction, video, and written materials to support subject.  Instructors address signs and symptoms of angina and heart attacks.    Also discuss risk factors for heart disease and how to make changes to improve heart health risk factors.   Functional Fitness:  -Group instruction provided by verbal instruction, demonstration, patient participation, and written materials to support subject.  Instructors address safety measures for doing things around the house.  Discuss how to get up and down off the floor, how to pick things up properly, how to safely get  out of a chair without assistance, and balance training.   Meditation and Mindfulness:  -Group instruction provided by verbal instruction, patient participation, and written materials to support subject.  Instructor addresses importance of mindfulness and meditation practice to help reduce stress and improve awareness.  Instructor also leads participants through a meditation exercise.    Stretching for Flexibility and Mobility:  -Group instruction provided by verbal instruction, patient participation, and written materials to support subject.  Instructors lead participants through series of stretches that are designed to increase flexibility thus improving mobility.  These stretches are additional exercise for major muscle groups that are typically performed during regular warm up and cool down.   Hands Only CPR:  -Group verbal, video, and participation provides a basic overview of AHA guidelines for community CPR. Role-play of emergencies allow participants the opportunity to practice calling for help and chest compression technique with discussion of AED use.   Hypertension: -Group verbal and written instruction that provides a basic overview of hypertension including the most recent diagnostic guidelines, risk factor reduction with self-care instructions and medication management.    Nutrition I class: Heart Healthy Eating:  -Group instruction provided by PowerPoint slides, verbal discussion, and written materials to support subject matter. The instructor gives an explanation and review of the Therapeutic Lifestyle Changes diet recommendations, which includes a discussion on lipid goals, dietary fat, sodium, fiber, plant stanol/sterol esters, sugar, and the components of a well-balanced, healthy diet.   Nutrition II class: Lifestyle Skills:  -Group instruction provided by PowerPoint slides, verbal discussion, and written materials to support subject matter. The instructor gives an  explanation and review of label reading, grocery shopping for heart health, heart healthy recipe modifications, and ways to make healthier choices when eating out.   Diabetes Question & Answer:  -Group instruction provided by PowerPoint slides, verbal discussion, and written materials to support subject matter. The instructor gives an explanation and review of diabetes co-morbidities, pre- and post-prandial blood glucose goals, pre-exercise blood glucose goals, signs, symptoms, and treatment of hypoglycemia and hyperglycemia, and foot care basics.   Diabetes Blitz:  -Group instruction provided by PowerPoint slides, verbal discussion, and written materials to support subject matter. The instructor gives an explanation and review of the physiology behind type 1 and type 2 diabetes, diabetes medications and rational behind using different medications, pre- and post-prandial blood glucose recommendations and Hemoglobin A1c goals, diabetes diet, and exercise including blood glucose guidelines for exercising safely.    Portion Distortion:  -Group instruction provided by PowerPoint slides, verbal discussion, written materials, and food models to support subject matter. The instructor gives an explanation of serving size versus portion size, changes in portions sizes over the last 20 years, and what consists of a serving from each food group.   Stress Management:  -Group instruction provided by verbal instruction, video, and written materials to support subject matter.  Instructors review role of stress in heart disease and how to cope with stress positively.     Exercising on Your Own:  -Group instruction provided by verbal instruction, power point, and written materials to support subject.  Instructors discuss benefits of exercise, components of exercise, frequency and intensity of exercise, and end points for exercise.  Also discuss use of nitroglycerin and activating EMS.  Review options of places to  exercise outside of rehab.  Review guidelines for sex with heart disease.   Cardiac Drugs I:  -Group instruction provided by verbal instruction and written materials to support subject.  Instructor reviews  cardiac drug classes: antiplatelets, anticoagulants, beta blockers, and statins.  Instructor discusses reasons, side effects, and lifestyle considerations for each drug class.   Cardiac Drugs II:  -Group instruction provided by verbal instruction and written materials to support subject.  Instructor reviews cardiac drug classes: angiotensin converting enzyme inhibitors (ACE-I), angiotensin II receptor blockers (ARBs), nitrates, and calcium channel blockers.  Instructor discusses reasons, side effects, and lifestyle considerations for each drug class.   CARDIAC REHAB PHASE II EXERCISE from 11/26/2017 in [place]  Date  11/26/17  Instruction Review Code  2- Demonstrated Understanding      Anatomy and Physiology of the Circulatory System:  Group verbal and written instruction and models provide basic cardiac anatomy and physiology, with the coronary electrical and arterial systems. Review of: AMI, Angina, Valve disease, Heart Failure, Peripheral Artery Disease, Cardiac Arrhythmia, Pacemakers, and the ICD.   Other Education:  -Group or individual verbal, written, or video instructions that support the educational goals of the cardiac rehab program.   Holiday Eating Survival Tips:  -Group instruction provided by PowerPoint slides, verbal discussion, and written materials to support subject matter. The instructor gives patients tips, tricks, and techniques to help them not only survive but enjoy the holidays despite the onslaught of food that accompanies the holidays.   Knowledge Questionnaire Score: Knowledge Questionnaire Score - 11/20/17 1107      Knowledge Questionnaire Score   Pre Score  19/24       Core Components/Risk Factors/Patient Goals at  Admission: Personal Goals and Risk Factors at Admission - 11/20/17 1100      Core Components/Risk Factors/Patient Goals on Admission    Weight Management  Yes;Weight Loss    Intervention  Weight Management: Develop a combined nutrition and exercise program designed to reach desired caloric intake, while maintaining appropriate intake of nutrient and fiber, sodium and fats, and appropriate energy expenditure required for the weight goal.;Weight Management: Provide education and appropriate resources to help participant work on and attain dietary goals.;Weight Management/Obesity: Establish reasonable short term and long term weight goals.;Obesity: Provide education and appropriate resources to help participant work on and attain dietary goals.    Admit Weight  218 lb 11.2 oz (99.2 kg)    Goal Weight: Short Term  213 lb (96.6 kg)    Goal Weight: Long Term  208 lb (94.3 kg)    Expected Outcomes  Short Term: Continue to assess and modify interventions until short term weight is achieved;Long Term: Adherence to nutrition and physical activity/exercise program aimed toward attainment of established weight goal;Weight Loss: Understanding of general recommendations for a balanced deficit meal plan, which promotes 1-2 lb weight loss per week and includes a negative energy balance of [phone] kcal/d;Understanding recommendations for meals to include 15-35% energy as protein, 25-35% energy from fat, 35-60% energy from carbohydrates, less than 200mg  of dietary cholesterol, 20-35 gm of total fiber daily;Understanding of distribution of calorie intake throughout the day with the consumption of 4-5 meals/snacks    Hypertension  Yes    Intervention  Provide education on lifestyle modifcations including regular physical activity/exercise, weight management, moderate sodium restriction and increased consumption of fresh fruit, vegetables, and low fat dairy, alcohol moderation, and smoking cessation.;Monitor prescription use  compliance.    Expected Outcomes  Short Term: Continued assessment and intervention until BP is < 140/4mm HG in hypertensive participants. < 130/12mm HG in hypertensive participants with diabetes, heart failure or chronic kidney disease.;Long Term: Maintenance of blood pressure at goal levels.  Lipids  Yes    Intervention  Provide education and support for participant on nutrition & aerobic/resistive exercise along with prescribed medications to achieve LDL 70mg , HDL >40mg .    Expected Outcomes  Short Term: Participant states understanding of desired cholesterol values and is compliant with medications prescribed. Participant is following exercise prescription and nutrition guidelines.;Long Term: Cholesterol controlled with medications as prescribed, with individualized exercise RX and with personalized nutrition plan. Value goals: LDL < 70mg , HDL > 40 mg.       Core Components/Risk Factors/Patient Goals Review:  Goals and Risk Factor Review    Row Name 12/02/17 1400             Core Components/Risk Factors/Patient Goals Review   Personal Goals Review  Weight Management/Obesity;Lipids;Hypertension       Review  Stanely is off to a good start to exercise. Some intermittent exertional BP elevations have been noted will contnue to monitor       Expected Outcomes  Dream will continue to particpate in phase 2 cardiac rehab and takes medications for HTN and Hyperlipidemia as prescribed          Core Components/Risk Factors/Patient Goals at Discharge (Final Review):  Goals and Risk Factor Review - 12/02/17 1400      Core Components/Risk Factors/Patient Goals Review   Personal Goals Review  Weight Management/Obesity;Lipids;Hypertension    Review  Hunt is off to a good start to exercise. Some intermittent exertional BP elevations have been noted will contnue to monitor    Expected Outcomes  Kayon will continue to particpate in phase 2 cardiac rehab and takes medications for HTN and Hyperlipidemia as  prescribed       ITP Comments: ITP Comments    Row Name 11/20/17 0857 12/02/17 1404         ITP Comments  Dr. Fransico Him, Medical Director  30 Day ITP Review. Patient with good attendance and participation at phase 2 cardiac rehab         Comments: See ITP comments.[name], RN,BSN 12/03/2017 4:39 PM

## 2017-12-02 NOTE — Progress Notes (Signed)
Kidney function and electrolyte stable.

## 2017-12-02 NOTE — Progress Notes (Signed)
Cholesterol quite well controlled, liver function good

## 2017-12-03 ENCOUNTER — Encounter (HOSPITAL_COMMUNITY)
Admission: RE | Admit: 2017-12-03 | Discharge: 2017-12-03 | Disposition: A | Discharge status: Home / Self Care | Payer: Medicare Other | Source: Ambulatory Visit | Attending: Cardiology | Admitting: Cardiology

## 2017-12-03 ENCOUNTER — Telehealth: Payer: Self-pay | Admitting: Cardiology

## 2017-12-03 DIAGNOSIS — I2510 Atherosclerotic heart disease of native coronary artery without angina pectoris: Secondary | ICD-10-CM | POA: Diagnosis not present

## 2017-12-03 DIAGNOSIS — E7800 Pure hypercholesterolemia, unspecified: Secondary | ICD-10-CM | POA: Diagnosis not present

## 2017-12-03 DIAGNOSIS — I2102 ST elevation (STEMI) myocardial infarction involving left anterior descending coronary artery: Secondary | ICD-10-CM

## 2017-12-03 DIAGNOSIS — I10 Essential (primary) hypertension: Secondary | ICD-10-CM | POA: Diagnosis not present

## 2017-12-03 DIAGNOSIS — Z955 Presence of coronary angioplasty implant and graft: Secondary | ICD-10-CM

## 2017-12-03 DIAGNOSIS — I252 Old myocardial infarction: Secondary | ICD-10-CM | POA: Diagnosis not present

## 2017-12-03 DIAGNOSIS — Z87891 Personal history of nicotine dependence: Secondary | ICD-10-CM | POA: Diagnosis not present

## 2017-12-03 NOTE — Telephone Encounter (Signed)
New message    Patient is at cardiac rehab and having PVC,  Should he continue exercise ? What concerns should they be watching for?

## 2017-12-03 NOTE — Telephone Encounter (Signed)
Returned call to [facility] with Cardiac Rehab.Stated she faxed over EKG strips yesterday for Dr.Harding to review.Stated patient was having frequent PVC's and she wanted to make sure ok for patient to exercise.Advised Dr.Harding out of office today. I will send message to his RN.Stated she would like a call back tomorrow 2/21.

## 2017-12-04 ENCOUNTER — Telehealth: Payer: Self-pay | Admitting: *Deleted

## 2017-12-04 NOTE — Telephone Encounter (Signed)
Recieved call from [facility] at cardiac rehab following up on previous message.  States she faxed over the EKG strips for Dr. Ellyn Hack to review as patient is having frequent PVC's during rehab.   States she needs Dr. Ellyn Hack to review and give ok to proceed with rehab.    Advised I would sent to primary and Dr. Ellyn Hack to follow up on.

## 2017-12-04 NOTE — Telephone Encounter (Signed)
Called cardiac rehab- unavailable  Dr Ellyn Hack reviewed  EKG strips from cardiac rehab. Per Dr Ellyn Hack , Patient is to continue with cardiac rehab.   If Patient does not have symptoms in regards to his rhythm,it be can discuss at next office appointment. March 7,2019 10:20 am. If symptoms,patient/rehab can call to move appointment up sooner.  Sent message to Verdis Frederickson RN at Cardiac rehab

## 2017-12-04 NOTE — Telephone Encounter (Signed)
See other telephone note (called cardiac rehab- unavailable  Dr Ellyn Hack reviewed  EKG strips from cardiac rehab. Per Dr Ellyn Hack , Patient is to continue with cardiac rehab.   If Patient does not have symptoms in regards to his rhythm,it be can discuss at next office appointment. March 7,2019 10:20 am. If symptoms,patient/rehab can call to move appointment up sooner.  Sent message to Verdis Frederickson RN at Cardiac rehab)

## 2017-12-05 ENCOUNTER — Encounter (HOSPITAL_COMMUNITY)
Admission: RE | Admit: 2017-12-05 | Discharge: 2017-12-05 | Disposition: A | Discharge status: Home / Self Care | Payer: Medicare Other | Source: Ambulatory Visit | Attending: Cardiology | Admitting: Cardiology

## 2017-12-05 DIAGNOSIS — I2102 ST elevation (STEMI) myocardial infarction involving left anterior descending coronary artery: Secondary | ICD-10-CM

## 2017-12-05 DIAGNOSIS — I10 Essential (primary) hypertension: Secondary | ICD-10-CM | POA: Diagnosis not present

## 2017-12-05 DIAGNOSIS — I2510 Atherosclerotic heart disease of native coronary artery without angina pectoris: Secondary | ICD-10-CM | POA: Diagnosis not present

## 2017-12-05 DIAGNOSIS — Z955 Presence of coronary angioplasty implant and graft: Secondary | ICD-10-CM

## 2017-12-05 DIAGNOSIS — I252 Old myocardial infarction: Secondary | ICD-10-CM | POA: Diagnosis not present

## 2017-12-05 DIAGNOSIS — E7800 Pure hypercholesterolemia, unspecified: Secondary | ICD-10-CM | POA: Diagnosis not present

## 2017-12-05 DIAGNOSIS — Z87891 Personal history of nicotine dependence: Secondary | ICD-10-CM | POA: Diagnosis not present

## 2017-12-08 ENCOUNTER — Encounter (HOSPITAL_COMMUNITY)
Admission: RE | Admit: 2017-12-08 | Discharge: 2017-12-08 | Disposition: A | Discharge status: Home / Self Care | Payer: Medicare Other | Source: Ambulatory Visit | Attending: Cardiology | Admitting: Cardiology

## 2017-12-08 DIAGNOSIS — I2102 ST elevation (STEMI) myocardial infarction involving left anterior descending coronary artery: Secondary | ICD-10-CM

## 2017-12-08 DIAGNOSIS — I252 Old myocardial infarction: Secondary | ICD-10-CM | POA: Diagnosis not present

## 2017-12-08 DIAGNOSIS — E7800 Pure hypercholesterolemia, unspecified: Secondary | ICD-10-CM | POA: Diagnosis not present

## 2017-12-08 DIAGNOSIS — Z955 Presence of coronary angioplasty implant and graft: Secondary | ICD-10-CM

## 2017-12-08 DIAGNOSIS — Z87891 Personal history of nicotine dependence: Secondary | ICD-10-CM | POA: Diagnosis not present

## 2017-12-08 DIAGNOSIS — I2510 Atherosclerotic heart disease of native coronary artery without angina pectoris: Secondary | ICD-10-CM | POA: Diagnosis not present

## 2017-12-08 DIAGNOSIS — I10 Essential (primary) hypertension: Secondary | ICD-10-CM | POA: Diagnosis not present

## 2017-12-10 ENCOUNTER — Encounter (HOSPITAL_COMMUNITY)
Admission: RE | Admit: 2017-12-10 | Discharge: 2017-12-10 | Disposition: A | Discharge status: Home / Self Care | Payer: Medicare Other | Source: Ambulatory Visit | Attending: Cardiology | Admitting: Cardiology

## 2017-12-10 DIAGNOSIS — Z955 Presence of coronary angioplasty implant and graft: Secondary | ICD-10-CM | POA: Diagnosis not present

## 2017-12-10 DIAGNOSIS — I2102 ST elevation (STEMI) myocardial infarction involving left anterior descending coronary artery: Secondary | ICD-10-CM

## 2017-12-10 DIAGNOSIS — E7800 Pure hypercholesterolemia, unspecified: Secondary | ICD-10-CM | POA: Diagnosis not present

## 2017-12-10 DIAGNOSIS — I2510 Atherosclerotic heart disease of native coronary artery without angina pectoris: Secondary | ICD-10-CM | POA: Diagnosis not present

## 2017-12-10 DIAGNOSIS — Z87891 Personal history of nicotine dependence: Secondary | ICD-10-CM | POA: Diagnosis not present

## 2017-12-10 DIAGNOSIS — I252 Old myocardial infarction: Secondary | ICD-10-CM | POA: Diagnosis not present

## 2017-12-10 DIAGNOSIS — I10 Essential (primary) hypertension: Secondary | ICD-10-CM | POA: Diagnosis not present

## 2017-12-12 ENCOUNTER — Encounter (HOSPITAL_COMMUNITY)
Admission: RE | Admit: 2017-12-12 | Discharge: 2017-12-12 | Disposition: A | Discharge status: Home / Self Care | Payer: Medicare Other | Source: Ambulatory Visit | Attending: Cardiology | Admitting: Cardiology

## 2017-12-12 DIAGNOSIS — Z7982 Long term (current) use of aspirin: Secondary | ICD-10-CM | POA: Insufficient documentation

## 2017-12-12 DIAGNOSIS — Z87891 Personal history of nicotine dependence: Secondary | ICD-10-CM | POA: Insufficient documentation

## 2017-12-12 DIAGNOSIS — Z955 Presence of coronary angioplasty implant and graft: Secondary | ICD-10-CM | POA: Diagnosis not present

## 2017-12-12 DIAGNOSIS — I10 Essential (primary) hypertension: Secondary | ICD-10-CM | POA: Insufficient documentation

## 2017-12-12 DIAGNOSIS — I2510 Atherosclerotic heart disease of native coronary artery without angina pectoris: Secondary | ICD-10-CM | POA: Diagnosis not present

## 2017-12-12 DIAGNOSIS — I252 Old myocardial infarction: Secondary | ICD-10-CM | POA: Diagnosis not present

## 2017-12-12 DIAGNOSIS — Z7902 Long term (current) use of antithrombotics/antiplatelets: Secondary | ICD-10-CM | POA: Insufficient documentation

## 2017-12-12 DIAGNOSIS — E7800 Pure hypercholesterolemia, unspecified: Secondary | ICD-10-CM | POA: Insufficient documentation

## 2017-12-12 DIAGNOSIS — Z86010 Personal history of colonic polyps: Secondary | ICD-10-CM | POA: Insufficient documentation

## 2017-12-12 DIAGNOSIS — Z79899 Other long term (current) drug therapy: Secondary | ICD-10-CM | POA: Diagnosis not present

## 2017-12-12 DIAGNOSIS — I2102 ST elevation (STEMI) myocardial infarction involving left anterior descending coronary artery: Secondary | ICD-10-CM

## 2017-12-12 NOTE — Progress Notes (Signed)
Travis Key 77 y.o. male DOB: [birth year] [name]: [id number]      Nutrition Note  Dx: STEMI, DES LAD  Labs:  Lipid Panel     Component Value Date/Time   CHOL 121 12/01/2017 1141   TRIG 74 12/01/2017 1141   HDL 45 12/01/2017 1141   CHOLHDL 2.7 12/01/2017 1141   CHOLHDL 3.4 09/15/2017 0012   VLDL 18 09/15/2017 0012   LDLCALC 61 12/01/2017 1141   Nutrition Note Spoke with pt. Nutrition plan and survey reviewed with pt. Pt is following a Heart Healthy diet. Pt expressed understanding of the information reviewed. Pt aware of nutrition education classes offered.  Nutrition Diagnosis ? Food-and nutrition-related knowledge deficit related to lack of exposure to information as related to diagnosis of: ? CVD  ? Obesity related to excessive energy intake as evidenced by a BMI of 30.98  Nutrition Intervention ? Pt's individual nutrition plan reviewed with pt. ? Benefits of adopting Heart Healthy diet discussed when Medficts reviewed.   ? Pt given handouts for: ? Nutrition I class ? Nutrition II class   Nutrition Goal(s):  ? Pt to identify food quantities necessary to achieve weight loss of 6-24 lb (2.7-10.9 kg) at graduation from cardiac rehab. Goal wt of 200 lb desired.   Plan:  Pt to attend nutrition classes ? Portion Distortion  Will provide client-centered nutrition education as part of interdisciplinary care.   Monitor and evaluate progress toward nutrition goal with team.  Derek Mound, M.Ed, RD, LDN, CDE 12/12/2017 10:45 AM

## 2017-12-15 ENCOUNTER — Encounter (HOSPITAL_COMMUNITY)
Admission: RE | Admit: 2017-12-15 | Discharge: 2017-12-15 | Disposition: A | Discharge status: Home / Self Care | Payer: Medicare Other | Source: Ambulatory Visit | Attending: Cardiology | Admitting: Cardiology

## 2017-12-15 DIAGNOSIS — E7800 Pure hypercholesterolemia, unspecified: Secondary | ICD-10-CM | POA: Diagnosis not present

## 2017-12-15 DIAGNOSIS — I252 Old myocardial infarction: Secondary | ICD-10-CM | POA: Diagnosis not present

## 2017-12-15 DIAGNOSIS — I2510 Atherosclerotic heart disease of native coronary artery without angina pectoris: Secondary | ICD-10-CM | POA: Diagnosis not present

## 2017-12-15 DIAGNOSIS — I2102 ST elevation (STEMI) myocardial infarction involving left anterior descending coronary artery: Secondary | ICD-10-CM

## 2017-12-15 DIAGNOSIS — Z87891 Personal history of nicotine dependence: Secondary | ICD-10-CM | POA: Diagnosis not present

## 2017-12-15 DIAGNOSIS — Z955 Presence of coronary angioplasty implant and graft: Secondary | ICD-10-CM | POA: Diagnosis not present

## 2017-12-15 DIAGNOSIS — I10 Essential (primary) hypertension: Secondary | ICD-10-CM | POA: Diagnosis not present

## 2017-12-16 NOTE — Telephone Encounter (Signed)
Spoke cardiac rehab. Aware of information

## 2017-12-17 ENCOUNTER — Encounter (HOSPITAL_COMMUNITY)
Admission: RE | Admit: 2017-12-17 | Discharge: 2017-12-17 | Disposition: A | Discharge status: Home / Self Care | Payer: Medicare Other | Source: Ambulatory Visit | Attending: Cardiology | Admitting: Cardiology

## 2017-12-17 DIAGNOSIS — I252 Old myocardial infarction: Secondary | ICD-10-CM | POA: Diagnosis not present

## 2017-12-17 DIAGNOSIS — I10 Essential (primary) hypertension: Secondary | ICD-10-CM | POA: Diagnosis not present

## 2017-12-17 DIAGNOSIS — Z955 Presence of coronary angioplasty implant and graft: Secondary | ICD-10-CM

## 2017-12-17 DIAGNOSIS — E7800 Pure hypercholesterolemia, unspecified: Secondary | ICD-10-CM | POA: Diagnosis not present

## 2017-12-17 DIAGNOSIS — I2102 ST elevation (STEMI) myocardial infarction involving left anterior descending coronary artery: Secondary | ICD-10-CM

## 2017-12-17 DIAGNOSIS — Z87891 Personal history of nicotine dependence: Secondary | ICD-10-CM | POA: Diagnosis not present

## 2017-12-17 DIAGNOSIS — I2510 Atherosclerotic heart disease of native coronary artery without angina pectoris: Secondary | ICD-10-CM | POA: Diagnosis not present

## 2017-12-18 ENCOUNTER — Encounter: Payer: Self-pay | Admitting: Cardiology

## 2017-12-18 ENCOUNTER — Ambulatory Visit (INDEPENDENT_AMBULATORY_CARE_PROVIDER_SITE_OTHER): Payer: Medicare Other | Admitting: Cardiology

## 2017-12-18 VITALS — BP 152/78 | HR 62 | Ht 71.0 in | Wt 219.0 lb

## 2017-12-18 DIAGNOSIS — I252 Old myocardial infarction: Secondary | ICD-10-CM | POA: Diagnosis not present

## 2017-12-18 DIAGNOSIS — I493 Ventricular premature depolarization: Secondary | ICD-10-CM | POA: Diagnosis not present

## 2017-12-18 DIAGNOSIS — E785 Hyperlipidemia, unspecified: Secondary | ICD-10-CM

## 2017-12-18 DIAGNOSIS — Z9861 Coronary angioplasty status: Secondary | ICD-10-CM

## 2017-12-18 DIAGNOSIS — I2510 Atherosclerotic heart disease of native coronary artery without angina pectoris: Secondary | ICD-10-CM

## 2017-12-18 DIAGNOSIS — I10 Essential (primary) hypertension: Secondary | ICD-10-CM

## 2017-12-18 MED ORDER — CARVEDILOL 6.25 MG PO TABS: 6.2500 mg | ORAL_TABLET | Freq: Two times a day (BID) | ORAL | 6 refills | Status: DC

## 2017-12-18 NOTE — Progress Notes (Signed)
PCP: Nanci Pina, FNP  Clinic Note: Chief Complaint  Patient presents with  . Follow-up    No major complaints  . Coronary Artery Disease    History of LAD STEMI in December 2018    HPI: Travis Key is a 77 y.o. male with a PMH below who presents today for 72-month follow-up for CAD-STEMI -September 14, 2017 --> p-m LAD PCI with 2 overlapping DES stents.  Nondominant RCA occluded.  Moderate disease in circumflex.  Travis Key was last seen on January 2 by Almyra Deforest, PA --> noted that his home blood pressures are much better than his pressures at the clinic.  Was started on amlodipine.  No significant cardiac issues.  Recent Hospitalizations: none since MI.  Studies Personally Reviewed - (if available, images/films reviewed: From Epic Chart or Care Everywhere)  Transthoracic Echo 09/14/2017: EF 50 and 55%.  Moderate LVH.  Moderate HK of mid-apical anteroseptal wall.  Mild aortic valve calcification.  Cardiac Cath: p-mLAD 95% (tandem lesions 80&95%) --> PCI with 2 overlapping Xience [place] DES (distal 4.0 x 38 -> prox 4.0 x 12 --> post-dilated to ~4.6 mm.], 0% residual.  OstD2 ~50%, m-dCx 60%, LPL1 ~50%. Small Non-dom RCA 100% CTO ~mid.  EF ~45-50% with apical anterior, apical & inferoapical HK.  During cardiac rehab, has noted to have PVCs, bigeminy etc.  Interval History: Travis Key presents today overall doing well.  He is continuing his cardiac rehab.  They say he is having PVCs, but he does not really notice them he is not feeling any irregular heartbeats or palpitations.  No concerning symptoms of recurrent angina or heart failure such as exertional chest tightness or dyspnea.  No PND, orthopnea or edema.  May be a little bit of swelling at the end of the day but nothing overly concerning.  No myalgias or arthralgias   On his current dose of rosuvastatin. He denies any sensation of rapid heartbeats, syncope/near syncope or TIA/amaurosis fugax.  No bleeding issues such as melena, hematochezia,  hematuria or epistaxis.  No claudication.  Remaining active with cardiac rehab, and no major complaints.  ROS: A comprehensive was performed. Review of Systems  Constitutional: Negative for diaphoresis, malaise/fatigue and weight loss.  HENT: Negative for congestion and nosebleeds.   Respiratory: Positive for cough (Still has a mild nonproductive cough.). Negative for hemoptysis and shortness of breath.   Cardiovascular: Negative for leg swelling.  Gastrointestinal: Negative for abdominal pain, blood in stool, heartburn and melena.  Genitourinary: Negative for hematuria.  Musculoskeletal: Negative for joint pain (Only mild arthritis pains).  Neurological: Negative for dizziness and focal weakness.  Psychiatric/Behavioral: Negative for memory loss. The patient is not nervous/anxious and does not have insomnia.   All other systems reviewed and are negative.   I have reviewed and (if needed) personally updated the patient's problem list, medications, allergies, past medical and surgical history, social and family history.   Past Medical History:  Diagnosis Date  . Adenomatous polyp of colon 08/2003  . Anal fissure   . CAD S/P LAD PCI for Antero-Posterior STEMI    12/18 STEMI PCI/DESx1 mLAD, CTO of RCA, normal EF  . Diverticulosis   . High cholesterol   . History of myocardial infarction: Anterior - posterior MI (LAD lesion - PCI) 09/14/2017  . Hypertension   . Internal hemorrhoids     Past Surgical History:  Procedure Laterality Date  . CORONARY STENT INTERVENTION N/A 09/14/2017   Procedure: CORONARY STENT INTERVENTION;  Surgeon: Leonie Man,  MD;  Location: [facility];  Service: Cardiovascular:: p-mLAD 95% (tandem lesions 80&95%) --> PCI with 2 overlapping Xience [place] DES (distal 4.0 x 38 -> prox 4.0 x 12 --> post-dilated to ~4.6 mm.], 0% residual  . CORONARY/GRAFT ACUTE MI REVASCULARIZATION N/A 09/14/2017   Procedure: Coronary/Graft Acute MI Revascularization;   Surgeon: Leonie Man, MD;  Location: [facility];  Service: Cardiovascular;  Laterality: N/A;  . LEFT HEART CATH AND CORONARY ANGIOGRAPHY N/A 09/14/2017   Procedure: LEFT HEART CATH AND CORONARY ANGIOGRAPHY;  Surgeon: Leonie Man, MD;  Location: [facility];  p-m LAD 85-95% tandem lesions (PCI), 0% residual.  OstD2 ~50%, m-dCx 60%, LPL1 ~50%. Small Non-dom RCA 100% CTO ~mid.  EF ~45-50% with apical anterior, apical & inferoapical HK.  Marland Kitchen ROTATOR CUFF REPAIR Right   . TRANSTHORACIC ECHOCARDIOGRAM  09/14/2017   In setting of anterior STEMI: EF 50 and 55%.  Moderate LVH.  Moderate HK of mid-apical anteroseptal wall.  Mild aortic valve calcification.  Marland Kitchen UMBILICAL HERNIA REPAIR      Current Meds  Medication Sig  . amLODipine (NORVASC) 5 MG tablet Take 1 tablet (5 mg total) by mouth daily. (Patient taking differently: Take 2.5 mg by mouth 2 (two) times daily. )  . aspirin 81 MG chewable tablet Chew 1 tablet (81 mg total) by mouth daily.  . irbesartan (AVAPRO) 300 MG tablet Take 300 mg by mouth daily.  . nitroGLYCERIN (NITROSTAT) 0.4 MG SL tablet Place 1 tablet (0.4 mg total) under the tongue every 5 (five) minutes as needed.  . rosuvastatin (CRESTOR) 40 MG tablet Take 0.5 tablets (20 mg total) by mouth at bedtime. (Patient taking differently: Take 40 mg by mouth 2 (two) times daily at 8 am and 10 pm. )  . tetrahydrozoline 0.05 % ophthalmic solution Place 1 drop into both eyes daily.  . ticagrelor (BRILINTA) 90 MG TABS tablet Take 1 tablet (90 mg total) by mouth 2 (two) times daily.  . [DISCONTINUED] carvedilol (COREG) 3.125 MG tablet Take 1 tablet (3.125 mg total) by mouth 2 (two) times daily with a meal.    No Known Allergies  Social History   Tobacco Use  . Smoking status: Former Smoker    Types: Cigarettes    Last attempt to quit: 03/02/1985    Years since quitting: 32.8  . Smokeless tobacco: Never Used  Substance Use Topics  . Alcohol use: No    Alcohol/week: 0.0  oz  . Drug use: No   Social History   Social History Narrative  . Not on file    family history includes Diabetes in his mother; Liver disease in his mother.  Wt Readings from Last 3 Encounters:  12/18/17 219 lb (99.3 kg)  11/20/17 219 lb 2.2 oz (99.4 kg)  10/15/17 222 lb (100.7 kg)    PHYSICAL EXAM BP (!) 152/78   Pulse 62   Ht 5\' 11"  (1.803 m)   Wt 219 lb (99.3 kg)   BMI 30.54 kg/m  Physical Exam  Constitutional: He is oriented to person, place, and time. He appears well-developed and well-nourished. No distress.  HENT:  Head: Normocephalic and atraumatic.  Neck: No JVD present.  Cardiovascular: Normal rate, regular rhythm, S1 normal and intact distal pulses.  No extrasystoles are present. PMI is not displaced. Exam reveals no gallop and no friction rub.  Murmur (Cannot exclude soft systolic ejection murmur heard at the right upper sternal border.) heard. Pulmonary/Chest: Effort normal and breath sounds  normal. No respiratory distress. He has no wheezes. He has no rales. He exhibits no tenderness.  Abdominal: Soft. Bowel sounds are normal. He exhibits no distension. There is no tenderness. There is no rebound.  Musculoskeletal: Normal range of motion. He exhibits no edema.  Neurological: He is alert and oriented to person, place, and time.  Skin: Skin is warm and dry.  Psychiatric: He has a normal mood and affect. His behavior is normal. Judgment and thought content normal.     Adult ECG Report  Rate:  62 ;  Rhythm: normal sinus rhythm and Borderline features for LVH.  Otherwise stable;   Narrative Interpretation: Otherwise normal EKG   Other studies Reviewed: Additional studies/ records that were reviewed today include:  Recent Labs:   Lab Results  Component Value Date   CHOL 121 12/01/2017   HDL 45 12/01/2017   LDLCALC 61 12/01/2017   TRIG 74 12/01/2017   CHOLHDL 2.7 12/01/2017   Lab Results  Component Value Date   CREATININE 1.08 12/01/2017   BUN 11  12/01/2017   NA 140 12/01/2017   K 4.5 12/01/2017   CL 103 12/01/2017   CO2 23 12/01/2017   ASSESSMENT / PLAN: Problem List Items Addressed This Visit    CAD S/P percutaneous coronary angioplasty - Primary (Chronic)    Long lesion of PCI of the LAD proximal to mid with a very large caliber DES stent.  Currently on aspirin plus Brilinta.  Would be okay if necessary for bruising or bleeding to stop aspirin.  Continue Brilinta for at least the first year if not the first 2 years.   With close to 50 mm stent, I am would be reluctant to stop antiplatelet agent and could consider lifelong treatment. He is on carvedilol which I will have him gradually try to titrate up to 6.25 twice daily along with stable dose of amlodipine and ARB.  He is also on Crestor.      Relevant Medications   carvedilol (COREG) 6.25 MG tablet   Frequent PVCs    Frequent PVCs, but with normal EF and no active angina symptoms, I think is probably reasonable just to simply titrate up beta-blocker. Plan: Gradually try to titrate up carvedilol hopefully getting to 6.25mg  twice daily.        Relevant Medications   carvedilol (COREG) 6.25 MG tablet   Other Relevant Orders   EKG 12-Lead (Completed)   History of myocardial infarction: Anterior - posterior MI (LAD lesion - PCI) (Chronic)    He had a pretty significant lesion in the LAD, but was not fully occluded.  I think was of a wraparound LAD that caused the posterior elevations.  Thankfully, EF is preserved with even improvement in the EF by echo versus LV Gram. He continues to go to cardiac rehab.  He is on ARB beta-blocker and now calcium channel blocker along with statin and DAPT.      Hyperlipidemia with target low density lipoprotein (LDL) cholesterol less than 70 mg/dL (Chronic)    Most recent lipid panel shows LDL of 61.  For now we will simply continue current dose of Crestor.  No real myalgias.  Recheck lipids in June.      Relevant Medications   carvedilol  (COREG) 6.25 MG tablet   Other Relevant Orders   Lipid panel   Comprehensive metabolic panel   Hypertension (Chronic)    Blood pressure is little higher than I would like to be today.  Not that much pain  to the blood from the amlodipine dose, however been having some swelling and prefer to use the beta-blocker.  We will increase gradually up to 6.25 twice daily      Relevant Medications   carvedilol (COREG) 6.25 MG tablet   Other Relevant Orders   EKG 12-Lead (Completed)      Current medicines are reviewed at length with the patient today. (+/- concerns) n/a The following changes have been made: n/a  Patient Instructions  MEDICATION INSTRUCTIONS   GRADUALLY INCREASE CARVEDILOL -- FOLLOW DIRECTIONS TAKE FOR 4 DAYS  CARVEDILOL 3.125 MG IN MORNING AND 6.25 MG ( 2 TABLETS) IN THE EVENING)  THEN INCREASE  TO 2 TABLETS          ( TOTAL 6.25MG  ) OF 3.125 MG TWICE A DAY .  PICK UP NEW PRESCRIPTION OF 6.25 MG FROM THE PHARMACY      LABS IN July 2019 WILL MAIL Vivien Presto IN June 2019 LIPID  CMP    Your physician wants you to follow-up in July 2019 WITH DR [facility].You will receive a reminder letter in the mail two months in advance. If you don't receive a letter, please call our office to schedule the follow-up appointment.  If you need a refill on your cardiac medications before your next appointment, please call your pharmacy.    Studies Ordered:   Orders Placed This Encounter  Procedures  . Lipid panel  . Comprehensive metabolic panel  . EKG 12-Lead      Glenetta Hew, M.D., M.S. Interventional Cardiologist   Pager # [phone] Phone # [phone] [address] [place], [place] [address]   Thank you for choosing [facility] at [facility]!!

## 2017-12-18 NOTE — Patient Instructions (Signed)
MEDICATION INSTRUCTIONS   GRADUALLY INCREASE CARVEDILOL -- FOLLOW DIRECTIONS TAKE FOR 4 DAYS  CARVEDILOL 3.125 MG IN MORNING AND 6.25 MG ( 2 TABLETS) IN THE EVENING)  THEN INCREASE  TO 2 TABLETS          ( TOTAL 6.25MG  ) OF 3.125 MG TWICE A DAY .  PICK UP NEW PRESCRIPTION OF 6.25 MG FROM THE PHARMACY      LABS IN July 2019 WILL MAIL Travis Key IN June 2019 LIPID  CMP    Your physician wants you to follow-up in July 2019 WITH DR [facility].You will receive a reminder letter in the mail two months in advance. If you don't receive a letter, please call our office to schedule the follow-up appointment.  If you need a refill on your cardiac medications before your next appointment, please call your pharmacy.

## 2017-12-19 ENCOUNTER — Encounter (HOSPITAL_COMMUNITY)
Admission: RE | Admit: 2017-12-19 | Discharge: 2017-12-19 | Disposition: A | Discharge status: Home / Self Care | Payer: Medicare Other | Source: Ambulatory Visit | Attending: Cardiology | Admitting: Cardiology

## 2017-12-19 DIAGNOSIS — I252 Old myocardial infarction: Secondary | ICD-10-CM | POA: Diagnosis not present

## 2017-12-19 DIAGNOSIS — E7800 Pure hypercholesterolemia, unspecified: Secondary | ICD-10-CM | POA: Diagnosis not present

## 2017-12-19 DIAGNOSIS — I2510 Atherosclerotic heart disease of native coronary artery without angina pectoris: Secondary | ICD-10-CM | POA: Diagnosis not present

## 2017-12-19 DIAGNOSIS — Z955 Presence of coronary angioplasty implant and graft: Secondary | ICD-10-CM

## 2017-12-19 DIAGNOSIS — I2102 ST elevation (STEMI) myocardial infarction involving left anterior descending coronary artery: Secondary | ICD-10-CM

## 2017-12-19 DIAGNOSIS — I10 Essential (primary) hypertension: Secondary | ICD-10-CM | POA: Diagnosis not present

## 2017-12-19 DIAGNOSIS — Z87891 Personal history of nicotine dependence: Secondary | ICD-10-CM | POA: Diagnosis not present

## 2017-12-20 ENCOUNTER — Encounter: Payer: Self-pay | Admitting: Cardiology

## 2017-12-20 DIAGNOSIS — I493 Ventricular premature depolarization: Secondary | ICD-10-CM | POA: Insufficient documentation

## 2017-12-20 NOTE — Assessment & Plan Note (Signed)
Blood pressure is little higher than I would like to be today.  Not that much pain to the blood from the amlodipine dose, however been having some swelling and prefer to use the beta-blocker.  We will increase gradually up to 6.25 twice daily

## 2017-12-20 NOTE — Assessment & Plan Note (Signed)
He had a pretty significant lesion in the LAD, but was not fully occluded.  I think was of a wraparound LAD that caused the posterior elevations.  Thankfully, EF is preserved with even improvement in the EF by echo versus LV Gram. He continues to go to cardiac rehab.  He is on ARB beta-blocker and now calcium channel blocker along with statin and DAPT.

## 2017-12-20 NOTE — Assessment & Plan Note (Signed)
Long lesion of PCI of the LAD proximal to mid with a very large caliber DES stent.  Currently on aspirin plus Brilinta.  Would be okay if necessary for bruising or bleeding to stop aspirin.  Continue Brilinta for at least the first year if not the first 2 years.   With close to 50 mm stent, I am would be reluctant to stop antiplatelet agent and could consider lifelong treatment. He is on carvedilol which I will have him gradually try to titrate up to 6.25 twice daily along with stable dose of amlodipine and ARB.  He is also on Crestor.

## 2017-12-20 NOTE — Assessment & Plan Note (Addendum)
Most recent lipid panel shows LDL of 61.  For now we will simply continue current dose of Crestor.  No real myalgias.  Recheck lipids in June.

## 2017-12-20 NOTE — Assessment & Plan Note (Signed)
Frequent PVCs, but with normal EF and no active angina symptoms, I think is probably reasonable just to simply titrate up beta-blocker. Plan: Gradually try to titrate up carvedilol hopefully getting to 6.25mg  twice daily.

## 2017-12-22 ENCOUNTER — Encounter (HOSPITAL_COMMUNITY)
Admission: RE | Admit: 2017-12-22 | Discharge: 2017-12-22 | Disposition: A | Discharge status: Home / Self Care | Payer: Medicare Other | Source: Ambulatory Visit | Attending: Cardiology | Admitting: Cardiology

## 2017-12-22 DIAGNOSIS — Z955 Presence of coronary angioplasty implant and graft: Secondary | ICD-10-CM | POA: Diagnosis not present

## 2017-12-22 DIAGNOSIS — I252 Old myocardial infarction: Secondary | ICD-10-CM | POA: Diagnosis not present

## 2017-12-22 DIAGNOSIS — I10 Essential (primary) hypertension: Secondary | ICD-10-CM | POA: Diagnosis not present

## 2017-12-22 DIAGNOSIS — Z87891 Personal history of nicotine dependence: Secondary | ICD-10-CM | POA: Diagnosis not present

## 2017-12-22 DIAGNOSIS — I2510 Atherosclerotic heart disease of native coronary artery without angina pectoris: Secondary | ICD-10-CM | POA: Diagnosis not present

## 2017-12-22 DIAGNOSIS — E7800 Pure hypercholesterolemia, unspecified: Secondary | ICD-10-CM | POA: Diagnosis not present

## 2017-12-22 DIAGNOSIS — I2102 ST elevation (STEMI) myocardial infarction involving left anterior descending coronary artery: Secondary | ICD-10-CM

## 2017-12-24 ENCOUNTER — Encounter (HOSPITAL_COMMUNITY)
Admission: RE | Admit: 2017-12-24 | Discharge: 2017-12-24 | Disposition: A | Discharge status: Home / Self Care | Payer: Medicare Other | Source: Ambulatory Visit | Attending: Cardiology | Admitting: Cardiology

## 2017-12-24 DIAGNOSIS — Z955 Presence of coronary angioplasty implant and graft: Secondary | ICD-10-CM

## 2017-12-24 DIAGNOSIS — Z87891 Personal history of nicotine dependence: Secondary | ICD-10-CM | POA: Diagnosis not present

## 2017-12-24 DIAGNOSIS — I2510 Atherosclerotic heart disease of native coronary artery without angina pectoris: Secondary | ICD-10-CM | POA: Diagnosis not present

## 2017-12-24 DIAGNOSIS — I2102 ST elevation (STEMI) myocardial infarction involving left anterior descending coronary artery: Secondary | ICD-10-CM

## 2017-12-24 DIAGNOSIS — I10 Essential (primary) hypertension: Secondary | ICD-10-CM | POA: Diagnosis not present

## 2017-12-24 DIAGNOSIS — I252 Old myocardial infarction: Secondary | ICD-10-CM | POA: Diagnosis not present

## 2017-12-24 DIAGNOSIS — E7800 Pure hypercholesterolemia, unspecified: Secondary | ICD-10-CM | POA: Diagnosis not present

## 2017-12-26 ENCOUNTER — Encounter (HOSPITAL_COMMUNITY)
Admission: RE | Admit: 2017-12-26 | Discharge: 2017-12-26 | Disposition: A | Discharge status: Home / Self Care | Payer: Medicare Other | Source: Ambulatory Visit | Attending: Cardiology | Admitting: Cardiology

## 2017-12-26 DIAGNOSIS — I10 Essential (primary) hypertension: Secondary | ICD-10-CM | POA: Diagnosis not present

## 2017-12-26 DIAGNOSIS — I2102 ST elevation (STEMI) myocardial infarction involving left anterior descending coronary artery: Secondary | ICD-10-CM

## 2017-12-26 DIAGNOSIS — Z955 Presence of coronary angioplasty implant and graft: Secondary | ICD-10-CM

## 2017-12-26 DIAGNOSIS — Z87891 Personal history of nicotine dependence: Secondary | ICD-10-CM | POA: Diagnosis not present

## 2017-12-26 DIAGNOSIS — I252 Old myocardial infarction: Secondary | ICD-10-CM | POA: Diagnosis not present

## 2017-12-26 DIAGNOSIS — E7800 Pure hypercholesterolemia, unspecified: Secondary | ICD-10-CM | POA: Diagnosis not present

## 2017-12-26 DIAGNOSIS — I2510 Atherosclerotic heart disease of native coronary artery without angina pectoris: Secondary | ICD-10-CM | POA: Diagnosis not present

## 2017-12-29 ENCOUNTER — Encounter (HOSPITAL_COMMUNITY)
Admission: RE | Admit: 2017-12-29 | Discharge: 2017-12-29 | Disposition: A | Discharge status: Home / Self Care | Payer: Medicare Other | Source: Ambulatory Visit | Attending: Cardiology | Admitting: Cardiology

## 2017-12-29 DIAGNOSIS — E7800 Pure hypercholesterolemia, unspecified: Secondary | ICD-10-CM | POA: Diagnosis not present

## 2017-12-29 DIAGNOSIS — Z87891 Personal history of nicotine dependence: Secondary | ICD-10-CM | POA: Diagnosis not present

## 2017-12-29 DIAGNOSIS — Z955 Presence of coronary angioplasty implant and graft: Secondary | ICD-10-CM | POA: Diagnosis not present

## 2017-12-29 DIAGNOSIS — I10 Essential (primary) hypertension: Secondary | ICD-10-CM | POA: Diagnosis not present

## 2017-12-29 DIAGNOSIS — I2102 ST elevation (STEMI) myocardial infarction involving left anterior descending coronary artery: Secondary | ICD-10-CM

## 2017-12-29 DIAGNOSIS — I2510 Atherosclerotic heart disease of native coronary artery without angina pectoris: Secondary | ICD-10-CM | POA: Diagnosis not present

## 2017-12-29 DIAGNOSIS — I252 Old myocardial infarction: Secondary | ICD-10-CM | POA: Diagnosis not present

## 2017-12-31 ENCOUNTER — Encounter (HOSPITAL_COMMUNITY)
Admission: RE | Admit: 2017-12-31 | Discharge: 2017-12-31 | Disposition: A | Discharge status: Home / Self Care | Payer: Medicare Other | Source: Ambulatory Visit | Attending: Cardiology | Admitting: Cardiology

## 2017-12-31 DIAGNOSIS — Z87891 Personal history of nicotine dependence: Secondary | ICD-10-CM | POA: Diagnosis not present

## 2017-12-31 DIAGNOSIS — Z955 Presence of coronary angioplasty implant and graft: Secondary | ICD-10-CM

## 2017-12-31 DIAGNOSIS — E7800 Pure hypercholesterolemia, unspecified: Secondary | ICD-10-CM | POA: Diagnosis not present

## 2017-12-31 DIAGNOSIS — I2102 ST elevation (STEMI) myocardial infarction involving left anterior descending coronary artery: Secondary | ICD-10-CM

## 2017-12-31 DIAGNOSIS — I10 Essential (primary) hypertension: Secondary | ICD-10-CM | POA: Diagnosis not present

## 2017-12-31 DIAGNOSIS — I2510 Atherosclerotic heart disease of native coronary artery without angina pectoris: Secondary | ICD-10-CM | POA: Diagnosis not present

## 2017-12-31 DIAGNOSIS — I252 Old myocardial infarction: Secondary | ICD-10-CM | POA: Diagnosis not present

## 2018-01-01 NOTE — Progress Notes (Signed)
Cardiac Individual Treatment Plan  Patient Details  Name: Travis Key [name]: [id number] Date of Birth: 11/29/1940 Referring Provider:     CARDIAC REHAB PHASE II ORIENTATION from 11/20/2017 in [place]  Referring Provider  Leonie Man MD      Initial Encounter Date:    CARDIAC REHAB PHASE II ORIENTATION from 11/20/2017 in [place]  Date  11/20/17  Referring Provider  Leonie Man MD      Visit Diagnosis: Status post coronary artery stent placement DES LAD 09/14/17  ST elevation myocardial infarction involving left anterior descending (LAD) coronary artery (Travis Key) 09/14/17  Patient's Home Medications on Admission:  Current Outpatient Medications:  .  amLODipine (NORVASC) 5 MG tablet, Take 1 tablet (5 mg total) by mouth daily. (Patient taking differently: Take 2.5 mg by mouth 2 (two) times daily. ), Disp: 30 tablet, Rfl: 3 .  aspirin 81 MG chewable tablet, Chew 1 tablet (81 mg total) by mouth daily., Disp: , Rfl:  .  carvedilol (COREG) 6.25 MG tablet, Take 1 tablet (6.25 mg total) by mouth 2 (two) times daily., Disp: 60 tablet, Rfl: 6 .  irbesartan (AVAPRO) 300 MG tablet, Take 300 mg by mouth daily., Disp: , Rfl:  .  nitroGLYCERIN (NITROSTAT) 0.4 MG SL tablet, Place 1 tablet (0.4 mg total) under the tongue every 5 (five) minutes as needed., Disp: 25 tablet, Rfl: 2 .  rosuvastatin (CRESTOR) 40 MG tablet, Take 0.5 tablets (20 mg total) by mouth at bedtime. (Patient taking differently: Take 40 mg by mouth 2 (two) times daily at 8 am and 10 pm. ), Disp: 90 tablet, Rfl: 1 .  tetrahydrozoline 0.05 % ophthalmic solution, Place 1 drop into both eyes daily., Disp: , Rfl:  .  ticagrelor (BRILINTA) 90 MG TABS tablet, Take 1 tablet (90 mg total) by mouth 2 (two) times daily., Disp: 180 tablet, Rfl: 2  Past Medical History: Past Medical History:  Diagnosis Date  . Adenomatous polyp of colon 08/2003  . Anal fissure   . CAD S/P LAD  PCI for Antero-Posterior STEMI    12/18 STEMI PCI/DESx1 mLAD, CTO of RCA, normal EF  . Diverticulosis   . High cholesterol   . History of myocardial infarction: Anterior - posterior MI (LAD lesion - PCI) 09/14/2017  . Hypertension   . Internal hemorrhoids     Tobacco Use: Social History   Tobacco Use  Smoking Status Former Smoker  . Types: Cigarettes  . Last attempt to quit: 03/02/1985  . Years since quitting: 32.8  Smokeless Tobacco Never Used    Labs: Recent Chemical engineer    Labs for ITP Cardiac and Pulmonary Rehab Latest Ref Rng & Units 09/14/2017 09/15/2017 12/01/2017   Cholestrol 100 - 199 mg/dL 159 131 121   LDLCALC 0 - 99 mg/dL 94 75 61   HDL >39 mg/dL 43 38(L) 45   Trlycerides 0 - 149 mg/dL 108 90 74   Hemoglobin A1c 4.8 - 5.6 % - 5.5 -      Capillary Blood Glucose: No results found for: GLUCAP   Exercise Target Goals:    Exercise Program Goal: Individual exercise prescription set using results from initial 6 min walk test and THRR while considering  patient's activity barriers and safety.   Exercise Prescription Goal: Initial exercise prescription builds to 30-45 minutes a day of aerobic activity, 2-3 days per week.  Home exercise guidelines will be given to patient during program as part  of exercise prescription that the participant will acknowledge.  Activity Barriers & Risk Stratification: Activity Barriers & Cardiac Risk Stratification - 11/20/17 0855      Activity Barriers & Cardiac Risk Stratification   Activity Barriers  None    Cardiac Risk Stratification  High       6 Minute Walk: 6 Minute Walk    Row Name 11/20/17 1102         6 Minute Walk   Phase  Initial     Distance  1600 feet     Walk Time  6 minutes     # of Rest Breaks  0     MPH  3     METS  2.8     RPE  11     Perceived Dyspnea   0     VO2 Peak  10.1     Symptoms  No     Resting HR  58 bpm     Resting BP  142/80     Resting Oxygen Saturation   96 %     Exercise  Oxygen Saturation  during 6 min walk  99 %     Max Ex. HR  86 bpm     Max Ex. BP  160/80     2 Minute Post BP  148/80        Oxygen Initial Assessment:   Oxygen Re-Evaluation:   Oxygen Discharge (Final Oxygen Re-Evaluation):   Initial Exercise Prescription: Initial Exercise Prescription - 11/20/17 1100      Date of Initial Exercise RX and Referring Provider   Date  11/20/17    Referring Provider  Leonie Man MD      Treadmill   MPH  2.5    Grade  0    Minutes  10    METs  2.91      Bike   Level  0.8    Minutes  10    METs  2.7      NuStep   Level  3    SPM  75    Minutes  10    METs  2.8      Prescription Details   Frequency (times per week)  3x    Duration  Progress to 30 minutes of continuous aerobic without signs/symptoms of physical distress      Intensity   THRR 40-80% of Max Heartrate  58-115    Ratings of Perceived Exertion  11-13    Perceived Dyspnea  0-4      Progression   Progression  Continue progressive overload as per policy without signs/symptoms or physical distress.      Resistance Training   Training Prescription  Yes    Weight  4lbs    Reps  10-15       Perform Capillary Blood Glucose checks as needed.  Exercise Prescription Changes: Exercise Prescription Changes    Row Name 11/24/17 0949 12/08/17 0950 12/26/17 0949         Response to Exercise   Blood Pressure (Admit)  128/80  128/80  114/74     Blood Pressure (Exercise)  150/84  146/82  162/78     Blood Pressure (Exit)  122/72  128/64  108/70     Heart Rate (Admit)  57 bpm  58 bpm  48 bpm     Heart Rate (Exercise)  104 bpm  104 bpm  108 bpm     Heart Rate (Exit)  57 bpm  58 bpm  59 bpm     Rating of Perceived Exertion (Exercise)  13  13  13      Symptoms  none  none  none     Duration  Continue with 30 min of aerobic exercise without signs/symptoms of physical distress.  Continue with 30 min of aerobic exercise without signs/symptoms of physical distress.  Continue  with 30 min of aerobic exercise without signs/symptoms of physical distress.     Intensity  THRR unchanged  THRR unchanged  THRR unchanged       Progression   Progression  Continue to progress workloads to maintain intensity without signs/symptoms of physical distress.  Continue to progress workloads to maintain intensity without signs/symptoms of physical distress.  Continue to progress workloads to maintain intensity without signs/symptoms of physical distress.     Average METs  3.3  3.7  5.2       Resistance Training   Training Prescription  Yes  Yes  Yes     Weight  4lbs  4lbs  5lbs     Reps  10-15  10-15  10-15     Time  10 Minutes  10 Minutes  10 Minutes       Interval Training   Interval Training  No  No  No       Treadmill   MPH  2.5  3  3.5     Grade  0  1  2     Minutes  10  10  10      METs  2.91  3.71  4.65       Bike   Level  2  2  2.8     Minutes  10  10  10      METs  4.39  4.77  7.06       NuStep   Level  3  4  5      SPM  75  75  95     Minutes  10  10  10      METs  2.7  2.5  4       Home Exercise Plan   Plans to continue exercise at  -  Home (comment)  Home (comment)     Frequency  -  Add 4 additional days to program exercise sessions.  Add 4 additional days to program exercise sessions.     Initial Home Exercises Provided  -  12/01/17  12/01/17        Exercise Comments: Exercise Comments    Row Name 11/26/17 0949 12/08/17 0950 12/29/17 1015       Exercise Comments  Reviewed METs and goals with patient. Patient is walking daily and has equipment at home.  Reviewed METs with patient.   Reviewed METs and goals with patient.         Exercise Goals and Review: Exercise Goals    Row Name 11/20/17 1106             Exercise Goals   Increase Physical Activity  Yes       Intervention  Provide advice, education, support and counseling about physical activity/exercise needs.;Develop an individualized exercise prescription for aerobic and resistive  training based on initial evaluation findings, risk stratification, comorbidities and participant's personal goals.       Expected Outcomes  Short Term: Attend rehab on a regular basis to increase amount of physical activity.;Long Term: Add in home exercise to make exercise part of routine and to increase  amount of physical activity.;Long Term: Exercising regularly at least 3-5 days a week.       Increase Strength and Stamina  Yes       Intervention  Provide advice, education, support and counseling about physical activity/exercise needs.;Develop an individualized exercise prescription for aerobic and resistive training based on initial evaluation findings, risk stratification, comorbidities and participant's personal goals.       Expected Outcomes  Short Term: Increase workloads from initial exercise prescription for resistance, speed, and METs.;Short Term: Perform resistance training exercises routinely during rehab and add in resistance training at home;Long Term: Improve cardiorespiratory fitness, muscular endurance and strength as measured by increased METs and functional capacity (6MWT)       Able to understand and use rate of perceived exertion (RPE) scale  Yes       Intervention  Provide education and explanation on how to use RPE scale       Expected Outcomes  Short Term: Able to use RPE daily in rehab to express subjective intensity level;Long Term:  Able to use RPE to guide intensity level when exercising independently       Able to understand and use Dyspnea scale  Yes       Intervention  Provide education and explanation on how to use Dyspnea scale       Expected Outcomes  Short Term: Able to use Dyspnea scale daily in rehab to express subjective sense of shortness of breath during exertion;Long Term: Able to use Dyspnea scale to guide intensity level when exercising independently       Knowledge and understanding of Target Heart Rate Range (THRR)  Yes       Intervention  Provide education  and explanation of THRR including how the numbers were predicted and where they are located for reference       Expected Outcomes  Short Term: Able to use daily as guideline for intensity in rehab;Short Term: Able to state/look up THRR;Long Term: Able to use THRR to govern intensity when exercising independently       Able to check pulse independently  Yes       Intervention  Review the importance of being able to check your own pulse for safety during independent exercise;Provide education and demonstration on how to check pulse in carotid and radial arteries.       Expected Outcomes  Short Term: Able to explain why pulse checking is important during independent exercise;Long Term: Able to check pulse independently and accurately       Understanding of Exercise Prescription  Yes       Intervention  Provide education, explanation, and written materials on patient's individual exercise prescription       Expected Outcomes  Short Term: Able to explain program exercise prescription;Long Term: Able to explain home exercise prescription to exercise independently          Exercise Goals Re-Evaluation : Exercise Goals Re-Evaluation    Row Name 11/24/17 0949 12/01/17 1000 12/26/17 0949         Exercise Goal Re-Evaluation   Exercise Goals Review  Able to understand and use rate of perceived exertion (RPE) scale  Understanding of Exercise Prescription;Knowledge and understanding of Target Heart Rate Range (THRR)  Increase Strength and Stamina     Comments  Off to a good start with exercise. Patient understands and is able to use RPE scale appropriately.  Reviewed home exericse guidelines, including THRR, RPE scale, and endpoints for exercise. Patient has a gym  at home including a bike, treadmill and rower. Pt also walks daily.   Patient continues to progress well with exercise achieving 5.2 METs. Patient states he's walking a 20 minutes mile and walks 2 miles at least 2 days/week.     Expected Outcomes   Increase workloads as tolerated to help achieve health and fitness goals.  Continue daily exercise, 30 minutes to help increase strength and stamina.  Increase workloads and continue exercise at least 30 minutes, 5 days/week to help achieve personals health and fitness goals.         Discharge Exercise Prescription (Final Exercise Prescription Changes): Exercise Prescription Changes - 12/26/17 0949      Response to Exercise   Blood Pressure (Admit)  114/74    Blood Pressure (Exercise)  162/78    Blood Pressure (Exit)  108/70    Heart Rate (Admit)  48 bpm    Heart Rate (Exercise)  108 bpm    Heart Rate (Exit)  59 bpm    Rating of Perceived Exertion (Exercise)  13    Symptoms  none    Duration  Continue with 30 min of aerobic exercise without signs/symptoms of physical distress.    Intensity  THRR unchanged      Progression   Progression  Continue to progress workloads to maintain intensity without signs/symptoms of physical distress.    Average METs  5.2      Resistance Training   Training Prescription  Yes    Weight  5lbs    Reps  10-15    Time  10 Minutes      Interval Training   Interval Training  No      Treadmill   MPH  3.5    Grade  2    Minutes  10    METs  4.65      Bike   Level  2.8    Minutes  10    METs  7.06      NuStep   Level  5    SPM  95    Minutes  10    METs  4      Home Exercise Plan   Plans to continue exercise at  Home (comment)    Frequency  Add 4 additional days to program exercise sessions.    Initial Home Exercises Provided  12/01/17       Nutrition:  Target Goals: Understanding of nutrition guidelines, daily intake of sodium 1500mg , cholesterol 200mg , calories 30% from fat and 7% or less from saturated fats, daily to have 5 or more servings of fruits and vegetables.  Biometrics: Pre Biometrics - 11/20/17 1106      Pre Biometrics   Height  5\' 9"  (1.753 m)    Weight  219 lb 2.2 oz (99.4 kg)    Waist Circumference  44 inches     Hip Circumference  45 inches    Waist to Hip Ratio  0.98 %    BMI (Calculated)  32.35    Triceps Skinfold  32 mm    % Body Fat  34.5 %    Grip Strength  53 kg    Flexibility  9 in    Single Leg Stand  30 seconds        Nutrition Therapy Plan and Nutrition Goals: Nutrition Therapy & Goals - 11/20/17 1106      Nutrition Therapy   Diet  Heart Healthy      Personal Nutrition Goals   Nutrition  Goal  Wt loss of 1-2 lb/week to a wt loss goal of 6-24 lb at graduation from [place]. Goal wt of 200 lb desired.       Intervention Plan   Intervention  Prescribe, educate and counsel regarding individualized specific dietary modifications aiming towards targeted core components such as weight, hypertension, lipid management, diabetes, heart failure and other comorbidities.    Expected Outcomes  Short Term Goal: Understand basic principles of dietary content, such as calories, fat, sodium, cholesterol and nutrients.;Long Term Goal: Adherence to prescribed nutrition plan.       Nutrition Assessments: Nutrition Assessments - 11/20/17 1105      MEDFICTS Scores   Pre Score  24       Nutrition Goals Re-Evaluation:   Nutrition Goals Re-Evaluation:   Nutrition Goals Discharge (Final Nutrition Goals Re-Evaluation):   Psychosocial: Target Goals: Acknowledge presence or absence of significant depression and/or stress, maximize coping skills, provide positive support system. Participant is able to verbalize types and ability to use techniques and skills needed for reducing stress and depression.  Initial Review & Psychosocial Screening: Initial Psych Review & Screening - 11/20/17 1149      Initial Review   Current issues with  None Identified      Family Dynamics   Good Support System?  Yes children, family members and friends      Barriers   Psychosocial barriers to participate in program  There are no identifiable barriers or psychosocial needs.      Screening Interventions    Interventions  Encouraged to exercise       Quality of Life Scores: Quality of Life - 11/20/17 1107      Quality of Life Scores   Health/Function Pre  24.85 %    Socioeconomic Pre  26.43 %    Psych/Spiritual Pre  24.71 %    Family Pre  26.4 %    GLOBAL Pre  25.37 %      Scores of 19 and below usually indicate a poorer quality of life in these areas.  A difference of  2-3 points is a clinically meaningful difference.  A difference of 2-3 points in the total score of the Quality of Life Index has been associated with significant improvement in overall quality of life, self-image, physical symptoms, and general health in studies assessing change in quality of life.  PHQ-9: Recent Review Flowsheet Data    Depression screen [facility] 2/9 11/24/2017   Decreased Interest 0   Down, Depressed, Hopeless 0   PHQ - 2 Score 0     Interpretation of Total Score  Total Score Depression Severity:  1-4 = Minimal depression, 5-9 = Mild depression, 10-14 = Moderate depression, 15-19 = Moderately severe depression, 20-27 = Severe depression   Psychosocial Evaluation and Intervention:   Psychosocial Re-Evaluation: Psychosocial Re-Evaluation    [place] Name 12/02/17 1405 01/01/18 0934           Psychosocial Re-Evaluation   Current issues with  None Identified  None Identified      Interventions  Encouraged to attend Cardiac Rehabilitation for the exercise  Encouraged to attend Cardiac Rehabilitation for the exercise      Continue Psychosocial Services   No Follow up required  No Follow up required         Psychosocial Discharge (Final Psychosocial Re-Evaluation): Psychosocial Re-Evaluation - 01/01/18 0934      Psychosocial Re-Evaluation   Current issues with  None Identified    Interventions  Encouraged to  attend Cardiac Rehabilitation for the exercise    Continue Psychosocial Services   No Follow up required       Vocational Rehabilitation: Provide vocational rehab assistance to qualifying  candidates.   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 Participant will state understanding/return demonstration of topics presented.  Learning Barriers/Preferences: Learning Barriers/Preferences - 11/20/17 0856      Learning Barriers/Preferences   Learning Preferences  Written Material;Audio;Video       Education Topics: Count Your Pulse:  -Group instruction provided by verbal instruction, demonstration, patient participation and written materials to support subject.  Instructors address importance of being able to find your pulse and how to count your pulse when at home without a heart monitor.  Patients get hands on experience counting their pulse with staff help and individually.   Heart Attack, Angina, and Risk Factor Modification:  -Group instruction provided by verbal instruction, video, and written materials to support subject.  Instructors address signs and symptoms of angina and heart attacks.    Also discuss risk factors for heart disease and how to make changes to improve heart health risk factors.   CARDIAC REHAB PHASE II EXERCISE from 12/17/2017 in [place]  Date  12/10/17  Instruction Review Code  2- Demonstrated Understanding      Functional Fitness:  -Group instruction provided by verbal instruction, demonstration, patient participation, and written materials to support subject.  Instructors address safety measures for doing things around the house.  Discuss how to get up and down off the floor, how to pick things up properly, how to safely get out of a chair without assistance, and balance  training.   Meditation and Mindfulness:  -Group instruction provided by verbal instruction, patient participation, and written materials to support subject.  Instructor addresses importance of mindfulness and meditation practice to help reduce stress and improve awareness.  Instructor also leads participants through a meditation exercise.    Stretching for Flexibility and Mobility:  -Group instruction provided by verbal instruction, patient participation, and written materials to support subject.  Instructors lead participants through series of stretches that are designed to increase flexibility thus improving mobility.  These stretches are additional exercise for major muscle groups that are typically performed during regular warm up and cool down.   Hands Only CPR:  -Group verbal, video, and participation provides a basic overview of AHA guidelines for community CPR. Role-play of emergencies allow participants the opportunity to practice calling for help and chest compression technique with discussion of AED use.   Hypertension: -Group verbal and written instruction that provides a basic overview of hypertension including the most recent diagnostic guidelines, risk factor reduction with self-care instructions and medication management.    Nutrition I class: Heart Healthy Eating:  -Group instruction provided by PowerPoint slides, verbal discussion, and written materials to support subject matter. The instructor gives an explanation and review of the Therapeutic Lifestyle Changes diet recommendations, which includes a discussion on lipid goals, dietary fat, sodium, fiber, plant stanol/sterol esters, sugar, and the components of a well-balanced, healthy diet.   CARDIAC REHAB PHASE II EXERCISE from 12/17/2017 in [place]  [facility]  Date  12/12/17  Educator  RD      Nutrition II class: Lifestyle Skills:  -Group instruction provided by PowerPoint slides, verbal  discussion, and written materials to support subject matter. The instructor gives an explanation and review of label reading, grocery shopping for heart health, heart healthy recipe modifications, and ways to make healthier choices when eating out.   CARDIAC REHAB PHASE II EXERCISE from 12/17/2017 in [place]  Date  12/12/17  Educator  RD      Diabetes Question & Answer:  -Group instruction provided by PowerPoint slides, verbal discussion, and written materials to support subject matter. The instructor gives an explanation and review of diabetes co-morbidities, pre- and post-prandial blood glucose goals, pre-exercise blood glucose goals, signs, symptoms, and treatment of hypoglycemia and hyperglycemia, and foot care basics.   Diabetes Blitz:  -Group instruction provided by PowerPoint slides, verbal discussion, and written materials to support subject matter. The instructor gives an explanation and review of the physiology behind type 1 and type 2 diabetes, diabetes medications and rational behind using different medications, pre- and post-prandial blood glucose recommendations and Hemoglobin A1c goals, diabetes diet, and exercise including blood glucose guidelines for exercising safely.    Portion Distortion:  -Group instruction provided by PowerPoint slides, verbal discussion, written materials, and food models to support subject matter. The instructor gives an explanation of serving size versus portion size, changes in portions sizes over the last 20 years, and what consists of a serving from each food group.   Stress Management:  -Group instruction provided by verbal instruction, video, and written materials to support subject matter.  Instructors review role of stress in heart disease and how to cope with stress positively.     Exercising on Your Own:  -Group instruction provided by verbal instruction, power point, and written materials to support subject.   Instructors discuss benefits of exercise, components of exercise, frequency and intensity of exercise, and end points for exercise.  Also discuss use of nitroglycerin and activating EMS.  Review options of places to exercise outside of rehab.  Review guidelines for sex with heart disease.   CARDIAC REHAB PHASE II EXERCISE from 12/17/2017 in [place]  Date  12/17/17  Instruction Review Code  2- Demonstrated Understanding      Cardiac Drugs I:  -Group instruction provided by verbal instruction and written materials to support subject.  Instructor reviews cardiac drug classes: antiplatelets, anticoagulants, beta blockers, and statins.  Instructor discusses reasons, side effects, and lifestyle considerations for each drug class.   Cardiac Drugs II:  -Group instruction provided by verbal instruction and written materials to support subject.  Instructor reviews cardiac drug classes: angiotensin converting enzyme inhibitors (ACE-I), angiotensin II receptor blockers (ARBs), nitrates, and calcium channel blockers.  Instructor discusses reasons, side effects, and lifestyle considerations for each drug class.   CARDIAC REHAB PHASE II EXERCISE from 12/17/2017 in [place]  Date  11/26/17  Instruction Review Code  2- Demonstrated Understanding      Anatomy and Physiology of the Circulatory System:  Group verbal and written instruction and models provide basic cardiac anatomy and physiology, with the coronary electrical and arterial systems. Review of: AMI, Angina, Valve disease, Heart Failure, Peripheral Artery Disease, Cardiac Arrhythmia, Pacemakers, and the ICD.   CARDIAC REHAB PHASE II EXERCISE from 12/17/2017 in [facility]  Date  12/03/17  Educator  RN  Instruction Review  Code  2- Demonstrated Understanding      Other Education:  -Group or individual verbal, written, or video instructions that support the  educational goals of the cardiac rehab program.   Holiday Eating Survival Tips:  -Group instruction provided by PowerPoint slides, verbal discussion, and written materials to support subject matter. The instructor gives patients tips, tricks, and techniques to help them not only survive but enjoy the holidays despite the onslaught of food that accompanies the holidays.   Knowledge Questionnaire Score: Knowledge Questionnaire Score - 11/20/17 1107      Knowledge Questionnaire Score   Pre Score  19/24       Core Components/Risk Factors/Patient Goals at Admission: Personal Goals and Risk Factors at Admission - 11/20/17 1100      Core Components/Risk Factors/Patient Goals on Admission    Weight Management  Yes;Weight Loss    Intervention  Weight Management: Develop a combined nutrition and exercise program designed to reach desired caloric intake, while maintaining appropriate intake of nutrient and fiber, sodium and fats, and appropriate energy expenditure required for the weight goal.;Weight Management: Provide education and appropriate resources to help participant work on and attain dietary goals.;Weight Management/Obesity: Establish reasonable short term and long term weight goals.;Obesity: Provide education and appropriate resources to help participant work on and attain dietary goals.    Admit Weight  218 lb 11.2 oz (99.2 kg)    Goal Weight: Short Term  213 lb (96.6 kg)    Goal Weight: Long Term  208 lb (94.3 kg)    Expected Outcomes  Short Term: Continue to assess and modify interventions until short term weight is achieved;Long Term: Adherence to nutrition and physical activity/exercise program aimed toward attainment of established weight goal;Weight Loss: Understanding of general recommendations for a balanced deficit meal plan, which promotes 1-2 lb weight loss per week and includes a negative energy balance of [phone] kcal/d;Understanding recommendations for meals to include 15-35%  energy as protein, 25-35% energy from fat, 35-60% energy from carbohydrates, less than 200mg  of dietary cholesterol, 20-35 gm of total fiber daily;Understanding of distribution of calorie intake throughout the day with the consumption of 4-5 meals/snacks    Hypertension  Yes    Intervention  Provide education on lifestyle modifcations including regular physical activity/exercise, weight management, moderate sodium restriction and increased consumption of fresh fruit, vegetables, and low fat dairy, alcohol moderation, and smoking cessation.;Monitor prescription use compliance.    Expected Outcomes  Short Term: Continued assessment and intervention until BP is < 140/45mm HG in hypertensive participants. < 130/67mm HG in hypertensive participants with diabetes, heart failure or chronic kidney disease.;Long Term: Maintenance of blood pressure at goal levels.    Lipids  Yes    Intervention  Provide education and support for participant on nutrition & aerobic/resistive exercise along with prescribed medications to achieve LDL 70mg , HDL >40mg .    Expected Outcomes  Short Term: Participant states understanding of desired cholesterol values and is compliant with medications prescribed. Participant is following exercise prescription and nutrition guidelines.;Long Term: Cholesterol controlled with medications as prescribed, with individualized exercise RX and with personalized nutrition plan. Value goals: LDL < 70mg , HDL > 40 mg.       Core Components/Risk Factors/Patient Goals Review:  Goals and Risk Factor Review    Row Name 12/02/17 1400 01/01/18 0931           Core Components/Risk Factors/Patient Goals Review   Personal Goals Review  Weight Management/Obesity;Lipids;Hypertension  Weight Management/Obesity;Lipids;Hypertension      Review  Kanye is off to a good start to exercise. Some intermittent exertional BP elevations have been noted will contnue to monitor  Julius is off to a good start to exercise. Some  intermittent exertional BP elevations have been noted will contnue to monitor      Expected Outcomes  Tamika will continue to particpate in phase 2 cardiac rehab and takes medications for HTN and Hyperlipidemia as prescribed  Mcadoo will continue to particpate in phase 2 cardiac rehab and takes medications for HTN and Hyperlipidemia as prescribed         Core Components/Risk Factors/Patient Goals at Discharge (Final Review):  Goals and Risk Factor Review - 01/01/18 0931      Core Components/Risk Factors/Patient Goals Review   Personal Goals Review  Weight Management/Obesity;Lipids;Hypertension    Review  Yariel is off to a good start to exercise. Some intermittent exertional BP elevations have been noted will contnue to monitor    Expected Outcomes  Charvez will continue to particpate in phase 2 cardiac rehab and takes medications for HTN and Hyperlipidemia as prescribed       ITP Comments: ITP Comments    Row Name 11/20/17 0857 12/02/17 1404 01/01/18 0931       ITP Comments  Dr. Fransico Him, Medical Director  30 Day ITP Review. Patient with good attendance and participation at phase 2 cardiac rehab  30 Day ITP Review. Patient with good attendance and participation at phase 2 cardiac rehab        Comments: See ITP comments.[name], RN,BSN 01/01/2018 9:37 AM

## 2018-01-02 ENCOUNTER — Encounter (HOSPITAL_COMMUNITY): Payer: Medicare Other

## 2018-01-05 ENCOUNTER — Encounter (HOSPITAL_COMMUNITY)
Admission: RE | Admit: 2018-01-05 | Discharge: 2018-01-05 | Disposition: A | Discharge status: Home / Self Care | Payer: Medicare Other | Source: Ambulatory Visit | Attending: Cardiology | Admitting: Cardiology

## 2018-01-05 DIAGNOSIS — Z955 Presence of coronary angioplasty implant and graft: Secondary | ICD-10-CM | POA: Diagnosis not present

## 2018-01-05 DIAGNOSIS — I10 Essential (primary) hypertension: Secondary | ICD-10-CM | POA: Diagnosis not present

## 2018-01-05 DIAGNOSIS — I2102 ST elevation (STEMI) myocardial infarction involving left anterior descending coronary artery: Secondary | ICD-10-CM

## 2018-01-05 DIAGNOSIS — I2510 Atherosclerotic heart disease of native coronary artery without angina pectoris: Secondary | ICD-10-CM | POA: Diagnosis not present

## 2018-01-05 DIAGNOSIS — E7800 Pure hypercholesterolemia, unspecified: Secondary | ICD-10-CM | POA: Diagnosis not present

## 2018-01-05 DIAGNOSIS — I252 Old myocardial infarction: Secondary | ICD-10-CM | POA: Diagnosis not present

## 2018-01-05 DIAGNOSIS — Z87891 Personal history of nicotine dependence: Secondary | ICD-10-CM | POA: Diagnosis not present

## 2018-01-07 ENCOUNTER — Encounter (HOSPITAL_COMMUNITY)
Admission: RE | Admit: 2018-01-07 | Discharge: 2018-01-07 | Disposition: A | Discharge status: Home / Self Care | Payer: Medicare Other | Source: Ambulatory Visit | Attending: Cardiology | Admitting: Cardiology

## 2018-01-07 DIAGNOSIS — I10 Essential (primary) hypertension: Secondary | ICD-10-CM | POA: Diagnosis not present

## 2018-01-07 DIAGNOSIS — I2510 Atherosclerotic heart disease of native coronary artery without angina pectoris: Secondary | ICD-10-CM | POA: Diagnosis not present

## 2018-01-07 DIAGNOSIS — E7800 Pure hypercholesterolemia, unspecified: Secondary | ICD-10-CM | POA: Diagnosis not present

## 2018-01-07 DIAGNOSIS — I2102 ST elevation (STEMI) myocardial infarction involving left anterior descending coronary artery: Secondary | ICD-10-CM

## 2018-01-07 DIAGNOSIS — Z955 Presence of coronary angioplasty implant and graft: Secondary | ICD-10-CM | POA: Diagnosis not present

## 2018-01-07 DIAGNOSIS — Z87891 Personal history of nicotine dependence: Secondary | ICD-10-CM | POA: Diagnosis not present

## 2018-01-07 DIAGNOSIS — I252 Old myocardial infarction: Secondary | ICD-10-CM | POA: Diagnosis not present

## 2018-01-08 DIAGNOSIS — I252 Old myocardial infarction: Secondary | ICD-10-CM | POA: Diagnosis not present

## 2018-01-08 DIAGNOSIS — I2510 Atherosclerotic heart disease of native coronary artery without angina pectoris: Secondary | ICD-10-CM | POA: Diagnosis not present

## 2018-01-08 DIAGNOSIS — E785 Hyperlipidemia, unspecified: Secondary | ICD-10-CM | POA: Diagnosis not present

## 2018-01-08 DIAGNOSIS — I119 Hypertensive heart disease without heart failure: Secondary | ICD-10-CM | POA: Diagnosis not present

## 2018-01-09 ENCOUNTER — Encounter (HOSPITAL_COMMUNITY)
Admission: RE | Admit: 2018-01-09 | Discharge: 2018-01-09 | Disposition: A | Discharge status: Home / Self Care | Payer: Medicare Other | Source: Ambulatory Visit | Attending: Cardiology | Admitting: Cardiology

## 2018-01-09 DIAGNOSIS — I252 Old myocardial infarction: Secondary | ICD-10-CM | POA: Diagnosis not present

## 2018-01-09 DIAGNOSIS — E7800 Pure hypercholesterolemia, unspecified: Secondary | ICD-10-CM | POA: Diagnosis not present

## 2018-01-09 DIAGNOSIS — I2510 Atherosclerotic heart disease of native coronary artery without angina pectoris: Secondary | ICD-10-CM | POA: Diagnosis not present

## 2018-01-09 DIAGNOSIS — Z955 Presence of coronary angioplasty implant and graft: Secondary | ICD-10-CM

## 2018-01-09 DIAGNOSIS — I10 Essential (primary) hypertension: Secondary | ICD-10-CM | POA: Diagnosis not present

## 2018-01-09 DIAGNOSIS — I2102 ST elevation (STEMI) myocardial infarction involving left anterior descending coronary artery: Secondary | ICD-10-CM

## 2018-01-09 DIAGNOSIS — Z87891 Personal history of nicotine dependence: Secondary | ICD-10-CM | POA: Diagnosis not present

## 2018-01-12 ENCOUNTER — Encounter (HOSPITAL_COMMUNITY)
Admission: RE | Admit: 2018-01-12 | Discharge: 2018-01-12 | Disposition: A | Discharge status: Home / Self Care | Payer: Medicare Other | Source: Ambulatory Visit | Attending: Cardiology | Admitting: Cardiology

## 2018-01-12 DIAGNOSIS — E7800 Pure hypercholesterolemia, unspecified: Secondary | ICD-10-CM | POA: Insufficient documentation

## 2018-01-12 DIAGNOSIS — Z7982 Long term (current) use of aspirin: Secondary | ICD-10-CM | POA: Insufficient documentation

## 2018-01-12 DIAGNOSIS — I252 Old myocardial infarction: Secondary | ICD-10-CM | POA: Diagnosis not present

## 2018-01-12 DIAGNOSIS — Z87891 Personal history of nicotine dependence: Secondary | ICD-10-CM | POA: Diagnosis not present

## 2018-01-12 DIAGNOSIS — I2510 Atherosclerotic heart disease of native coronary artery without angina pectoris: Secondary | ICD-10-CM | POA: Diagnosis not present

## 2018-01-12 DIAGNOSIS — I2102 ST elevation (STEMI) myocardial infarction involving left anterior descending coronary artery: Secondary | ICD-10-CM

## 2018-01-12 DIAGNOSIS — Z7902 Long term (current) use of antithrombotics/antiplatelets: Secondary | ICD-10-CM | POA: Diagnosis not present

## 2018-01-12 DIAGNOSIS — Z79899 Other long term (current) drug therapy: Secondary | ICD-10-CM | POA: Insufficient documentation

## 2018-01-12 DIAGNOSIS — Z86010 Personal history of colonic polyps: Secondary | ICD-10-CM | POA: Diagnosis not present

## 2018-01-12 DIAGNOSIS — I10 Essential (primary) hypertension: Secondary | ICD-10-CM | POA: Insufficient documentation

## 2018-01-12 DIAGNOSIS — Z955 Presence of coronary angioplasty implant and graft: Secondary | ICD-10-CM | POA: Diagnosis not present

## 2018-01-14 ENCOUNTER — Telehealth: Payer: Self-pay | Admitting: Cardiology

## 2018-01-14 ENCOUNTER — Encounter: Payer: Self-pay | Admitting: Cardiology

## 2018-01-14 ENCOUNTER — Encounter (HOSPITAL_COMMUNITY)
Admission: RE | Admit: 2018-01-14 | Discharge: 2018-01-14 | Disposition: A | Discharge status: Home / Self Care | Payer: Medicare Other | Source: Ambulatory Visit | Attending: Cardiology | Admitting: Cardiology

## 2018-01-14 DIAGNOSIS — Z955 Presence of coronary angioplasty implant and graft: Secondary | ICD-10-CM | POA: Diagnosis not present

## 2018-01-14 DIAGNOSIS — E7800 Pure hypercholesterolemia, unspecified: Secondary | ICD-10-CM | POA: Diagnosis not present

## 2018-01-14 DIAGNOSIS — Z87891 Personal history of nicotine dependence: Secondary | ICD-10-CM | POA: Diagnosis not present

## 2018-01-14 DIAGNOSIS — I252 Old myocardial infarction: Secondary | ICD-10-CM | POA: Diagnosis not present

## 2018-01-14 DIAGNOSIS — I10 Essential (primary) hypertension: Secondary | ICD-10-CM | POA: Diagnosis not present

## 2018-01-14 DIAGNOSIS — I2510 Atherosclerotic heart disease of native coronary artery without angina pectoris: Secondary | ICD-10-CM | POA: Diagnosis not present

## 2018-01-14 DIAGNOSIS — I2102 ST elevation (STEMI) myocardial infarction involving left anterior descending coronary artery: Secondary | ICD-10-CM

## 2018-01-14 NOTE — Telephone Encounter (Signed)
Cardiac rehab called regarding pt heart rate 42-43 at rest, blood pressure 138/60.  Patient is asymptomatic.  Heart rate up to only 69 with exercise.  Patient reports heart rate is about 47 at home when he checks on his blood pressure monitor.  I have instructed the patient to decrease his carvedilol from 6.25 mg back to 3.125 mg.  He will cut his pill in half.  I will send this to Dr. Ellyn Hack for review.

## 2018-01-14 NOTE — Progress Notes (Signed)
Resting heart rate noted at 42 this morning at phase 2 cardiac rehab. Patient asymptomatic. Blood pressure 138/60. Patient has proceeded with exercise with out complaints. Maximum heart rate noted in the 70's. Pecolia Ades NP paged and notified. Gae Bon spoke with Carloyn Manner over the phone and instructed him to decrease his coreg to 3.125 mg twice a day. Will fax exercise flow sheets to Dr. Darcus Pester office for review with today's ECG tracings.[name], RN,BSN 01/14/2018 10:14 AM

## 2018-01-15 MED ORDER — CARVEDILOL 6.25 MG PO TABS: 3.1250 mg | ORAL_TABLET | Freq: Two times a day (BID) | ORAL | 6 refills | Status: DC

## 2018-01-15 MED ORDER — AMLODIPINE BESYLATE 10 MG PO TABS: 10.0000 mg | ORAL_TABLET | Freq: Every day | ORAL | 3 refills | Status: DC

## 2018-01-15 NOTE — Telephone Encounter (Signed)
Spoke with patient . Aware to discontinue amlodipine 5 mg now .  start taking amlodipine 10 mg daily. Patient request 90 day supply.   E-sent to pharmacy.  medication list was changed to reflect new  Direction for carvedilol 3.125 mg( 1/2 tablet of 6.25 mg) twice a day (did not send a new prescription to pharmacy)    patient away of both changes

## 2018-01-15 NOTE — Telephone Encounter (Signed)
OK - I guess we cannot increase his Beta Blocker any more. Since his BP was still high - I would like to increase Amlodipine to 10 mg from 5 mg.  I am routing through Trixie Dredge, Therapist, sports.  We can contact him.  Glenetta Hew, MD

## 2018-01-15 NOTE — Addendum Note (Signed)
Addended by: Raiford Simmonds on: 01/15/2018 11:48 AM   Modules accepted: Orders

## 2018-01-16 ENCOUNTER — Encounter (HOSPITAL_COMMUNITY)
Admission: RE | Admit: 2018-01-16 | Discharge: 2018-01-16 | Disposition: A | Discharge status: Home / Self Care | Payer: Medicare Other | Source: Ambulatory Visit | Attending: Cardiology | Admitting: Cardiology

## 2018-01-16 DIAGNOSIS — Z955 Presence of coronary angioplasty implant and graft: Secondary | ICD-10-CM | POA: Diagnosis not present

## 2018-01-16 DIAGNOSIS — Z87891 Personal history of nicotine dependence: Secondary | ICD-10-CM | POA: Diagnosis not present

## 2018-01-16 DIAGNOSIS — I10 Essential (primary) hypertension: Secondary | ICD-10-CM | POA: Diagnosis not present

## 2018-01-16 DIAGNOSIS — I252 Old myocardial infarction: Secondary | ICD-10-CM | POA: Diagnosis not present

## 2018-01-16 DIAGNOSIS — E7800 Pure hypercholesterolemia, unspecified: Secondary | ICD-10-CM | POA: Diagnosis not present

## 2018-01-16 DIAGNOSIS — I2510 Atherosclerotic heart disease of native coronary artery without angina pectoris: Secondary | ICD-10-CM | POA: Diagnosis not present

## 2018-01-16 DIAGNOSIS — I2102 ST elevation (STEMI) myocardial infarction involving left anterior descending coronary artery: Secondary | ICD-10-CM

## 2018-01-17 ENCOUNTER — Telehealth: Payer: Self-pay | Admitting: Medical

## 2018-01-17 NOTE — Telephone Encounter (Signed)
Received a call from Travis Key requesting a refill of his Brilinta. He states a 3 month supply was shipped to him last week, however he has not received it in the mail. Today is his 2nd day without Brilinta. He was hopeful his medication would arrive in the mail today but it did not. Also, his insurance will not cover the cost of a 30d supply given recent refill (via mail). Will call in a 3 day supply to [facility] to tide him over until his mail ordered prescription arrives. Patient will call the office on Tuesday if he needs additional medications. If chest pain occurs, he will present to the ED.

## 2018-01-19 ENCOUNTER — Encounter (HOSPITAL_COMMUNITY)
Admission: RE | Admit: 2018-01-19 | Discharge: 2018-01-19 | Disposition: A | Discharge status: Home / Self Care | Payer: Medicare Other | Source: Ambulatory Visit | Attending: Cardiology | Admitting: Cardiology

## 2018-01-19 DIAGNOSIS — Z955 Presence of coronary angioplasty implant and graft: Secondary | ICD-10-CM

## 2018-01-19 DIAGNOSIS — E7800 Pure hypercholesterolemia, unspecified: Secondary | ICD-10-CM | POA: Diagnosis not present

## 2018-01-19 DIAGNOSIS — I2510 Atherosclerotic heart disease of native coronary artery without angina pectoris: Secondary | ICD-10-CM | POA: Diagnosis not present

## 2018-01-19 DIAGNOSIS — Z87891 Personal history of nicotine dependence: Secondary | ICD-10-CM | POA: Diagnosis not present

## 2018-01-19 DIAGNOSIS — I2102 ST elevation (STEMI) myocardial infarction involving left anterior descending coronary artery: Secondary | ICD-10-CM

## 2018-01-19 DIAGNOSIS — I252 Old myocardial infarction: Secondary | ICD-10-CM | POA: Diagnosis not present

## 2018-01-19 DIAGNOSIS — I10 Essential (primary) hypertension: Secondary | ICD-10-CM | POA: Diagnosis not present

## 2018-01-21 ENCOUNTER — Encounter (HOSPITAL_COMMUNITY)
Admission: RE | Admit: 2018-01-21 | Discharge: 2018-01-21 | Disposition: A | Discharge status: Home / Self Care | Payer: Medicare Other | Source: Ambulatory Visit | Attending: Cardiology | Admitting: Cardiology

## 2018-01-21 DIAGNOSIS — Z87891 Personal history of nicotine dependence: Secondary | ICD-10-CM | POA: Diagnosis not present

## 2018-01-21 DIAGNOSIS — E7800 Pure hypercholesterolemia, unspecified: Secondary | ICD-10-CM | POA: Diagnosis not present

## 2018-01-21 DIAGNOSIS — Z955 Presence of coronary angioplasty implant and graft: Secondary | ICD-10-CM

## 2018-01-21 DIAGNOSIS — I2510 Atherosclerotic heart disease of native coronary artery without angina pectoris: Secondary | ICD-10-CM | POA: Diagnosis not present

## 2018-01-21 DIAGNOSIS — I10 Essential (primary) hypertension: Secondary | ICD-10-CM | POA: Diagnosis not present

## 2018-01-21 DIAGNOSIS — I2102 ST elevation (STEMI) myocardial infarction involving left anterior descending coronary artery: Secondary | ICD-10-CM

## 2018-01-21 DIAGNOSIS — I252 Old myocardial infarction: Secondary | ICD-10-CM | POA: Diagnosis not present

## 2018-01-22 NOTE — Progress Notes (Signed)
Travis Key was noted to have increased ectopy yesterday. Patient asymptomatic. Resting blood pressure within normal limits. Blood pressure 160/82 on the airdyne. Exit heart rate 54. Blood pressure 120/78. Yesterday's  ECG tracings with exercise flow sheets faxed to Dr Allison Quarry office for review.[name], RN,BSN 01/22/2018 11:20 AM

## 2018-01-23 ENCOUNTER — Encounter (HOSPITAL_COMMUNITY)
Admission: RE | Admit: 2018-01-23 | Discharge: 2018-01-23 | Disposition: A | Discharge status: Home / Self Care | Payer: Medicare Other | Source: Ambulatory Visit | Attending: Cardiology | Admitting: Cardiology

## 2018-01-23 DIAGNOSIS — Z955 Presence of coronary angioplasty implant and graft: Secondary | ICD-10-CM

## 2018-01-23 DIAGNOSIS — E7800 Pure hypercholesterolemia, unspecified: Secondary | ICD-10-CM | POA: Diagnosis not present

## 2018-01-23 DIAGNOSIS — I2102 ST elevation (STEMI) myocardial infarction involving left anterior descending coronary artery: Secondary | ICD-10-CM

## 2018-01-23 DIAGNOSIS — Z87891 Personal history of nicotine dependence: Secondary | ICD-10-CM | POA: Diagnosis not present

## 2018-01-23 DIAGNOSIS — I2510 Atherosclerotic heart disease of native coronary artery without angina pectoris: Secondary | ICD-10-CM | POA: Diagnosis not present

## 2018-01-23 DIAGNOSIS — I252 Old myocardial infarction: Secondary | ICD-10-CM | POA: Diagnosis not present

## 2018-01-23 DIAGNOSIS — I10 Essential (primary) hypertension: Secondary | ICD-10-CM | POA: Diagnosis not present

## 2018-01-26 ENCOUNTER — Encounter (HOSPITAL_COMMUNITY)
Admission: RE | Admit: 2018-01-26 | Discharge: 2018-01-26 | Disposition: A | Discharge status: Home / Self Care | Payer: Medicare Other | Source: Ambulatory Visit | Attending: Cardiology | Admitting: Cardiology

## 2018-01-26 DIAGNOSIS — Z87891 Personal history of nicotine dependence: Secondary | ICD-10-CM | POA: Diagnosis not present

## 2018-01-26 DIAGNOSIS — E7800 Pure hypercholesterolemia, unspecified: Secondary | ICD-10-CM | POA: Diagnosis not present

## 2018-01-26 DIAGNOSIS — Z955 Presence of coronary angioplasty implant and graft: Secondary | ICD-10-CM

## 2018-01-26 DIAGNOSIS — I10 Essential (primary) hypertension: Secondary | ICD-10-CM | POA: Diagnosis not present

## 2018-01-26 DIAGNOSIS — Z79899 Other long term (current) drug therapy: Secondary | ICD-10-CM | POA: Diagnosis not present

## 2018-01-26 DIAGNOSIS — N390 Urinary tract infection, site not specified: Secondary | ICD-10-CM | POA: Diagnosis not present

## 2018-01-26 DIAGNOSIS — I2102 ST elevation (STEMI) myocardial infarction involving left anterior descending coronary artery: Secondary | ICD-10-CM

## 2018-01-26 DIAGNOSIS — I252 Old myocardial infarction: Secondary | ICD-10-CM | POA: Diagnosis not present

## 2018-01-26 DIAGNOSIS — I2510 Atherosclerotic heart disease of native coronary artery without angina pectoris: Secondary | ICD-10-CM | POA: Diagnosis not present

## 2018-01-26 DIAGNOSIS — E669 Obesity, unspecified: Secondary | ICD-10-CM | POA: Diagnosis not present

## 2018-01-27 NOTE — Progress Notes (Signed)
Cardiac Individual Treatment Plan  Patient Details  Name: Travis Key [name]: [id number] Date of Birth: 09/03/1941 Referring Provider:     CARDIAC REHAB PHASE II ORIENTATION from 11/20/2017 in [place]  Referring Provider  Leonie Man MD      Initial Encounter Date:    CARDIAC REHAB PHASE II ORIENTATION from 11/20/2017 in [place]  Date  11/20/17  Referring Provider  Leonie Man MD      Visit Diagnosis: Status post coronary artery stent placement DES LAD 09/14/17  ST elevation myocardial infarction involving left anterior descending (LAD) coronary artery ([place]) 09/14/17  Patient's Home Medications on Admission:  Current Outpatient Medications:  .  amLODipine (NORVASC) 10 MG tablet, Take 1 tablet (10 mg total) by mouth daily., Disp: 90 tablet, Rfl: 3 .  aspirin 81 MG chewable tablet, Chew 1 tablet (81 mg total) by mouth daily., Disp: , Rfl:  .  carvedilol (COREG) 6.25 MG tablet, Take 0.5 tablets (3.125 mg total) by mouth 2 (two) times daily., Disp: 60 tablet, Rfl: 6 .  irbesartan (AVAPRO) 300 MG tablet, Take 300 mg by mouth daily., Disp: , Rfl:  .  nitroGLYCERIN (NITROSTAT) 0.4 MG SL tablet, Place 1 tablet (0.4 mg total) under the tongue every 5 (five) minutes as needed., Disp: 25 tablet, Rfl: 2 .  rosuvastatin (CRESTOR) 40 MG tablet, Take 0.5 tablets (20 mg total) by mouth at bedtime. (Patient taking differently: Take 40 mg by mouth 2 (two) times daily at 8 am and 10 pm. ), Disp: 90 tablet, Rfl: 1 .  tetrahydrozoline 0.05 % ophthalmic solution, Place 1 drop into both eyes daily., Disp: , Rfl:  .  ticagrelor (BRILINTA) 90 MG TABS tablet, Take 1 tablet (90 mg total) by mouth 2 (two) times daily., Disp: 180 tablet, Rfl: 2  Past Medical History: Past Medical History:  Diagnosis Date  . Adenomatous polyp of colon 08/2003  . Anal fissure   . CAD S/P LAD PCI for Antero-Posterior STEMI    12/18 STEMI PCI/DESx1 mLAD, CTO  of RCA, normal EF  . Diverticulosis   . High cholesterol   . History of myocardial infarction: Anterior - posterior MI (LAD lesion - PCI) 09/14/2017  . Hypertension   . Internal hemorrhoids     Tobacco Use: Social History   Tobacco Use  Smoking Status Former Smoker  . Types: Cigarettes  . Last attempt to quit: 03/02/1985  . Years since quitting: 32.9  Smokeless Tobacco Never Used    Labs: Recent Chemical engineer    Labs for ITP Cardiac and Pulmonary Rehab Latest Ref Rng & Units 09/14/2017 09/15/2017 12/01/2017   Cholestrol 100 - 199 mg/dL 159 131 121   LDLCALC 0 - 99 mg/dL 94 75 61   HDL >39 mg/dL 43 38(L) 45   Trlycerides 0 - 149 mg/dL 108 90 74   Hemoglobin A1c 4.8 - 5.6 % - 5.5 -      Capillary Blood Glucose: No results found for: GLUCAP   Exercise Target Goals:    Exercise Program Goal: Individual exercise prescription set using results from initial 6 min walk test and THRR while considering  patient's activity barriers and safety.   Exercise Prescription Goal: Initial exercise prescription builds to 30-45 minutes a day of aerobic activity, 2-3 days per week.  Home exercise guidelines will be given to patient during program as part of exercise prescription that the participant will acknowledge.  Activity Barriers & Risk  Stratification: Activity Barriers & Cardiac Risk Stratification - 11/20/17 0855      Activity Barriers & Cardiac Risk Stratification   Activity Barriers  None    Cardiac Risk Stratification  High       6 Minute Walk: 6 Minute Walk    Row Name 11/20/17 1102         6 Minute Walk   Phase  Initial     Distance  1600 feet     Walk Time  6 minutes     # of Rest Breaks  0     MPH  3     METS  2.8     RPE  11     Perceived Dyspnea   0     VO2 Peak  10.1     Symptoms  No     Resting HR  58 bpm     Resting BP  142/80     Resting Oxygen Saturation   96 %     Exercise Oxygen Saturation  during 6 min walk  99 %     Max Ex. HR  86 bpm      Max Ex. BP  160/80     2 Minute Post BP  148/80        Oxygen Initial Assessment:   Oxygen Re-Evaluation:   Oxygen Discharge (Final Oxygen Re-Evaluation):   Initial Exercise Prescription: Initial Exercise Prescription - 11/20/17 1100      Date of Initial Exercise RX and Referring Provider   Date  11/20/17    Referring Provider  Leonie Man MD      Treadmill   MPH  2.5    Grade  0    Minutes  10    METs  2.91      Bike   Level  0.8    Minutes  10    METs  2.7      NuStep   Level  3    SPM  75    Minutes  10    METs  2.8      Prescription Details   Frequency (times per week)  3x    Duration  Progress to 30 minutes of continuous aerobic without signs/symptoms of physical distress      Intensity   THRR 40-80% of Max Heartrate  58-115    Ratings of Perceived Exertion  11-13    Perceived Dyspnea  0-4      Progression   Progression  Continue progressive overload as per policy without signs/symptoms or physical distress.      Resistance Training   Training Prescription  Yes    Weight  4lbs    Reps  10-15       Perform Capillary Blood Glucose checks as needed.  Exercise Prescription Changes:  Exercise Prescription Changes    Row Name 11/24/17 [phone] 12/08/17 0950 12/26/17 0949 01/05/18 0949 01/19/18 0954     Response to Exercise   Blood Pressure (Admit)  128/80  128/80  114/74  126/74  124/80   Blood Pressure (Exercise)  150/84  146/82  162/78  128/68  142/80   Blood Pressure (Exit)  122/72  128/64  108/70  108/60  110/60   Heart Rate (Admit)  57 bpm  58 bpm  48 bpm  44 bpm  51 bpm   Heart Rate (Exercise)  104 bpm  104 bpm  108 bpm  103 bpm  104 bpm   Heart Rate (Exit)  57  bpm  58 bpm  59 bpm  44 bpm  57 bpm   Rating of Perceived Exertion (Exercise)  13  13  13  13  12    Symptoms  none  none  none  none  none   Duration  Continue with 30 min of aerobic exercise without signs/symptoms of physical distress.  Continue with 30 min of aerobic exercise  without signs/symptoms of physical distress.  Continue with 30 min of aerobic exercise without signs/symptoms of physical distress.  Continue with 30 min of aerobic exercise without signs/symptoms of physical distress.  Continue with 30 min of aerobic exercise without signs/symptoms of physical distress.   Intensity  THRR unchanged  THRR unchanged  THRR unchanged  THRR unchanged  THRR unchanged     Progression   Progression  Continue to progress workloads to maintain intensity without signs/symptoms of physical distress.  Continue to progress workloads to maintain intensity without signs/symptoms of physical distress.  Continue to progress workloads to maintain intensity without signs/symptoms of physical distress.  Continue to progress workloads to maintain intensity without signs/symptoms of physical distress.  Continue to progress workloads to maintain intensity without signs/symptoms of physical distress.   Average METs  3.3  3.7  5.2  5.1  4.8     Resistance Training   Training Prescription  Yes  Yes  Yes  Yes  Yes   Weight  4lbs  4lbs  5lbs  5lbs  6lbs   Reps  10-15  10-15  10-15  10-15  10-15   Time  10 Minutes  10 Minutes  10 Minutes  10 Minutes  10 Minutes     Interval Training   Interval Training  No  No  No  No  No     Treadmill   MPH  2.5  3  3.5  3.5  3.5   Grade  0  1  2  2  2    Minutes  10  10  10  10  10    METs  2.91  3.71  4.65  4.65  4.65     Bike   Level  2  2  2.8  2.8  2.8   Minutes  10  10  10  10  10    METs  4.39  4.77  7.06  6.31  6.34     NuStep   Level  3  4  5  5  5    SPM  75  75  95  95  95   Minutes  10  10  10  10  10    METs  2.7  2.5  4  4.2  3.4     Home Exercise Plan   Plans to continue exercise at  -  Home (comment)  Home (comment)  Home (comment)  Home (comment)   Frequency  -  Add 4 additional days to program exercise sessions.  Add 4 additional days to program exercise sessions.  Add 4 additional days to program exercise sessions.  Add 4  additional days to program exercise sessions.   Initial Home Exercises Provided  -  12/01/17  12/01/17  12/01/17  12/01/17      Exercise Comments:  Exercise Comments    Row Name 11/26/17 [phone] 12/08/17 0950 12/29/17 1015 01/05/18 1014 01/19/18 0954   Exercise Comments  Reviewed METs and goals with patient. Patient is walking daily and has equipment at home.  Reviewed METs with patient.   Reviewed METs and  goals with patient.   Reviewed METs goals with patient.   Reviewed METs and goals with patient.       Exercise Goals and Review:  Exercise Goals    Row Name 11/20/17 1106             Exercise Goals   Increase Physical Activity  Yes       Intervention  Provide advice, education, support and counseling about physical activity/exercise needs.;Develop an individualized exercise prescription for aerobic and resistive training based on initial evaluation findings, risk stratification, comorbidities and participant's personal goals.       Expected Outcomes  Short Term: Attend rehab on a regular basis to increase amount of physical activity.;Long Term: Add in home exercise to make exercise part of routine and to increase amount of physical activity.;Long Term: Exercising regularly at least 3-5 days a week.       Increase Strength and Stamina  Yes       Intervention  Provide advice, education, support and counseling about physical activity/exercise needs.;Develop an individualized exercise prescription for aerobic and resistive training based on initial evaluation findings, risk stratification, comorbidities and participant's personal goals.       Expected Outcomes  Short Term: Increase workloads from initial exercise prescription for resistance, speed, and METs.;Short Term: Perform resistance training exercises routinely during rehab and add in resistance training at home;Long Term: Improve cardiorespiratory fitness, muscular endurance and strength as measured by increased METs and functional  capacity (6MWT)       Able to understand and use rate of perceived exertion (RPE) scale  Yes       Intervention  Provide education and explanation on how to use RPE scale       Expected Outcomes  Short Term: Able to use RPE daily in rehab to express subjective intensity level;Long Term:  Able to use RPE to guide intensity level when exercising independently       Able to understand and use Dyspnea scale  Yes       Intervention  Provide education and explanation on how to use Dyspnea scale       Expected Outcomes  Short Term: Able to use Dyspnea scale daily in rehab to express subjective sense of shortness of breath during exertion;Long Term: Able to use Dyspnea scale to guide intensity level when exercising independently       Knowledge and understanding of Target Heart Rate Range (THRR)  Yes       Intervention  Provide education and explanation of THRR including how the numbers were predicted and where they are located for reference       Expected Outcomes  Short Term: Able to use daily as guideline for intensity in rehab;Short Term: Able to state/look up THRR;Long Term: Able to use THRR to govern intensity when exercising independently       Able to check pulse independently  Yes       Intervention  Review the importance of being able to check your own pulse for safety during independent exercise;Provide education and demonstration on how to check pulse in carotid and radial arteries.       Expected Outcomes  Short Term: Able to explain why pulse checking is important during independent exercise;Long Term: Able to check pulse independently and accurately       Understanding of Exercise Prescription  Yes       Intervention  Provide education, explanation, and written materials on patient's individual exercise prescription  Expected Outcomes  Short Term: Able to explain program exercise prescription;Long Term: Able to explain home exercise prescription to exercise independently           Exercise Goals Re-Evaluation : Exercise Goals Re-Evaluation    Row Name 11/24/17 0949 12/01/17 1000 12/26/17 0949 01/19/18 0954       Exercise Goal Re-Evaluation   Exercise Goals Review  Able to understand and use rate of perceived exertion (RPE) scale  Understanding of Exercise Prescription;Knowledge and understanding of Target Heart Rate Range (THRR)  Increase Strength and Stamina  Increase Physical Activity    Comments  Off to a good start with exercise. Patient understands and is able to use RPE scale appropriately.  Reviewed home exericse guidelines, including THRR, RPE scale, and endpoints for exercise. Patient has a gym at home including a bike, treadmill and rower. Pt also walks daily.   Patient continues to progress well with exercise achieving 5.2 METs. Patient states he's walking a 20 minutes mile and walks 2 miles at least 2 days/week.  Patient states he hasn't been doing as well with his home exercise routine, he's been busy. Pt has equipment at home and plans to get back to walking 2-3 days/week at home.    Expected Outcomes  Increase workloads as tolerated to help achieve health and fitness goals.  Continue daily exercise, 30 minutes to help increase strength and stamina.  Increase workloads and continue exercise at least 30 minutes, 5 days/week to help achieve personals health and fitness goals.  Patient will get back into walking or using his equipment at home at least 2-3 days/week to supplement his exercise at [facility].        Discharge Exercise Prescription (Final Exercise Prescription Changes): Exercise Prescription Changes - 01/19/18 0954      Response to Exercise   Blood Pressure (Admit)  124/80    Blood Pressure (Exercise)  142/80    Blood Pressure (Exit)  110/60    Heart Rate (Admit)  51 bpm    Heart Rate (Exercise)  104 bpm    Heart Rate (Exit)  57 bpm    Rating of Perceived Exertion (Exercise)  12    Symptoms  none    Duration  Continue with 30 min of aerobic  exercise without signs/symptoms of physical distress.    Intensity  THRR unchanged      Progression   Progression  Continue to progress workloads to maintain intensity without signs/symptoms of physical distress.    Average METs  4.8      Resistance Training   Training Prescription  Yes    Weight  6lbs    Reps  10-15    Time  10 Minutes      Interval Training   Interval Training  No      Treadmill   MPH  3.5    Grade  2    Minutes  10    METs  4.65      Bike   Level  2.8    Minutes  10    METs  6.34      NuStep   Level  5    SPM  95    Minutes  10    METs  3.4      Home Exercise Plan   Plans to continue exercise at  Home (comment)    Frequency  Add 4 additional days to program exercise sessions.    Initial Home Exercises Provided  12/01/17  Nutrition:  Target Goals: Understanding of nutrition guidelines, daily intake of sodium 1500mg , cholesterol 200mg , calories 30% from fat and 7% or less from saturated fats, daily to have 5 or more servings of fruits and vegetables.  Biometrics: Pre Biometrics - 11/20/17 1106      Pre Biometrics   Height  5\' 9"  (1.753 m)    Weight  219 lb 2.2 oz (99.4 kg)    Waist Circumference  44 inches    Hip Circumference  45 inches    Waist to Hip Ratio  0.98 %    BMI (Calculated)  32.35    Triceps Skinfold  32 mm    % Body Fat  34.5 %    Grip Strength  53 kg    Flexibility  9 in    Single Leg Stand  30 seconds        Nutrition Therapy Plan and Nutrition Goals: Nutrition Therapy & Goals - 11/20/17 1106      Nutrition Therapy   Diet  Heart Healthy      Personal Nutrition Goals   Nutrition Goal  Wt loss of 1-2 lb/week to a wt loss goal of 6-24 lb at graduation from [place]. Goal wt of 200 lb desired.       Intervention Plan   Intervention  Prescribe, educate and counsel regarding individualized specific dietary modifications aiming towards targeted core components such as weight, hypertension, lipid management,  diabetes, heart failure and other comorbidities.    Expected Outcomes  Short Term Goal: Understand basic principles of dietary content, such as calories, fat, sodium, cholesterol and nutrients.;Long Term Goal: Adherence to prescribed nutrition plan.       Nutrition Assessments: Nutrition Assessments - 11/20/17 1105      MEDFICTS Scores   Pre Score  24       Nutrition Goals Re-Evaluation:   Nutrition Goals Re-Evaluation:   Nutrition Goals Discharge (Final Nutrition Goals Re-Evaluation):   Psychosocial: Target Goals: Acknowledge presence or absence of significant depression and/or stress, maximize coping skills, provide positive support system. Participant is able to verbalize types and ability to use techniques and skills needed for reducing stress and depression.  Initial Review & Psychosocial Screening: Initial Psych Review & Screening - 11/20/17 1149      Initial Review   Current issues with  None Identified      Family Dynamics   Good Support System?  Yes children, family members and friends      Barriers   Psychosocial barriers to participate in program  There are no identifiable barriers or psychosocial needs.      Screening Interventions   Interventions  Encouraged to exercise       Quality of Life Scores: Quality of Life - 11/20/17 1107      Quality of Life Scores   Health/Function Pre  24.85 %    Socioeconomic Pre  26.43 %    Psych/Spiritual Pre  24.71 %    Family Pre  26.4 %    GLOBAL Pre  25.37 %      Scores of 19 and below usually indicate a poorer quality of life in these areas.  A difference of  2-3 points is a clinically meaningful difference.  A difference of 2-3 points in the total score of the Quality of Life Index has been associated with significant improvement in overall quality of life, self-image, physical symptoms, and general health in studies assessing change in quality of life.  PHQ-9: Recent Review Flowsheet Data  Depression screen  [facility] 2/9 11/24/2017   Decreased Interest 0   Down, Depressed, Hopeless 0   PHQ - 2 Score 0     Interpretation of Total Score  Total Score Depression Severity:  1-4 = Minimal depression, 5-9 = Mild depression, 10-14 = Moderate depression, 15-19 = Moderately severe depression, 20-27 = Severe depression   Psychosocial Evaluation and Intervention:   Psychosocial Re-Evaluation: Psychosocial Re-Evaluation    [place] Name 12/02/17 1405 01/01/18 0934 01/27/18 1354         Psychosocial Re-Evaluation   Current issues with  None Identified  None Identified  None Identified     Interventions  Encouraged to attend Cardiac Rehabilitation for the exercise  Encouraged to attend Cardiac Rehabilitation for the exercise  Encouraged to attend Cardiac Rehabilitation for the exercise     Continue Psychosocial Services   No Follow up required  No Follow up required  No Follow up required        Psychosocial Discharge (Final Psychosocial Re-Evaluation): Psychosocial Re-Evaluation - 01/27/18 1354      Psychosocial Re-Evaluation   Current issues with  None Identified    Interventions  Encouraged to attend Cardiac Rehabilitation for the exercise    Continue Psychosocial Services   No Follow up required       Vocational Rehabilitation: Provide vocational rehab assistance to qualifying candidates.   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 Participant will state understanding/return demonstration of topics presented.  Learning Barriers/Preferences: Learning Barriers/Preferences - 11/20/17 0856      Learning Barriers/Preferences   Learning Preferences  Written  Material;Audio;Video       Education Topics: Count Your Pulse:  -Group instruction provided by verbal instruction, demonstration, patient participation and written materials to support subject.  Instructors address importance of being able to find your pulse and how to count your pulse when at home without a heart monitor.  Patients get hands on experience counting their pulse with staff help and individually.   Heart Attack, Angina, and Risk Factor Modification:  -Group instruction provided by verbal instruction, video, and written materials to support subject.  Instructors address signs and symptoms of angina and heart attacks.    Also discuss risk factors for heart disease and how to make changes to improve heart health risk factors.   CARDIAC REHAB PHASE II EXERCISE from 01/21/2018 in [place]  Date  12/10/17  Instruction Review Code  2- Demonstrated Understanding      Functional Fitness:  -Group instruction provided by verbal instruction, demonstration, patient participation, and written materials to support subject.  Instructors address safety measures for doing things around the house.  Discuss how to get up and down off the floor, how to pick things up properly, how to safely get out of a chair without assistance, and balance training.   Meditation and Mindfulness:  -Group instruction provided by verbal instruction, patient participation, and written materials to support subject.  Instructor addresses importance of mindfulness and meditation practice to help reduce stress and improve awareness.  Instructor also leads participants through a meditation exercise.    Stretching for Flexibility and Mobility:  -Group instruction  provided by verbal instruction, patient participation, and written materials to support subject.  Instructors lead participants through series of stretches that are designed to increase flexibility thus improving mobility.  These  stretches are additional exercise for major muscle groups that are typically performed during regular warm up and cool down.   Hands Only CPR:  -Group verbal, video, and participation provides a basic overview of AHA guidelines for community CPR. Role-play of emergencies allow participants the opportunity to practice calling for help and chest compression technique with discussion of AED use.   Hypertension: -Group verbal and written instruction that provides a basic overview of hypertension including the most recent diagnostic guidelines, risk factor reduction with self-care instructions and medication management.    Nutrition I class: Heart Healthy Eating:  -Group instruction provided by PowerPoint slides, verbal discussion, and written materials to support subject matter. The instructor gives an explanation and review of the Therapeutic Lifestyle Changes diet recommendations, which includes a discussion on lipid goals, dietary fat, sodium, fiber, plant stanol/sterol esters, sugar, and the components of a well-balanced, healthy diet.   CARDIAC REHAB PHASE II EXERCISE from 01/21/2018 in [place]  Date  12/12/17  Educator  RD      Nutrition II class: Lifestyle Skills:  -Group instruction provided by PowerPoint slides, verbal discussion, and written materials to support subject matter. The instructor gives an explanation and review of label reading, grocery shopping for heart health, heart healthy recipe modifications, and ways to make healthier choices when eating out.   CARDIAC REHAB PHASE II EXERCISE from 01/21/2018 in [place]  Date  12/12/17  Educator  RD      Diabetes Question & Answer:  -Group instruction provided by PowerPoint slides, verbal discussion, and written materials to support subject matter. The instructor gives an explanation and review of diabetes co-morbidities, pre- and post-prandial blood glucose  goals, pre-exercise blood glucose goals, signs, symptoms, and treatment of hypoglycemia and hyperglycemia, and foot care basics.   Diabetes Blitz:  -Group instruction provided by PowerPoint slides, verbal discussion, and written materials to support subject matter. The instructor gives an explanation and review of the physiology behind type 1 and type 2 diabetes, diabetes medications and rational behind using different medications, pre- and post-prandial blood glucose recommendations and Hemoglobin A1c goals, diabetes diet, and exercise including blood glucose guidelines for exercising safely.    Portion Distortion:  -Group instruction provided by PowerPoint slides, verbal discussion, written materials, and food models to support subject matter. The instructor gives an explanation of serving size versus portion size, changes in portions sizes over the last 20 years, and what consists of a serving from each food group.   CARDIAC REHAB PHASE II EXERCISE from 01/21/2018 in [place]  Date  01/07/18  Educator  RD  Instruction Review Code  2- Demonstrated Understanding      Stress Management:  -Group instruction provided by verbal instruction, video, and written materials to support subject matter.  Instructors review role of stress in heart disease and how to cope with stress positively.     CARDIAC REHAB PHASE II EXERCISE from 01/21/2018 in [place]  Date  01/14/18  Instruction Review Code  2- Demonstrated Understanding      Exercising on Your Own:  -Group instruction provided by verbal instruction, power point, and written materials to support subject.  Instructors discuss benefits of exercise, components of exercise, frequency and intensity  of exercise, and end points for exercise.  Also discuss use of nitroglycerin and activating EMS.  Review options of places to exercise outside of rehab.  Review guidelines for sex with heart  disease.   CARDIAC REHAB PHASE II EXERCISE from 01/21/2018 in [place]  Date  12/17/17  Instruction Review Code  2- Demonstrated Understanding      Cardiac Drugs I:  -Group instruction provided by verbal instruction and written materials to support subject.  Instructor reviews cardiac drug classes: antiplatelets, anticoagulants, beta blockers, and statins.  Instructor discusses reasons, side effects, and lifestyle considerations for each drug class.   Cardiac Drugs II:  -Group instruction provided by verbal instruction and written materials to support subject.  Instructor reviews cardiac drug classes: angiotensin converting enzyme inhibitors (ACE-I), angiotensin II receptor blockers (ARBs), nitrates, and calcium channel blockers.  Instructor discusses reasons, side effects, and lifestyle considerations for each drug class.   CARDIAC REHAB PHASE II EXERCISE from 01/21/2018 in [place]  Date  01/21/18  Instruction Review Code  2- Demonstrated Understanding      Anatomy and Physiology of the Circulatory System:  Group verbal and written instruction and models provide basic cardiac anatomy and physiology, with the coronary electrical and arterial systems. Review of: AMI, Angina, Valve disease, Heart Failure, Peripheral Artery Disease, Cardiac Arrhythmia, Pacemakers, and the ICD.   CARDIAC REHAB PHASE II EXERCISE from 01/21/2018 in [place]  Date  12/03/17  Educator  RN  Instruction Review Code  2- Demonstrated Understanding      Other Education:  -Group or individual verbal, written, or video instructions that support the educational goals of the cardiac rehab program.   Holiday Eating Survival Tips:  -Group instruction provided by PowerPoint slides, verbal discussion, and written materials to support subject matter. The instructor gives patients tips, tricks, and techniques to help them  not only survive but enjoy the holidays despite the onslaught of food that accompanies the holidays.   Knowledge Questionnaire Score: Knowledge Questionnaire Score - 11/20/17 1107      Knowledge Questionnaire Score   Pre Score  19/24       Core Components/Risk Factors/Patient Goals at Admission: Personal Goals and Risk Factors at Admission - 11/20/17 1100      Core Components/Risk Factors/Patient Goals on Admission    Weight Management  Yes;Weight Loss    Intervention  Weight Management: Develop a combined nutrition and exercise program designed to reach desired caloric intake, while maintaining appropriate intake of nutrient and fiber, sodium and fats, and appropriate energy expenditure required for the weight goal.;Weight Management: Provide education and appropriate resources to help participant work on and attain dietary goals.;Weight Management/Obesity: Establish reasonable short term and long term weight goals.;Obesity: Provide education and appropriate resources to help participant work on and attain dietary goals.    Admit Weight  218 lb 11.2 oz (99.2 kg)    Goal Weight: Short Term  213 lb (96.6 kg)    Goal Weight: Long Term  208 lb (94.3 kg)    Expected Outcomes  Short Term: Continue to assess and modify interventions until short term weight is achieved;Long Term: Adherence to nutrition and physical activity/exercise program aimed toward attainment of established weight goal;Weight Loss: Understanding of general recommendations for a balanced deficit meal plan, which promotes 1-2 lb weight loss per week and includes a negative energy balance of [phone] kcal/d;Understanding recommendations for meals to include 15-35% energy as protein,  25-35% energy from fat, 35-60% energy from carbohydrates, less than 200mg  of dietary cholesterol, 20-35 gm of total fiber daily;Understanding of distribution of calorie intake throughout the day with the consumption of 4-5 meals/snacks    Hypertension   Yes    Intervention  Provide education on lifestyle modifcations including regular physical activity/exercise, weight management, moderate sodium restriction and increased consumption of fresh fruit, vegetables, and low fat dairy, alcohol moderation, and smoking cessation.;Monitor prescription use compliance.    Expected Outcomes  Short Term: Continued assessment and intervention until BP is < 140/5mm HG in hypertensive participants. < 130/70mm HG in hypertensive participants with diabetes, heart failure or chronic kidney disease.;Long Term: Maintenance of blood pressure at goal levels.    Lipids  Yes    Intervention  Provide education and support for participant on nutrition & aerobic/resistive exercise along with prescribed medications to achieve LDL 70mg , HDL >40mg .    Expected Outcomes  Short Term: Participant states understanding of desired cholesterol values and is compliant with medications prescribed. Participant is following exercise prescription and nutrition guidelines.;Long Term: Cholesterol controlled with medications as prescribed, with individualized exercise RX and with personalized nutrition plan. Value goals: LDL < 70mg , HDL > 40 mg.       Core Components/Risk Factors/Patient Goals Review:  Goals and Risk Factor Review    Row Name 12/02/17 1400 01/01/18 0931 01/27/18 1350 01/27/18 1356       Core Components/Risk Factors/Patient Goals Review   Personal Goals Review  Weight Management/Obesity;Lipids;Hypertension  Weight Management/Obesity;Lipids;Hypertension  Weight Management/Obesity;Lipids;Hypertension  -    Review  Emily is off to a good start to exercise. Some intermittent exertional BP elevations have been noted will contnue to monitor  Masaru is off to a good start to exercise. Some intermittent exertional BP elevations have been noted will contnue to monitor   Gurtej's blood pressures have been improved since Dr harding increased his lisinopril. Marius continues to do well with exercise    Ammiel's blood pressures have been improved since Dr Ellyn Hack increased his amloddipine. Eriberto continues to do well with exercise    Expected Outcomes  Christain will continue to particpate in phase 2 cardiac rehab and takes medications for HTN and Hyperlipidemia as prescribed  Lott will continue to particpate in phase 2 cardiac rehab and takes medications for HTN and Hyperlipidemia as prescribed  Jemar will continue to particpate in phase 2 cardiac rehab and takes medications for HTN and Hyperlipidemia as prescribed  -       Core Components/Risk Factors/Patient Goals at Discharge (Final Review):  Goals and Risk Factor Review - 01/27/18 1356      Core Components/Risk Factors/Patient Goals Review   Review   Brentton's blood pressures have been improved since Dr Ellyn Hack increased his amloddipine. Marjorie continues to do well with exercise       ITP Comments: ITP Comments    Row Name 11/20/17 0857 12/02/17 1404 01/01/18 0931 01/27/18 1350     ITP Comments  Dr. Fransico Him, Medical Director  30 Day ITP Review. Patient with good attendance and participation at phase 2 cardiac rehab  30 Day ITP Review. Patient with good attendance and participation at phase 2 cardiac rehab  30 Day ITP Review. Patient with good attendance and participation at phase 2 cardiac rehab       Comments: See ITP comments.[name], RN,BSN 01/27/2018 1:58 PM

## 2018-01-28 ENCOUNTER — Encounter (HOSPITAL_COMMUNITY)
Admission: RE | Admit: 2018-01-28 | Discharge: 2018-01-28 | Disposition: A | Discharge status: Home / Self Care | Payer: Medicare Other | Source: Ambulatory Visit | Attending: Cardiology | Admitting: Cardiology

## 2018-01-28 DIAGNOSIS — I10 Essential (primary) hypertension: Secondary | ICD-10-CM | POA: Diagnosis not present

## 2018-01-28 DIAGNOSIS — E7800 Pure hypercholesterolemia, unspecified: Secondary | ICD-10-CM | POA: Diagnosis not present

## 2018-01-28 DIAGNOSIS — I2102 ST elevation (STEMI) myocardial infarction involving left anterior descending coronary artery: Secondary | ICD-10-CM

## 2018-01-28 DIAGNOSIS — Z955 Presence of coronary angioplasty implant and graft: Secondary | ICD-10-CM

## 2018-01-28 DIAGNOSIS — I252 Old myocardial infarction: Secondary | ICD-10-CM | POA: Diagnosis not present

## 2018-01-28 DIAGNOSIS — Z87891 Personal history of nicotine dependence: Secondary | ICD-10-CM | POA: Diagnosis not present

## 2018-01-28 DIAGNOSIS — I2510 Atherosclerotic heart disease of native coronary artery without angina pectoris: Secondary | ICD-10-CM | POA: Diagnosis not present

## 2018-01-30 ENCOUNTER — Encounter (HOSPITAL_COMMUNITY)
Admission: RE | Admit: 2018-01-30 | Discharge: 2018-01-30 | Disposition: A | Discharge status: Home / Self Care | Payer: Medicare Other | Source: Ambulatory Visit | Attending: Cardiology | Admitting: Cardiology

## 2018-01-30 DIAGNOSIS — I10 Essential (primary) hypertension: Secondary | ICD-10-CM | POA: Diagnosis not present

## 2018-01-30 DIAGNOSIS — Z87891 Personal history of nicotine dependence: Secondary | ICD-10-CM | POA: Diagnosis not present

## 2018-01-30 DIAGNOSIS — I2102 ST elevation (STEMI) myocardial infarction involving left anterior descending coronary artery: Secondary | ICD-10-CM

## 2018-01-30 DIAGNOSIS — Z955 Presence of coronary angioplasty implant and graft: Secondary | ICD-10-CM

## 2018-01-30 DIAGNOSIS — I2510 Atherosclerotic heart disease of native coronary artery without angina pectoris: Secondary | ICD-10-CM | POA: Diagnosis not present

## 2018-01-30 DIAGNOSIS — I252 Old myocardial infarction: Secondary | ICD-10-CM | POA: Diagnosis not present

## 2018-01-30 DIAGNOSIS — E7800 Pure hypercholesterolemia, unspecified: Secondary | ICD-10-CM | POA: Diagnosis not present

## 2018-02-02 ENCOUNTER — Encounter (HOSPITAL_COMMUNITY)
Admission: RE | Admit: 2018-02-02 | Discharge: 2018-02-02 | Disposition: A | Discharge status: Home / Self Care | Payer: Medicare Other | Source: Ambulatory Visit | Attending: Cardiology | Admitting: Cardiology

## 2018-02-02 DIAGNOSIS — Z955 Presence of coronary angioplasty implant and graft: Secondary | ICD-10-CM

## 2018-02-02 DIAGNOSIS — I10 Essential (primary) hypertension: Secondary | ICD-10-CM | POA: Diagnosis not present

## 2018-02-02 DIAGNOSIS — E7800 Pure hypercholesterolemia, unspecified: Secondary | ICD-10-CM | POA: Diagnosis not present

## 2018-02-02 DIAGNOSIS — I2102 ST elevation (STEMI) myocardial infarction involving left anterior descending coronary artery: Secondary | ICD-10-CM

## 2018-02-02 DIAGNOSIS — Z87891 Personal history of nicotine dependence: Secondary | ICD-10-CM | POA: Diagnosis not present

## 2018-02-02 DIAGNOSIS — I252 Old myocardial infarction: Secondary | ICD-10-CM | POA: Diagnosis not present

## 2018-02-02 DIAGNOSIS — I2510 Atherosclerotic heart disease of native coronary artery without angina pectoris: Secondary | ICD-10-CM | POA: Diagnosis not present

## 2018-02-04 ENCOUNTER — Encounter (HOSPITAL_COMMUNITY)
Admission: RE | Admit: 2018-02-04 | Discharge: 2018-02-04 | Disposition: A | Discharge status: Home / Self Care | Payer: Medicare Other | Source: Ambulatory Visit | Attending: Cardiology | Admitting: Cardiology

## 2018-02-04 DIAGNOSIS — E7800 Pure hypercholesterolemia, unspecified: Secondary | ICD-10-CM | POA: Diagnosis not present

## 2018-02-04 DIAGNOSIS — Z955 Presence of coronary angioplasty implant and graft: Secondary | ICD-10-CM | POA: Diagnosis not present

## 2018-02-04 DIAGNOSIS — I2102 ST elevation (STEMI) myocardial infarction involving left anterior descending coronary artery: Secondary | ICD-10-CM

## 2018-02-04 DIAGNOSIS — I252 Old myocardial infarction: Secondary | ICD-10-CM | POA: Diagnosis not present

## 2018-02-04 DIAGNOSIS — I2510 Atherosclerotic heart disease of native coronary artery without angina pectoris: Secondary | ICD-10-CM | POA: Diagnosis not present

## 2018-02-04 DIAGNOSIS — I10 Essential (primary) hypertension: Secondary | ICD-10-CM | POA: Diagnosis not present

## 2018-02-04 DIAGNOSIS — Z87891 Personal history of nicotine dependence: Secondary | ICD-10-CM | POA: Diagnosis not present

## 2018-02-06 ENCOUNTER — Encounter (HOSPITAL_COMMUNITY)
Admission: RE | Admit: 2018-02-06 | Discharge: 2018-02-06 | Disposition: A | Discharge status: Home / Self Care | Payer: Medicare Other | Source: Ambulatory Visit | Attending: Cardiology | Admitting: Cardiology

## 2018-02-06 DIAGNOSIS — I2102 ST elevation (STEMI) myocardial infarction involving left anterior descending coronary artery: Secondary | ICD-10-CM

## 2018-02-06 DIAGNOSIS — E7800 Pure hypercholesterolemia, unspecified: Secondary | ICD-10-CM | POA: Diagnosis not present

## 2018-02-06 DIAGNOSIS — Z955 Presence of coronary angioplasty implant and graft: Secondary | ICD-10-CM

## 2018-02-06 DIAGNOSIS — I252 Old myocardial infarction: Secondary | ICD-10-CM | POA: Diagnosis not present

## 2018-02-06 DIAGNOSIS — Z87891 Personal history of nicotine dependence: Secondary | ICD-10-CM | POA: Diagnosis not present

## 2018-02-06 DIAGNOSIS — I10 Essential (primary) hypertension: Secondary | ICD-10-CM | POA: Diagnosis not present

## 2018-02-06 DIAGNOSIS — I2510 Atherosclerotic heart disease of native coronary artery without angina pectoris: Secondary | ICD-10-CM | POA: Diagnosis not present

## 2018-02-09 ENCOUNTER — Encounter (HOSPITAL_COMMUNITY): Payer: Medicare Other

## 2018-02-11 ENCOUNTER — Encounter (HOSPITAL_COMMUNITY)
Admission: RE | Admit: 2018-02-11 | Discharge: 2018-02-11 | Disposition: A | Discharge status: Home / Self Care | Payer: Medicare Other | Source: Ambulatory Visit | Attending: Cardiology | Admitting: Cardiology

## 2018-02-11 DIAGNOSIS — Z7902 Long term (current) use of antithrombotics/antiplatelets: Secondary | ICD-10-CM | POA: Diagnosis not present

## 2018-02-11 DIAGNOSIS — Z7982 Long term (current) use of aspirin: Secondary | ICD-10-CM | POA: Diagnosis not present

## 2018-02-11 DIAGNOSIS — Z955 Presence of coronary angioplasty implant and graft: Secondary | ICD-10-CM | POA: Diagnosis not present

## 2018-02-11 DIAGNOSIS — I252 Old myocardial infarction: Secondary | ICD-10-CM | POA: Diagnosis not present

## 2018-02-11 DIAGNOSIS — I2102 ST elevation (STEMI) myocardial infarction involving left anterior descending coronary artery: Secondary | ICD-10-CM

## 2018-02-11 DIAGNOSIS — Z87891 Personal history of nicotine dependence: Secondary | ICD-10-CM | POA: Insufficient documentation

## 2018-02-11 DIAGNOSIS — Z86010 Personal history of colonic polyps: Secondary | ICD-10-CM | POA: Diagnosis not present

## 2018-02-11 DIAGNOSIS — E7800 Pure hypercholesterolemia, unspecified: Secondary | ICD-10-CM | POA: Diagnosis not present

## 2018-02-11 DIAGNOSIS — I10 Essential (primary) hypertension: Secondary | ICD-10-CM | POA: Diagnosis not present

## 2018-02-11 DIAGNOSIS — I2510 Atherosclerotic heart disease of native coronary artery without angina pectoris: Secondary | ICD-10-CM | POA: Diagnosis not present

## 2018-02-11 DIAGNOSIS — Z79899 Other long term (current) drug therapy: Secondary | ICD-10-CM | POA: Diagnosis not present

## 2018-02-13 ENCOUNTER — Encounter (HOSPITAL_COMMUNITY)
Admission: RE | Admit: 2018-02-13 | Discharge: 2018-02-13 | Disposition: A | Discharge status: Home / Self Care | Payer: Medicare Other | Source: Ambulatory Visit | Attending: Cardiology | Admitting: Cardiology

## 2018-02-13 DIAGNOSIS — Z955 Presence of coronary angioplasty implant and graft: Secondary | ICD-10-CM

## 2018-02-13 DIAGNOSIS — E7800 Pure hypercholesterolemia, unspecified: Secondary | ICD-10-CM | POA: Diagnosis not present

## 2018-02-13 DIAGNOSIS — Z87891 Personal history of nicotine dependence: Secondary | ICD-10-CM | POA: Diagnosis not present

## 2018-02-13 DIAGNOSIS — I2510 Atherosclerotic heart disease of native coronary artery without angina pectoris: Secondary | ICD-10-CM | POA: Diagnosis not present

## 2018-02-13 DIAGNOSIS — I10 Essential (primary) hypertension: Secondary | ICD-10-CM | POA: Diagnosis not present

## 2018-02-13 DIAGNOSIS — I2102 ST elevation (STEMI) myocardial infarction involving left anterior descending coronary artery: Secondary | ICD-10-CM

## 2018-02-13 DIAGNOSIS — I252 Old myocardial infarction: Secondary | ICD-10-CM | POA: Diagnosis not present

## 2018-02-13 NOTE — Progress Notes (Signed)
Discharge Progress Report  Patient Details  Name: Travis Key [name]: [id number] Date of Birth: 03-10-1941 Referring Provider:     CARDIAC REHAB PHASE II ORIENTATION from 11/20/2017 in [place]  Referring Provider  Leonie Man MD       Number of Visits: 36  Reason for Discharge:  Patient independent in their exercise. Patient has met program and personal goals.  Smoking History:  Social History   Tobacco Use  Smoking Status Former Smoker  . Types: Cigarettes  . Last attempt to quit: 03/02/1985  . 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  ADL UCSD:   Initial Exercise Prescription: Initial Exercise Prescription - 11/20/17 1100      Date of Initial Exercise RX and Referring Provider   Date  11/20/17    Referring Provider  Leonie Man MD      Treadmill   MPH  2.5    Grade  0    Minutes  10    METs  2.91      Bike   Level  0.8    Minutes  10    METs  2.7      NuStep   Level  3    SPM  75    Minutes  10    METs  2.8      Prescription Details   Frequency (times per week)  3x    Duration  Progress to 30 minutes of continuous aerobic without signs/symptoms of physical distress      Intensity   THRR 40-80% of Max Heartrate  58-115    Ratings of Perceived Exertion  11-13    Perceived Dyspnea  0-4      Progression   Progression  Continue progressive overload as per policy without signs/symptoms or physical distress.      Resistance Training   Training Prescription  Yes    Weight  4lbs    Reps  10-15       Discharge Exercise Prescription (Final Exercise Prescription Changes): Exercise Prescription Changes - 03/11/18 0957      Response to Exercise   Blood Pressure (Admit)  118/72    Blood Pressure (Exercise)  148/72     Blood Pressure (Exit)  104/62    Heart Rate (Admit)  43 bpm    Heart Rate (Exercise)  95 bpm    Heart Rate (Exit)  52 bpm    Rating of Perceived Exertion (Exercise)  11    Symptoms  none    Duration  Continue with 30 min of aerobic exercise without signs/symptoms of physical distress.    Intensity  THRR unchanged      Progression   Progression  Continue to progress workloads to maintain intensity without signs/symptoms of physical distress.    Average METs  4.9      Resistance Training   Training Prescription  Yes    Weight  4lbs    Reps  10-15    Time  10 Minutes      Interval Training   Interval Training  No      Treadmill   MPH  3.5    Grade  2    Minutes  10    METs  4.65      Bike   Level  2.8  Minutes  10    METs  6.34      NuStep   Level  5    SPM  95    Minutes  10    METs  3.8      Home Exercise Plan   Plans to continue exercise at  Home (comment)    Frequency  Add 4 additional days to program exercise sessions.    Initial Home Exercises Provided  12/01/17       Functional Capacity: 6 Minute Walk    Row Name 11/20/17 1102 02/04/18 1010       6 Minute Walk   Phase  Initial  Discharge    Distance  1600 feet  1825 feet    Distance % Change  -  14.06 %    Walk Time  6 minutes  6 minutes    # of Rest Breaks  0  0    MPH  3  3.46    METS  2.8  3.11    RPE  11  11    Perceived Dyspnea   0  -    VO2 Peak  10.1  10.89    Symptoms  No  No    Resting HR  58 bpm  48 bpm    Resting BP  142/80  114/70    Resting Oxygen Saturation   96 %  -    Exercise Oxygen Saturation  during 6 min walk  99 %  -    Max Ex. HR  86 bpm  92 bpm    Max Ex. BP  160/80  126/82    2 Minute Post BP  148/80  104/60       Psychological, QOL, Others - Outcomes: PHQ 2/9: Depression screen [facility] 2/9 02/16/2018 11/24/2017  Decreased Interest 0 0  Down, Depressed, Hopeless 0 0  PHQ - 2 Score 0 0    Quality of Life: Quality of Life - 02/13/18 0925      Quality of Life  Scores   Health/Function Pre  24.85 %    Health/Function Post  26.23 %    Health/Function % Change  5.55 %    Socioeconomic Pre  26.43 %    Socioeconomic Post  26.43 %    Socioeconomic % Change   0 %    Psych/Spiritual Pre  24.71 %    Psych/Spiritual Post  24.29 %    Psych/Spiritual % Change  -1.7 %    Family Pre  26.4 %    Family Post  24.4 %    Family % Change  -7.58 %    GLOBAL Pre  25.37 %    GLOBAL Post  25.6 %    GLOBAL % Change  0.91 %       Personal Goals: Goals established at orientation with interventions provided to work toward goal. Personal Goals and Risk Factors at Admission - 11/20/17 1100      Core Components/Risk Factors/Patient Goals on Admission    Weight Management  Yes;Weight Loss    Intervention  Weight Management: Develop a combined nutrition and exercise program designed to reach desired caloric intake, while maintaining appropriate intake of nutrient and fiber, sodium and fats, and appropriate energy expenditure required for the weight goal.;Weight Management: Provide education and appropriate resources to help participant work on and attain dietary goals.;Weight Management/Obesity: Establish reasonable short term and long term weight goals.;Obesity: Provide education and appropriate resources to help participant work on and attain dietary goals.  Admit Weight  218 lb 11.2 oz (99.2 kg)    Goal Weight: Short Term  213 lb (96.6 kg)    Goal Weight: Long Term  208 lb (94.3 kg)    Expected Outcomes  Short Term: Continue to assess and modify interventions until short term weight is achieved;Long Term: Adherence to nutrition and physical activity/exercise program aimed toward attainment of established weight goal;Weight Loss: Understanding of general recommendations for a balanced deficit meal plan, which promotes 1-2 lb weight loss per week and includes a negative energy balance of [phone] kcal/d;Understanding recommendations for meals to include 15-35% energy as  protein, 25-35% energy from fat, 35-60% energy from carbohydrates, less than '200mg'$  of dietary cholesterol, 20-35 gm of total fiber daily;Understanding of distribution of calorie intake throughout the day with the consumption of 4-5 meals/snacks    Hypertension  Yes    Intervention  Provide education on lifestyle modifcations including regular physical activity/exercise, weight management, moderate sodium restriction and increased consumption of fresh fruit, vegetables, and low fat dairy, alcohol moderation, and smoking cessation.;Monitor prescription use compliance.    Expected Outcomes  Short Term: Continued assessment and intervention until BP is < 140/12m HG in hypertensive participants. < 130/851mHG in hypertensive participants with diabetes, heart failure or chronic kidney disease.;Long Term: Maintenance of blood pressure at goal levels.    Lipids  Yes    Intervention  Provide education and support for participant on nutrition & aerobic/resistive exercise along with prescribed medications to achieve LDL '70mg'$ , HDL >'40mg'$ .    Expected Outcomes  Short Term: Participant states understanding of desired cholesterol values and is compliant with medications prescribed. Participant is following exercise prescription and nutrition guidelines.;Long Term: Cholesterol controlled with medications as prescribed, with individualized exercise RX and with personalized nutrition plan. Value goals: LDL < '70mg'$ , HDL > 40 mg.        Personal Goals Discharge: Goals and Risk Factor Review    Row Name 12/02/17 1400 01/01/18 0931 01/27/18 1350 01/27/18 1356       Core Components/Risk Factors/Patient Goals Review   Personal Goals Review  Weight Management/Obesity;Lipids;Hypertension  Weight Management/Obesity;Lipids;Hypertension  Weight Management/Obesity;Lipids;Hypertension  -    Review  RoTramonds off to a good start to exercise. Some intermittent exertional BP elevations have been noted will contnue to monitor  RoOsamas  off to a good start to exercise. Some intermittent exertional BP elevations have been noted will contnue to monitor   Javeion's blood pressures have been improved since Dr harding increased his lisinopril. RoDaisyontinues to do well with exercise   Nashua's blood pressures have been improved since Dr haEllyn Hackncreased his amloddipine. RoAlfieontinues to do well with exercise    Expected Outcomes  RoStevensonill continue to particpate in phase 2 cardiac rehab and takes medications for HTN and Hyperlipidemia as prescribed  RoJamelill continue to particpate in phase 2 cardiac rehab and takes medications for HTN and Hyperlipidemia as prescribed  RoJesstinill continue to particpate in phase 2 cardiac rehab and takes medications for HTN and Hyperlipidemia as prescribed  -       Exercise Goals and Review: Exercise Goals    Row Name 11/20/17 1106             Exercise Goals   Increase Physical Activity  Yes       Intervention  Provide advice, education, support and counseling about physical activity/exercise needs.;Develop an individualized exercise prescription for aerobic and resistive training based on initial evaluation findings, risk stratification,  comorbidities and participant's personal goals.       Expected Outcomes  Short Term: Attend rehab on a regular basis to increase amount of physical activity.;Long Term: Add in home exercise to make exercise part of routine and to increase amount of physical activity.;Long Term: Exercising regularly at least 3-5 days a week.       Increase Strength and Stamina  Yes       Intervention  Provide advice, education, support and counseling about physical activity/exercise needs.;Develop an individualized exercise prescription for aerobic and resistive training based on initial evaluation findings, risk stratification, comorbidities and participant's personal goals.       Expected Outcomes  Short Term: Increase workloads from initial exercise prescription for resistance, speed, and  METs.;Short Term: Perform resistance training exercises routinely during rehab and add in resistance training at home;Long Term: Improve cardiorespiratory fitness, muscular endurance and strength as measured by increased METs and functional capacity (6MWT)       Able to understand and use rate of perceived exertion (RPE) scale  Yes       Intervention  Provide education and explanation on how to use RPE scale       Expected Outcomes  Short Term: Able to use RPE daily in rehab to express subjective intensity level;Long Term:  Able to use RPE to guide intensity level when exercising independently       Able to understand and use Dyspnea scale  Yes       Intervention  Provide education and explanation on how to use Dyspnea scale       Expected Outcomes  Short Term: Able to use Dyspnea scale daily in rehab to express subjective sense of shortness of breath during exertion;Long Term: Able to use Dyspnea scale to guide intensity level when exercising independently       Knowledge and understanding of Target Heart Rate Range (THRR)  Yes       Intervention  Provide education and explanation of THRR including how the numbers were predicted and where they are located for reference       Expected Outcomes  Short Term: Able to use daily as guideline for intensity in rehab;Short Term: Able to state/look up THRR;Long Term: Able to use THRR to govern intensity when exercising independently       Able to check pulse independently  Yes       Intervention  Review the importance of being able to check your own pulse for safety during independent exercise;Provide education and demonstration on how to check pulse in carotid and radial arteries.       Expected Outcomes  Short Term: Able to explain why pulse checking is important during independent exercise;Long Term: Able to check pulse independently and accurately       Understanding of Exercise Prescription  Yes       Intervention  Provide education, explanation, and  written materials on patient's individual exercise prescription       Expected Outcomes  Short Term: Able to explain program exercise prescription;Long Term: Able to explain home exercise prescription to exercise independently          Nutrition & Weight - Outcomes: Pre Biometrics - 11/20/17 1106      Pre Biometrics   Height  _0  (1.753 m)    Weight  219 lb 2.2 oz (99.4 kg)    Waist Circumference  44 inches    Hip Circumference  45 inches    Waist to Hip Ratio  0.98 %  BMI (Calculated)  32.35    Triceps Skinfold  32 mm    % Body Fat  34.5 %    Grip Strength  53 kg    Flexibility  9 in    Single Leg Stand  30 seconds      Post Biometrics - 02/16/18 0957       Post  Biometrics   Height  _0  (1.753 m)    Weight  218 lb 0.6 oz (98.9 kg)    Waist Circumference  41.25 inches    Hip Circumference  43 inches    Waist to Hip Ratio  0.96 %    BMI (Calculated)  32.18    Triceps Skinfold  19 mm    % Body Fat  30.9 %    Grip Strength  58.5 kg    Flexibility  11 in    Single Leg Stand  16.72 seconds       Nutrition: Nutrition Therapy & Goals - 11/20/17 1106      Nutrition Therapy   Diet  Heart Healthy      Personal Nutrition Goals   Nutrition Goal  Wt loss of 1-2 lb/week to a wt loss goal of 6-24 lb at graduation from Cardiac Rehab. Goal wt of 200 lb desired.       Intervention Plan   Intervention  Prescribe, educate and counsel regarding individualized specific dietary modifications aiming towards targeted core components such as weight, hypertension, lipid management, diabetes, heart failure and other comorbidities.    Expected Outcomes  Short Term Goal: Understand basic principles of dietary content, such as calories, fat, sodium, cholesterol and nutrients.;Long Term Goal: Adherence to prescribed nutrition plan.       Nutrition Discharge: Nutrition Assessments - 01/30/18 1111      MEDFICTS Scores   Pre Score  24    Post Score  56    Score Difference  32        Education Questionnaire Score: Knowledge Questionnaire Score - 02/13/18 0925      Knowledge Questionnaire Score   Pre Score  19/24    Post Score  21/24       Goals reviewed with patient; copy given to patient.Pt graduates from cardiac rehab program on [place] completion of 36 exercise sessions in Phase II. Pt maintained good attendance and progressed nicely during his participation in rehab as evidenced by increased MET level.   Medication list reconciled. Repeat  PHQ score-  .  Pt has made significant lifestyle changes and should be commended for his success. Pt feels he has achieved his goals during cardiac rehab.   Pt plans to continue exercise at home as he has home exercise equipment.Bunyan also plans to walk at home.We are proud of Stony's progress!Rayford increased his distance on his post exercise walk test.Maria Venetia Maxon, RN,BSN 03/12/2018 10:35 AM

## 2018-02-16 ENCOUNTER — Encounter (HOSPITAL_COMMUNITY)
Admission: RE | Admit: 2018-02-16 | Discharge: 2018-02-16 | Disposition: A | Discharge status: Home / Self Care | Payer: Medicare Other | Source: Ambulatory Visit | Attending: Cardiology | Admitting: Cardiology

## 2018-02-16 VITALS — BP 118/72 | HR 43 | Ht 69.0 in | Wt 218.0 lb

## 2018-02-16 DIAGNOSIS — I10 Essential (primary) hypertension: Secondary | ICD-10-CM | POA: Diagnosis not present

## 2018-02-16 DIAGNOSIS — I2102 ST elevation (STEMI) myocardial infarction involving left anterior descending coronary artery: Secondary | ICD-10-CM

## 2018-02-16 DIAGNOSIS — E7800 Pure hypercholesterolemia, unspecified: Secondary | ICD-10-CM | POA: Diagnosis not present

## 2018-02-16 DIAGNOSIS — Z87891 Personal history of nicotine dependence: Secondary | ICD-10-CM | POA: Diagnosis not present

## 2018-02-16 DIAGNOSIS — Z955 Presence of coronary angioplasty implant and graft: Secondary | ICD-10-CM | POA: Diagnosis not present

## 2018-02-16 DIAGNOSIS — I252 Old myocardial infarction: Secondary | ICD-10-CM | POA: Diagnosis not present

## 2018-02-16 DIAGNOSIS — I2510 Atherosclerotic heart disease of native coronary artery without angina pectoris: Secondary | ICD-10-CM | POA: Diagnosis not present

## 2018-03-26 DIAGNOSIS — Z0000 Encounter for general adult medical examination without abnormal findings: Secondary | ICD-10-CM | POA: Diagnosis not present

## 2018-03-26 DIAGNOSIS — I10 Essential (primary) hypertension: Secondary | ICD-10-CM | POA: Diagnosis not present

## 2018-03-26 DIAGNOSIS — I252 Old myocardial infarction: Secondary | ICD-10-CM | POA: Diagnosis not present

## 2018-03-26 DIAGNOSIS — E785 Hyperlipidemia, unspecified: Secondary | ICD-10-CM | POA: Diagnosis not present

## 2018-03-26 DIAGNOSIS — Z125 Encounter for screening for malignant neoplasm of prostate: Secondary | ICD-10-CM | POA: Diagnosis not present

## 2018-03-26 DIAGNOSIS — E559 Vitamin D deficiency, unspecified: Secondary | ICD-10-CM | POA: Diagnosis not present

## 2018-03-26 DIAGNOSIS — I2510 Atherosclerotic heart disease of native coronary artery without angina pectoris: Secondary | ICD-10-CM | POA: Diagnosis not present

## 2018-09-04 ENCOUNTER — Emergency Department (HOSPITAL_COMMUNITY): Payer: Medicare Other

## 2018-09-04 ENCOUNTER — Other Ambulatory Visit: Payer: Self-pay

## 2018-09-04 ENCOUNTER — Encounter (HOSPITAL_COMMUNITY): Payer: Self-pay | Admitting: Emergency Medicine

## 2018-09-04 ENCOUNTER — Observation Stay (HOSPITAL_COMMUNITY)
Admission: EM | Admit: 2018-09-04 | Discharge: 2018-09-05 | Disposition: A | Discharge status: Home / Self Care | Payer: Medicare Other | Attending: Cardiology | Admitting: Cardiology

## 2018-09-04 ENCOUNTER — Encounter (HOSPITAL_COMMUNITY)
Admission: EM | Disposition: A | Discharge status: Home / Self Care | Payer: Self-pay | Source: Home / Self Care | Attending: Emergency Medicine

## 2018-09-04 DIAGNOSIS — I2582 Chronic total occlusion of coronary artery: Secondary | ICD-10-CM | POA: Insufficient documentation

## 2018-09-04 DIAGNOSIS — E7800 Pure hypercholesterolemia, unspecified: Secondary | ICD-10-CM | POA: Insufficient documentation

## 2018-09-04 DIAGNOSIS — Z87891 Personal history of nicotine dependence: Secondary | ICD-10-CM | POA: Diagnosis not present

## 2018-09-04 DIAGNOSIS — I200 Unstable angina: Secondary | ICD-10-CM | POA: Diagnosis not present

## 2018-09-04 DIAGNOSIS — I252 Old myocardial infarction: Secondary | ICD-10-CM | POA: Diagnosis not present

## 2018-09-04 DIAGNOSIS — I480 Paroxysmal atrial fibrillation: Secondary | ICD-10-CM | POA: Diagnosis present

## 2018-09-04 DIAGNOSIS — I4892 Unspecified atrial flutter: Secondary | ICD-10-CM | POA: Insufficient documentation

## 2018-09-04 DIAGNOSIS — E785 Hyperlipidemia, unspecified: Secondary | ICD-10-CM | POA: Diagnosis present

## 2018-09-04 DIAGNOSIS — Z7982 Long term (current) use of aspirin: Secondary | ICD-10-CM | POA: Insufficient documentation

## 2018-09-04 DIAGNOSIS — Z7902 Long term (current) use of antithrombotics/antiplatelets: Secondary | ICD-10-CM | POA: Diagnosis not present

## 2018-09-04 DIAGNOSIS — E875 Hyperkalemia: Secondary | ICD-10-CM | POA: Diagnosis not present

## 2018-09-04 DIAGNOSIS — I10 Essential (primary) hypertension: Secondary | ICD-10-CM | POA: Diagnosis present

## 2018-09-04 DIAGNOSIS — Z955 Presence of coronary angioplasty implant and graft: Secondary | ICD-10-CM | POA: Diagnosis not present

## 2018-09-04 DIAGNOSIS — I25110 Atherosclerotic heart disease of native coronary artery with unstable angina pectoris: Principal | ICD-10-CM

## 2018-09-04 DIAGNOSIS — I2510 Atherosclerotic heart disease of native coronary artery without angina pectoris: Secondary | ICD-10-CM | POA: Diagnosis present

## 2018-09-04 DIAGNOSIS — R079 Chest pain, unspecified: Secondary | ICD-10-CM | POA: Diagnosis not present

## 2018-09-04 DIAGNOSIS — Z9861 Coronary angioplasty status: Secondary | ICD-10-CM

## 2018-09-04 DIAGNOSIS — I483 Typical atrial flutter: Secondary | ICD-10-CM | POA: Diagnosis not present

## 2018-09-04 HISTORY — PX: LEFT HEART CATH AND CORONARY ANGIOGRAPHY: CATH118249

## 2018-09-04 HISTORY — PX: CORONARY STENT INTERVENTION: CATH118234

## 2018-09-04 LAB — I-STAT TROPONIN, ED: TROPONIN I, POC: 0 ng/mL (ref 0.00–0.08)

## 2018-09-04 LAB — BASIC METABOLIC PANEL
Anion gap: 8 (ref 5–15)
BUN: 15 mg/dL (ref 8–23)
CO2: 22 mmol/L (ref 22–32)
CREATININE: 1.21 mg/dL (ref 0.61–1.24)
Calcium: 9.3 mg/dL (ref 8.9–10.3)
Chloride: 106 mmol/L (ref 98–111)
GFR calc Af Amer: >60 mL/min (ref >60)
GFR calc non Af Amer: 56 mL/min — ABNORMAL LOW (ref >60)
GLUCOSE: 111 mg/dL — AB (ref 70–99)
Potassium: 6 mmol/L — ABNORMAL HIGH (ref 3.5–5.1)
SODIUM: 136 mmol/L (ref 135–145)

## 2018-09-04 LAB — POCT ACTIVATED CLOTTING TIME
ACTIVATED CLOTTING TIME: 285 s
ACTIVATED CLOTTING TIME: 527 s

## 2018-09-04 LAB — CBC
HCT: 48 % (ref 39.0–52.0)
Hemoglobin: 15.6 g/dL (ref 13.0–17.0)
MCH: 30.2 pg (ref 26.0–34.0)
MCHC: 32.5 g/dL (ref 30.0–36.0)
MCV: 93 fL (ref 80.0–100.0)
Platelets: 236 10*3/uL (ref 150–400)
RBC: 5.16 MIL/uL (ref 4.22–5.81)
RDW: 12.5 % (ref 11.5–15.5)
WBC: 8.2 10*3/uL (ref 4.0–10.5)
nRBC: 0 % (ref 0.0–0.2)

## 2018-09-04 LAB — I-STAT CHEM 8, ED
BUN: 15 mg/dL (ref 8–23)
Calcium, Ion: 1.1 mmol/L — ABNORMAL LOW (ref 1.15–1.40)
Chloride: 106 mmol/L (ref 98–111)
Creatinine, Ser: 1.1 mg/dL (ref 0.61–1.24)
Glucose, Bld: 100 mg/dL — ABNORMAL HIGH (ref 70–99)
HCT: 46 % (ref 39.0–52.0)
Hemoglobin: 15.6 g/dL (ref 13.0–17.0)
Potassium: 3.9 mmol/L (ref 3.5–5.1)
Sodium: 141 mmol/L (ref 135–145)
TCO2: 27 mmol/L (ref 22–32)

## 2018-09-04 LAB — TROPONIN I: Troponin I: <0.03 ng/mL (ref <0.03)

## 2018-09-04 SURGERY — LEFT HEART CATH AND CORONARY ANGIOGRAPHY
Anesthesia: LOCAL

## 2018-09-04 MED ORDER — NITROGLYCERIN 0.4 MG SL SUBL: 0.4000 mg | SUBLINGUAL_TABLET | SUBLINGUAL | Status: DC | PRN

## 2018-09-04 MED ORDER — HEPARIN SODIUM (PORCINE) 1000 UNIT/ML IJ SOLN
INTRAMUSCULAR | Status: DC | PRN
  Administered 2018-09-04 (×2): 5000 [IU] via INTRAVENOUS

## 2018-09-04 MED ORDER — VERAPAMIL HCL 2.5 MG/ML IV SOLN
INTRAVENOUS | Status: DC | PRN
  Administered 2018-09-04: 10 mL via INTRA_ARTERIAL

## 2018-09-04 MED ORDER — SODIUM CHLORIDE 0.9% FLUSH: 3.0000 mL | INTRAVENOUS | Status: DC | PRN

## 2018-09-04 MED ORDER — CLOPIDOGREL BISULFATE 300 MG PO TABS
ORAL_TABLET | ORAL | Status: AC
  Filled 2018-09-04: qty 1

## 2018-09-04 MED ORDER — SODIUM CHLORIDE 0.9% FLUSH
3.0000 mL | Freq: Two times a day (BID) | INTRAVENOUS | Status: DC
  Administered 2018-09-05: 3 mL via INTRAVENOUS

## 2018-09-04 MED ORDER — NITROGLYCERIN 2 % TD OINT: 1.0000 [in_us] | TOPICAL_OINTMENT | Freq: Once | TRANSDERMAL | Status: DC

## 2018-09-04 MED ORDER — LIDOCAINE HCL (PF) 1 % IJ SOLN
INTRAMUSCULAR | Status: AC
  Filled 2018-09-04: qty 30

## 2018-09-04 MED ORDER — CLOPIDOGREL BISULFATE 300 MG PO TABS
ORAL_TABLET | ORAL | Status: DC | PRN
  Administered 2018-09-04: 300 mg via ORAL

## 2018-09-04 MED ORDER — MIDAZOLAM HCL 2 MG/2ML IJ SOLN
INTRAMUSCULAR | Status: DC | PRN
  Administered 2018-09-04: 1 mg via INTRAVENOUS

## 2018-09-04 MED ORDER — ACETAMINOPHEN 325 MG PO TABS: 650.0000 mg | ORAL_TABLET | ORAL | Status: DC | PRN

## 2018-09-04 MED ORDER — ONDANSETRON HCL 4 MG/2ML IJ SOLN: 4.0000 mg | Freq: Four times a day (QID) | INTRAMUSCULAR | Status: DC | PRN

## 2018-09-04 MED ORDER — FENTANYL CITRATE (PF) 100 MCG/2ML IJ SOLN
INTRAMUSCULAR | Status: DC | PRN
  Administered 2018-09-04: 25 ug via INTRAVENOUS

## 2018-09-04 MED ORDER — SODIUM CHLORIDE 0.9 % WEIGHT BASED INFUSION
1.0000 mL/kg/h | INTRAVENOUS | Status: AC
  Administered 2018-09-04: 1 mL/kg/h via INTRAVENOUS

## 2018-09-04 MED ORDER — NITROGLYCERIN 0.4 MG SL SUBL
0.4000 mg | SUBLINGUAL_TABLET | SUBLINGUAL | Status: DC | PRN
  Administered 2018-09-04: 0.4 mg via SUBLINGUAL
  Filled 2018-09-04: qty 1

## 2018-09-04 MED ORDER — ROSUVASTATIN CALCIUM 40 MG PO TABS
40.0000 mg | ORAL_TABLET | Freq: Every day | ORAL | Status: DC
  Administered 2018-09-04: 40 mg via ORAL
  Filled 2018-09-04: qty 1

## 2018-09-04 MED ORDER — FENTANYL CITRATE (PF) 100 MCG/2ML IJ SOLN
INTRAMUSCULAR | Status: AC
  Filled 2018-09-04: qty 2

## 2018-09-04 MED ORDER — CLOPIDOGREL BISULFATE 75 MG PO TABS
75.0000 mg | ORAL_TABLET | Freq: Every day | ORAL | Status: DC
  Administered 2018-09-05: 75 mg via ORAL
  Filled 2018-09-04: qty 1

## 2018-09-04 MED ORDER — LIDOCAINE HCL (PF) 1 % IJ SOLN
INTRAMUSCULAR | Status: DC | PRN
  Administered 2018-09-04: 2 mL

## 2018-09-04 MED ORDER — SODIUM CHLORIDE 0.9 % IV SOLN: 250.0000 mL | INTRAVENOUS | Status: DC | PRN

## 2018-09-04 MED ORDER — SODIUM CHLORIDE 0.9% FLUSH: 3.0000 mL | Freq: Two times a day (BID) | INTRAVENOUS | Status: DC

## 2018-09-04 MED ORDER — MIDAZOLAM HCL 2 MG/2ML IJ SOLN
INTRAMUSCULAR | Status: AC
  Filled 2018-09-04: qty 2

## 2018-09-04 MED ORDER — ASPIRIN 81 MG PO CHEW
162.0000 mg | CHEWABLE_TABLET | Freq: Once | ORAL | Status: AC
  Administered 2018-09-04: 162 mg via ORAL
  Filled 2018-09-04: qty 2

## 2018-09-04 MED ORDER — HEPARIN (PORCINE) IN NACL 1000-0.9 UT/500ML-% IV SOLN
INTRAVENOUS | Status: AC
  Filled 2018-09-04: qty 500

## 2018-09-04 MED ORDER — ASPIRIN 81 MG PO CHEW
81.0000 mg | CHEWABLE_TABLET | Freq: Every day | ORAL | Status: DC
  Administered 2018-09-05: 09:00:00 81 mg via ORAL
  Filled 2018-09-04: qty 1

## 2018-09-04 MED ORDER — CARVEDILOL 3.125 MG PO TABS
3.1250 mg | ORAL_TABLET | Freq: Two times a day (BID) | ORAL | Status: DC
  Administered 2018-09-04 – 2018-09-05 (×2): 3.125 mg via ORAL
  Filled 2018-09-04 (×2): qty 1

## 2018-09-04 MED ORDER — AMLODIPINE BESYLATE 10 MG PO TABS
10.0000 mg | ORAL_TABLET | Freq: Every day | ORAL | Status: DC
  Administered 2018-09-05: 09:00:00 10 mg via ORAL
  Filled 2018-09-04: qty 1

## 2018-09-04 MED ORDER — IOHEXOL 350 MG/ML SOLN
INTRAVENOUS | Status: DC | PRN
  Administered 2018-09-04: 130 mL via INTRA_ARTERIAL

## 2018-09-04 MED ORDER — VERAPAMIL HCL 2.5 MG/ML IV SOLN
INTRAVENOUS | Status: AC
  Filled 2018-09-04: qty 2

## 2018-09-04 MED ORDER — SODIUM CHLORIDE 0.9 % WEIGHT BASED INFUSION: 1.0000 mL/kg/h | INTRAVENOUS | Status: DC

## 2018-09-04 MED ORDER — ANGIOPLASTY BOOK
Freq: Once | Status: AC
  Administered 2018-09-04: 22:00:00
  Filled 2018-09-04: qty 1

## 2018-09-04 MED ORDER — SODIUM CHLORIDE 0.9 % WEIGHT BASED INFUSION: 3.0000 mL/kg/h | INTRAVENOUS | Status: DC

## 2018-09-04 MED ORDER — HEPARIN (PORCINE) IN NACL 1000-0.9 UT/500ML-% IV SOLN
INTRAVENOUS | Status: DC | PRN
  Administered 2018-09-04 (×2): 500 mL

## 2018-09-04 MED ORDER — HEPARIN SODIUM (PORCINE) 1000 UNIT/ML IJ SOLN
INTRAMUSCULAR | Status: AC
  Filled 2018-09-04: qty 1

## 2018-09-04 MED ORDER — SODIUM CHLORIDE 0.9 % IV SOLN
INTRAVENOUS | Status: AC | PRN
  Administered 2018-09-04: 20 mL/h via INTRAVENOUS

## 2018-09-04 MED ORDER — ASPIRIN 81 MG PO CHEW: 81.0000 mg | CHEWABLE_TABLET | ORAL | Status: DC

## 2018-09-04 MED ORDER — NAPHAZOLINE-GLYCERIN 0.012-0.2 % OP SOLN
1.0000 [drp] | Freq: Four times a day (QID) | OPHTHALMIC | Status: DC | PRN
  Filled 2018-09-04: qty 15

## 2018-09-04 SURGICAL SUPPLY — 16 items
BALLN SAPPHIRE 2.5X12 (BALLOONS) ×2
BALLN SAPPHIRE ~~LOC~~ 3.75X12 (BALLOONS) ×1 IMPLANT
BALLOON SAPPHIRE 2.5X12 (BALLOONS) IMPLANT
CATH 5FR JL3.5 JR4 ANG PIG MP (CATHETERS) ×1 IMPLANT
CATH LAUNCHER 6FR EBU 4 (CATHETERS) ×1 IMPLANT
DEVICE RAD COMP TR BAND LRG (VASCULAR PRODUCTS) ×1 IMPLANT
GLIDESHEATH SLEND SS 6F .021 (SHEATH) ×1 IMPLANT
GUIDEWIRE INQWIRE 1.5J.035X260 (WIRE) IMPLANT
INQWIRE 1.5J .035X260CM (WIRE) ×2
KIT ENCORE 26 ADVANTAGE (KITS) ×1 IMPLANT
KIT HEART LEFT (KITS) ×2 IMPLANT
PACK CARDIAC CATHETERIZATION (CUSTOM PROCEDURE TRAY) ×2 IMPLANT
STENT SIERRA 3.50 X 15 MM (Permanent Stent) ×1 IMPLANT
TRANSDUCER W/STOPCOCK (MISCELLANEOUS) ×2 IMPLANT
TUBING CIL FLEX 10 FLL-RA (TUBING) ×2 IMPLANT
WIRE ASAHI PROWATER 180CM (WIRE) ×2 IMPLANT

## 2018-09-04 NOTE — ED Triage Notes (Signed)
Pt here with c/o CP that started last PM.  Pt had stent placements in December 2018.

## 2018-09-04 NOTE — Interval H&P Note (Signed)
History and Physical Interval Note:  09/04/2018 4:22 PM  Travis Key  has presented today for surgery, with the diagnosis of chest pain  The various methods of treatment have been discussed with the patient and family. After consideration of risks, benefits and other options for treatment, the patient has consented to  Procedure(s): LEFT HEART CATH AND CORONARY ANGIOGRAPHY (N/A) as a surgical intervention .  The patient's history has been reviewed, patient examined, no change in status, stable for surgery.  I have reviewed the patient's chart and labs.  Questions were answered to the patient's satisfaction.   Cath Lab Visit (complete for each Cath Lab visit)  Clinical Evaluation Leading to the Procedure:   ACS: Yes.    Non-ACS:    Anginal Classification: CCS IV  Anti-ischemic medical therapy: Maximal Therapy (2 or more classes of medications)  Non-Invasive Test Results: No non-invasive testing performed  Prior CABG: No previous CABG        Travis Key [facility] 09/04/2018 4:22 PM

## 2018-09-04 NOTE — ED Provider Notes (Signed)
[facility] EMERGENCY DEPARTMENT Provider Note   CSN: [id number] Arrival date & time: 09/04/18  1050     History   Chief Complaint Chief Complaint  Patient presents with  . Chest Pain    HPI Emmanuell Kantz is a 77 y.o. male.  Pt presents to the ED today with CP.  The pt said that it feels similar to how it felt last December when he had a MI.  The pt had 2 stents placed in his LAD.  He had an occluded nondominant RCA and moderate disease in his circumflex.  He has felt well until today.  He last saw cardiology several months ago.  Pt does take Brilinta and baby asa daily.      Past Medical History:  Diagnosis Date  . Adenomatous polyp of colon 08/2003  . Anal fissure   . CAD S/P LAD PCI for Antero-Posterior STEMI    12/18 STEMI PCI/DESx1 mLAD, CTO of RCA, normal EF  . Diverticulosis   . High cholesterol   . History of myocardial infarction: Anterior - posterior MI (LAD lesion - PCI) 09/14/2017  . Hypertension   . Internal hemorrhoids     Patient Active Problem List   Diagnosis Date Noted  . 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 Hypertension 09/16/2017  . Hyperlipidemia with target low density lipoprotein (LDL) cholesterol less than 70 mg/dL 09/16/2017  . History of myocardial infarction: Anterior - posterior MI (LAD lesion - PCI) 09/14/2017  . CAD S/P percutaneous coronary angioplasty 09/14/2017    Past Surgical History:  Procedure Laterality Date  . CORONARY STENT INTERVENTION N/A 09/14/2017   Procedure: CORONARY STENT INTERVENTION;  Surgeon: Leonie Man, MD;  Location: [facility];  Service: Cardiovascular:: p-mLAD 95% (tandem lesions 80&95%) --> PCI with 2 overlapping Xience [place] DES (distal 4.0 x 38 -> prox 4.0 x 12 --> post-dilated to ~4.6 mm.], 0% residual  . CORONARY/GRAFT ACUTE MI REVASCULARIZATION N/A 09/14/2017   Procedure: Coronary/Graft Acute MI Revascularization;  Surgeon: Leonie Man, MD;  Location: [facility];  Service:  Cardiovascular;  Laterality: N/A;  . LEFT HEART CATH AND CORONARY ANGIOGRAPHY N/A 09/14/2017   Procedure: LEFT HEART CATH AND CORONARY ANGIOGRAPHY;  Surgeon: Leonie Man, MD;  Location: [facility];  p-m LAD 85-95% tandem lesions (PCI), 0% residual.  OstD2 ~50%, m-dCx 60%, LPL1 ~50%. Small Non-dom RCA 100% CTO ~mid.  EF ~45-50% with apical anterior, apical & inferoapical HK.  Marland Kitchen ROTATOR CUFF REPAIR Right   . TRANSTHORACIC ECHOCARDIOGRAM  09/14/2017   In setting of anterior STEMI: EF 50 and 55%.  Moderate LVH.  Moderate HK of mid-apical anteroseptal wall.  Mild aortic valve calcification.  Marland Kitchen UMBILICAL HERNIA REPAIR          Home Medications    Prior to Admission medications   Medication Sig Start Date End Date Taking? Authorizing Provider  amLODipine (NORVASC) 10 MG tablet Take 1 tablet (10 mg total) by mouth daily. 01/15/18 09/04/18 Yes Leonie Man, MD  aspirin 81 MG chewable tablet Chew 1 tablet (81 mg total) by mouth daily. 09/17/17  Yes Reino Bellis B, NP  irbesartan (AVAPRO) 300 MG tablet Take 300 mg by mouth daily.   Yes [provider]  nitroGLYCERIN (NITROSTAT) 0.4 MG SL tablet Place 1 tablet (0.4 mg total) under the tongue every 5 (five) minutes as needed. 09/16/17  Yes Reino Bellis B, NP  rosuvastatin (CRESTOR) 40 MG tablet Take 0.5 tablets (20 mg total)  by mouth at bedtime. Patient taking differently: Take 40 mg by mouth at bedtime.  09/16/17  Yes Reino Bellis B, NP  tetrahydrozoline 0.05 % ophthalmic solution Place 1 drop into both eyes daily.   Yes [provider]  ticagrelor (BRILINTA) 90 MG TABS tablet Take 1 tablet (90 mg total) by mouth 2 (two) times daily. 09/16/17  Yes Reino Bellis B, NP  carvedilol (COREG) 6.25 MG tablet Take 0.5 tablets (3.125 mg total) by mouth 2 (two) times daily. Patient not taking: Reported on 09/04/2018 01/15/18   Leonie Man, MD    Family History Family History  Problem Relation Age of Onset  .  Diabetes Mother   . Liver disease Mother     Social History Social History   Tobacco Use  . Smoking status: Former Smoker    Types: Cigarettes    Last attempt to quit: 03/02/1985    Years since quitting: 33.5  . Smokeless tobacco: Never Used  Substance Use Topics  . Alcohol use: No    Alcohol/week: 0.0 standard drinks  . Drug use: No     Allergies   Patient has no known allergies.   Review of Systems Review of Systems  Cardiovascular: Positive for chest pain.  All other systems reviewed and are negative.    Physical Exam Updated Vital Signs BP 121/81   Pulse 62   Temp (!) 97.5 F (36.4 C) (Oral)   Resp 13   Ht 5' 11.5" (1.816 m)   Wt 93 kg   SpO2 98%   BMI 28.19 kg/m   Physical Exam  Constitutional: He is oriented to person, place, and time. He appears well-developed and well-nourished.  HENT:  Head: Normocephalic and atraumatic.  Eyes: Pupils are equal, round, and reactive to light. EOM are normal.  Neck: Normal range of motion. Neck supple.  Cardiovascular: Normal rate, regular rhythm, intact distal pulses and normal pulses.  Pulmonary/Chest: Effort normal and breath sounds normal.  Abdominal: Soft. Bowel sounds are normal.  Musculoskeletal: Normal range of motion.       Right lower leg: Normal.       Left lower leg: Normal.  Neurological: He is alert and oriented to person, place, and time.  Skin: Skin is warm and dry. Capillary refill takes less than 2 seconds.  Psychiatric: He has a normal mood and affect. His behavior is normal.  Nursing note and vitals reviewed.    ED Treatments / Results  Labs (all labs ordered are listed, but only abnormal results are displayed) Labs Reviewed  BASIC METABOLIC PANEL - Abnormal; Notable for the following components:      Result Value   Potassium 6.0 (*)    Glucose, Bld 111 (*)    GFR calc non Af Amer 56 (*)    All other components within normal limits  CBC  I-STAT TROPONIN, ED    EKG EKG  Interpretation  Date/Time:  Friday September 04 2018 11:53:54 EST Ventricular Rate:  62 PR Interval:    QRS Duration: 104 QT Interval:  428 QTC Calculation: 435 R Axis:   1 Text Interpretation:  Atrial flutter with predominant 4:1 AV block Multiple ventricular premature complexes Minimal ST elevation, inferior leads better quality EKG.  New onset flutter Confirmed by Isla Pence [phone]) on 09/04/2018 12:00:38 PM   Radiology Dg Chest 2 View  Result Date: 09/04/2018 CLINICAL DATA:  Chest pain history. EXAM: CHEST - 2 VIEW COMPARISON:  09/14/2017. FINDINGS: Mediastinum hilar structures normal. Heart size normal. Low  lung volumes with mild basilar subsegmental atelectasis. Stable elevation left hemidiaphragm. No pleural effusion or pneumothorax. Degenerative change thoracic spine IMPRESSION: Low lung volumes with stable bibasilar atelectasis. Stable elevation left hemidiaphragm. Electronically Signed   By: Marcello Moores  Register   On: 09/04/2018 11:53    Procedures Procedures (including critical care time)  Medications Ordered in ED Medications  nitroGLYCERIN (NITROSTAT) SL tablet 0.4 mg (0.4 mg Sublingual Given 09/04/18 1155)  nitroGLYCERIN (NITROGLYN) 2 % ointment 1 inch (has no administration in time range)  aspirin chewable tablet 162 mg (162 mg Oral Given 09/04/18 1155)     Initial Impression / Assessment and Plan / ED Course  I have reviewed the triage vital signs and the nursing notes.  Pertinent labs & imaging results that were available during my care of the patient were reviewed by me and considered in my medical decision making (see chart for details).     Pt's CP is gone after nitro.  The pt was also given additional ASA.  EKG shows new atrial flutter.  Pt d/w cardiology who will see pt in ED.  Cardiology will admit for obs r/o.  Final Clinical Impressions(s) / ED Diagnoses   Final diagnoses:  Unstable angina [facility])  Atrial flutter, unspecified type [facility])    ED  Discharge Orders    None       Isla Pence, MD 09/04/18 1517

## 2018-09-04 NOTE — ED Notes (Signed)
Cardiology at bedside.

## 2018-09-04 NOTE — H&P (Addendum)
Cardiology Admission History and Physical:   Patient ID: Caetano Oberhaus [name]: [id number]; DOB: Jul 23, 1941   Admission date: 09/04/2018  Primary Care Provider: [name], FNP Primary Cardiologist: Glenetta Hew, MD  Chief Complaint:  Chest pain   Patient Profile:   Kahiau Schewe is a 77 y.o. male with hx of CAD/STEMI -September 14, 2017 --> p-m LAD PCI with 2 overlapping DES stents, hypertension and hyperlipidemia presents for evaluation of chest pain.   Transthoracic Echo 09/14/2017: EF 50 and 55%.  Moderate LVH.  Moderate HK of mid-apical anteroseptal wall.  Mild aortic valve calcification.  Cardiac Cath: p-mLAD 95% (tandem lesions 80&95%) --> PCI with 2 overlapping Xience [place] DES (distal 4.0 x 38 -> prox 4.0 x 12 --> post-dilated to ~4.6 mm.], 0% residual.  OstD2 ~50%, m-dCx 60%, LPL1 ~50%. Small Non-dom RCA 100% CTO ~mid.  EF ~45-50% with apical anterior, apical & inferoapical HK.  He was doing well on cardiac standpoint when last seen by Dr. Ellyn Hack March 2019.  History of Present Illness:   Mr. Cuevas presented for evaluation of chest pain.  His symptoms started yesterday while lifting heavy jack (he was working on car).  No associated shortness of breath, radiation, diaphoresis, nausea or vomiting.  Was severe discomfort which was similar to his STEMI last year.  His pain is of but never resolved.  His pain is exacerbated while laying on her bed last night.  Due to ongoing symptoms he came to ER for further evaluation.  He describes his chest discomfort at burning sensation which resolved after sublingual nitroglycerin x1 in ER.  He did not require any sublingual nitroglycerin since his last PCI.  In ER, EKG showed new onset atrial flutter at controlled ventricular rate.  Point-of-care troponin negative.  Potassium 6.  Creatinine 1.21.  Chest x-ray nonconclusive.  Patient denies any exertional chest discomfort prior to yesterday.  He walks 3 miles every day and does yard work without  any limitation.  He denies palpitation, dizziness, shortness of breath, orthopnea, PND, syncope, lower extremity edema or melena.  Compliant with medication.   Past Medical History:  Diagnosis Date  . Adenomatous polyp of colon 08/2003  . Anal fissure   . CAD S/P LAD PCI for Antero-Posterior STEMI    12/18 STEMI PCI/DESx1 mLAD, CTO of RCA, normal EF  . Diverticulosis   . High cholesterol   . History of myocardial infarction: Anterior - posterior MI (LAD lesion - PCI) 09/14/2017  . Hypertension   . Internal hemorrhoids     Past Surgical History:  Procedure Laterality Date  . CORONARY STENT INTERVENTION N/A 09/14/2017   Procedure: CORONARY STENT INTERVENTION;  Surgeon: Leonie Man, MD;  Location: [facility];  Service: Cardiovascular:: p-mLAD 95% (tandem lesions 80&95%) --> PCI with 2 overlapping Xience [place] DES (distal 4.0 x 38 -> prox 4.0 x 12 --> post-dilated to ~4.6 mm.], 0% residual  . CORONARY/GRAFT ACUTE MI REVASCULARIZATION N/A 09/14/2017   Procedure: Coronary/Graft Acute MI Revascularization;  Surgeon: Leonie Man, MD;  Location: [facility];  Service: Cardiovascular;  Laterality: N/A;  . LEFT HEART CATH AND CORONARY ANGIOGRAPHY N/A 09/14/2017   Procedure: LEFT HEART CATH AND CORONARY ANGIOGRAPHY;  Surgeon: Leonie Man, MD;  Location: [facility];  p-m LAD 85-95% tandem lesions (PCI), 0% residual.  OstD2 ~50%, m-dCx 60%, LPL1 ~50%. Small Non-dom RCA 100% CTO ~mid.  EF ~45-50% with apical anterior, apical & inferoapical HK.  Marland Kitchen ROTATOR CUFF REPAIR Right   .  TRANSTHORACIC ECHOCARDIOGRAM  09/14/2017   In setting of anterior STEMI: EF 50 and 55%.  Moderate LVH.  Moderate HK of mid-apical anteroseptal wall.  Mild aortic valve calcification.  Marland Kitchen UMBILICAL HERNIA REPAIR       Medications Prior to Admission: Prior to Admission medications   Medication Sig Start Date End Date Taking? Authorizing Provider  amLODipine (NORVASC) 10 MG tablet Take 1 tablet  (10 mg total) by mouth daily. 01/15/18 09/04/18 Yes Leonie Man, MD  aspirin 81 MG chewable tablet Chew 1 tablet (81 mg total) by mouth daily. 09/17/17  Yes Reino Bellis B, NP  irbesartan (AVAPRO) 300 MG tablet Take 300 mg by mouth daily.   Yes [provider]  nitroGLYCERIN (NITROSTAT) 0.4 MG SL tablet Place 1 tablet (0.4 mg total) under the tongue every 5 (five) minutes as needed. 09/16/17  Yes Reino Bellis B, NP  rosuvastatin (CRESTOR) 40 MG tablet Take 0.5 tablets (20 mg total) by mouth at bedtime. Patient taking differently: Take 40 mg by mouth at bedtime.  09/16/17  Yes Reino Bellis B, NP  tetrahydrozoline 0.05 % ophthalmic solution Place 1 drop into both eyes daily.   Yes [provider]  ticagrelor (BRILINTA) 90 MG TABS tablet Take 1 tablet (90 mg total) by mouth 2 (two) times daily. 09/16/17  Yes Reino Bellis B, NP  carvedilol (COREG) 6.25 MG tablet Take 0.5 tablets (3.125 mg total) by mouth 2 (two) times daily. Patient not taking: Reported on 09/04/2018 01/15/18   Leonie Man, MD     Allergies:   No Known Allergies  Social History:   Social History   Tobacco Use  . Smoking status: Former Smoker    Types: Cigarettes    Last attempt to quit: 03/02/1985    Years since quitting: 33.5  . Smokeless tobacco: Never Used  Substance Use Topics  . Alcohol use: No    Alcohol/week: 0.0 standard drinks  . Drug use: No   Social History   Social History Narrative  . Not on file     Family History:   The patient's family history includes Diabetes in his mother; Liver disease in his mother.    ROS:  Please see the history of present illness.  All other ROS reviewed and negative.     Physical Exam/Data:   Vitals:   09/04/18 1245 09/04/18 1300 09/04/18 1315 09/04/18 1330  BP: 118/72 117/81 124/80 130/78  Pulse: 62 60 61 61  Resp: 17 10 16 18   Temp:      TempSrc:      SpO2: 96% 96% 95% 97%  Weight:      Height:       No intake or output  data in the 24 hours ending 09/04/18 1458 Filed Weights   09/04/18 1126  Weight: 93 kg   Body mass index is 28.19 kg/m.  General:  Well nourished, well developed, in no acute distress HEENT: normal Lymph: no adenopathy Neck: no JVD Endocrine:  No thryomegaly Vascular: No carotid bruits; FA pulses 2+ bilaterally without bruits  Cardiac:  normal S1, S2; RRR; no murmur Lungs:  clear to auscultation bilaterally, no wheezing, rhonchi or rales  Abd: soft, nontender, no hepatomegaly  Ext: no  edema Musculoskeletal:  No deformities, BUE and BLE strength normal and equal Skin: warm and dry  Neuro:  CNs 2-12 intact, no focal abnormalities noted Psych:  Normal affect   Relevant CV Studies:  Echo 09/2017 Study Conclusions  - Left ventricle: The cavity  size was normal. Wall thickness was   increased in a pattern of moderate LVH. Systolic function was   normal. The estimated ejection fraction was in the range of 50%   to 55%. Moderate hypokinesis of the mid-apicalanteroseptal   myocardium. - Aortic valve: Trileaflet; normal thickness, mildly calcified   leaflets. - Left atrium: The atrium was moderately dilated.  Coronary/Graft Acute MI Revascularization   09/14/17  CORONARY STENT INTERVENTION  LEFT HEART CATH AND CORONARY ANGIOGRAPHY  Conclusion     Prox LAD to Mid LAD lesion is 95% stenosed. (Tandem 80-95% stenosis)  A drug-eluting stent was successfully placed using a STENT SIERRA 4.00 X 38 MM. --Postdilated to 4.6 mm  A second overlapping drug-eluting stent was successfully placed using a STENT [place] 4.00 X 12 MM. --Postdilated to 4.6 mm  Post intervention, there is a 0% residual stenosis.  _______________________________________________  Colon Flattery 2nd Diag lesion is 50% stenosed.  Prox RCA to Mid RCA lesion is 100% stenosed. CTO of nondominant RCA with bridging collaterals  Mid Cx to Dist Cx lesion is 60% stenosed. 1st LPL lesion is 50% stenosed.  There is mild to  moderate left ventricular systolic dysfunction. The left ventricular ejection fraction is 45-50% by visual estimate. With apical anterior, apical and inferoapical hypo-a kinesis  LV end diastolic pressure is moderately elevated.   Severe two-vessel disease with almost subtotally occluded proximal mid wraparound LAD as a likely culprit lesion along with a likely CTO of the still large caliber nondominant right coronary artery.  Moderate disease of the dominant circumflex. Tandem 80 and 95% lesions in the proximal to mid LAD were treated with 2 overlapping DES stents as noted. The subtle EKG changes noted leading to diagnosis of acute posterior infarct is likely related to this being a wraparound LAD that was potentially intermittently occlusive.    Plan:  Admit to CCU for monitoring and TR band removal.  Dual antiplatelet therapy for minimum 1 year.  Aggressive risk factor modification: We will increase Crestor dose to 40 mg, convert from HCTZ to carvedilol and continue ARB  Check fasting lipid level and A1c  We will order 2D echocardiogram for baseline.  Depending on how he does clinically, consider fast-track discharge   Diagnostic Diagram       Post-Intervention Diagram        Laboratory Data:  Chemistry Recent Labs  Lab 09/04/18 1105  NA 136  K 6.0*  CL 106  CO2 22  GLUCOSE 111*  BUN 15  CREATININE 1.21  CALCIUM 9.3  GFRNONAA 56*  GFRAA >60  ANIONGAP 8    Hematology Recent Labs  Lab 09/04/18 1105  WBC 8.2  RBC 5.16  HGB 15.6  HCT 48.0  MCV 93.0  MCH 30.2  MCHC 32.5  RDW 12.5  PLT 236   Recent Labs  Lab 09/04/18 1148  TROPIPOC 0.00    ` Radiology/Studies:  Dg Chest 2 View  Result Date: 09/04/2018 CLINICAL DATA:  Chest pain history. EXAM: CHEST - 2 VIEW COMPARISON:  09/14/2017. FINDINGS: Mediastinum hilar structures normal. Heart size normal. Low lung volumes with mild basilar subsegmental atelectasis. Stable elevation left hemidiaphragm. No  pleural effusion or pneumothorax. Degenerative change thoracic spine IMPRESSION: Low lung volumes with stable bibasilar atelectasis. Stable elevation left hemidiaphragm. Electronically Signed   By: Marcello Moores  Register   On: 09/04/2018 11:53    Assessment and Plan:   1. Anginal pain with history of CAD s/p  2 overlapping Xience [place] DES to p-m LAD 09/2017 -  Symptoms started after lifting heavy object yesterday.  Now almost resolved with sublingual nitroglycerin x1.  His symptoms is similar to his prior angina.  EKG without ischemic changes.  Point-of-care troponin negative.  2.  New onset atrial flutter -At controlled ventricular rate on Coreg 6.25 mg twice daily.  Patient denies palpitation, dizziness, syncope or heart failure symptoms.  This patients CHA2DS2-VASc Score and unadjusted Ischemic Stroke Rate (% per year) is equal to 4.8 % stroke rate/year from a score of 4  Above score calculated as 1 point each if present [CHF, HTN, DM, Vascular=MI/PAD/Aortic Plaque, Age if 65-74, or Male] Above score calculated as 2 points each if present [Age > 75, or Stroke/TIA/TE]  Will plan cath today. Start NOAC based on findings.   3. HTN - BP stable. Resume home medication  4. HLD - 09/15/2017: VLDL 18 12/01/2017: Cholesterol, Total 121; HDL 45; LDL Calculated 61; Triglycerides 74  - Continue statin    Severity of Illness: The appropriate patient status for this patient is OBSERVATION. Observation status is judged to be reasonable and necessary in order to provide the required intensity of service to ensure the patient's safety. The patient's presenting symptoms, physical exam findings, and initial radiographic and laboratory data in the context of their medical condition is felt to place them at decreased risk for further clinical deterioration. Furthermore, it is anticipated that the patient will be medically stable for discharge from the hospital within 2 midnights of admission. The following  factors support the patient status of observation.   " The patient's presenting symptoms include Chest pain . " The physical exam findings include None " The initial radiographic and laboratory data are atrial flutter      Signed, Leanor Kail, PA  09/04/2018 2:58 PM   ATTENDING ATTESTATION I have seen, examined and evaluated the patient this pm along with Mr. Curly [place], [place].  After reviewing all the available data and chart, we discussed the patients laboratory, study & physical findings as well as symptoms in detail. I agree with his findings, examination as well as impression recommendations as per our discussion.    Interesting scenario of, new onset chest discomfort that was similar to his previous anginal equivalent.  This did occur after exertion and was relieved with nitroglycerin here in emergency room.  This is the first time he is actually had to use nitroglycerin since his PCI.  He denies any irregular heartbeats or palpitations although in the ER he was noted to be in atrial flutter with controlled rate.  Leading diagnosis is unstable angina/ACS versus symptomatic atrial flutter in the setting of existing CAD (has known occlusion of the RCA with collaterals).  Plan: Diagnostic catheterization with possible PCI Start anticoagulation post cath --> would consider DOAC, which would mean going from Brilinta to Plavix. Rate is currently controlled on carvedilol, if asymptomatic, could place on anticoagulation and return in roughly 4 weeks for cardioversion, however if he is symptomatic with no significant CAD change, would need to consider cardioversion prior to discharge.  Lab values were resulted while receiving him and his potassium was elevated at 6.  We will recheck an i-STAT potassium and address accordingly.  Will hold ARB for now.  If it is indeed elevated, will treat with precath hydration and IV Lasix plus or minus lactulose. Would also been DC ARB and potentially titrate  up carvedilol.  Further plans will be pending based on results of cardiac catheterization.  Regardless, he will at least be here overnight to  reevaluate post cath.    Glenetta Hew, M.D., M.S. Interventional Cardiologist   Pager # [phone] Phone # [phone] [address] [place], [place] [address]     For questions or updates, please contact [facility] Please consult [email or web address] for contact info under

## 2018-09-05 ENCOUNTER — Observation Stay (HOSPITAL_BASED_OUTPATIENT_CLINIC_OR_DEPARTMENT_OTHER): Payer: Medicare Other

## 2018-09-05 DIAGNOSIS — I10 Essential (primary) hypertension: Secondary | ICD-10-CM | POA: Diagnosis not present

## 2018-09-05 DIAGNOSIS — R9431 Abnormal electrocardiogram [ECG] [EKG]: Secondary | ICD-10-CM | POA: Diagnosis not present

## 2018-09-05 DIAGNOSIS — Z7982 Long term (current) use of aspirin: Secondary | ICD-10-CM | POA: Diagnosis not present

## 2018-09-05 DIAGNOSIS — I4892 Unspecified atrial flutter: Secondary | ICD-10-CM | POA: Diagnosis not present

## 2018-09-05 DIAGNOSIS — I25110 Atherosclerotic heart disease of native coronary artery with unstable angina pectoris: Secondary | ICD-10-CM | POA: Diagnosis not present

## 2018-09-05 DIAGNOSIS — E785 Hyperlipidemia, unspecified: Secondary | ICD-10-CM | POA: Diagnosis not present

## 2018-09-05 DIAGNOSIS — E7800 Pure hypercholesterolemia, unspecified: Secondary | ICD-10-CM | POA: Diagnosis not present

## 2018-09-05 DIAGNOSIS — I2582 Chronic total occlusion of coronary artery: Secondary | ICD-10-CM | POA: Diagnosis not present

## 2018-09-05 DIAGNOSIS — Z7902 Long term (current) use of antithrombotics/antiplatelets: Secondary | ICD-10-CM | POA: Diagnosis not present

## 2018-09-05 DIAGNOSIS — Z87891 Personal history of nicotine dependence: Secondary | ICD-10-CM | POA: Diagnosis not present

## 2018-09-05 DIAGNOSIS — I200 Unstable angina: Secondary | ICD-10-CM | POA: Diagnosis not present

## 2018-09-05 DIAGNOSIS — Z955 Presence of coronary angioplasty implant and graft: Secondary | ICD-10-CM | POA: Diagnosis not present

## 2018-09-05 DIAGNOSIS — I252 Old myocardial infarction: Secondary | ICD-10-CM | POA: Diagnosis not present

## 2018-09-05 LAB — BASIC METABOLIC PANEL
ANION GAP: 8 (ref 5–15)
BUN: 15 mg/dL (ref 8–23)
CALCIUM: 8.7 mg/dL — AB (ref 8.9–10.3)
CO2: 21 mmol/L — ABNORMAL LOW (ref 22–32)
Chloride: 107 mmol/L (ref 98–111)
Creatinine, Ser: 1.27 mg/dL — ABNORMAL HIGH (ref 0.61–1.24)
GFR calc Af Amer: >60 mL/min (ref >60)
GFR, EST NON AFRICAN AMERICAN: 53 mL/min — AB (ref >60)
Glucose, Bld: 98 mg/dL (ref 70–99)
Potassium: 3.9 mmol/L (ref 3.5–5.1)
SODIUM: 136 mmol/L (ref 135–145)

## 2018-09-05 LAB — ECHOCARDIOGRAM COMPLETE
Height: 71.5 in
WEIGHTICAEL: 3350.4 [oz_av]

## 2018-09-05 LAB — CBC
HCT: 44 % (ref 39.0–52.0)
Hemoglobin: 14.4 g/dL (ref 13.0–17.0)
MCH: 30.3 pg (ref 26.0–34.0)
MCHC: 32.7 g/dL (ref 30.0–36.0)
MCV: 92.6 fL (ref 80.0–100.0)
PLATELETS: 209 10*3/uL (ref 150–400)
RBC: 4.75 MIL/uL (ref 4.22–5.81)
RDW: 12.3 % (ref 11.5–15.5)
WBC: 9.4 10*3/uL (ref 4.0–10.5)
nRBC: 0 % (ref 0.0–0.2)

## 2018-09-05 LAB — TROPONIN I: TROPONIN I: 0.03 ng/mL — AB (ref <0.03)

## 2018-09-05 MED ORDER — APIXABAN 5 MG PO TABS: 5.0000 mg | ORAL_TABLET | Freq: Two times a day (BID) | ORAL | 0 refills | Status: DC

## 2018-09-05 MED ORDER — APIXABAN 5 MG PO TABS: 5.0000 mg | ORAL_TABLET | Freq: Two times a day (BID) | ORAL | 3 refills | Status: DC

## 2018-09-05 MED ORDER — ASPIRIN 81 MG PO CHEW: 81.0000 mg | CHEWABLE_TABLET | Freq: Every day | ORAL | Status: DC

## 2018-09-05 MED ORDER — CLOPIDOGREL BISULFATE 75 MG PO TABS: 75.0000 mg | ORAL_TABLET | Freq: Every day | ORAL | 3 refills | Status: DC

## 2018-09-05 MED ORDER — CARVEDILOL 6.25 MG PO TABS: 6.2500 mg | ORAL_TABLET | Freq: Two times a day (BID) | ORAL | 3 refills | Status: DC

## 2018-09-05 NOTE — Progress Notes (Signed)
TR BAND REMOVAL  LOCATION:    right radial  DEFLATED PER PROTOCOL:    Yes.    TIME BAND OFF / DRESSING APPLIED:    2135   SITE UPON ARRIVAL:    Level 0  SITE AFTER BAND REMOVAL:    Level 0  CIRCULATION SENSATION AND MOVEMENT:    Within Normal Limits   Yes.    COMMENTS:   Post TR Band instructions given, sterile dressing applied, good cap. refill

## 2018-09-05 NOTE — Discharge Summary (Addendum)
Discharge Summary    [name],  [name]: [id number], DOB/AGE: Nov 02, 1940 77 y.o.  Admit date: 09/04/2018 Discharge date: 09/05/2018  Primary Care Provider: Suella Key Primary Cardiologist: Travis Hew, MD  Discharge Diagnoses    Principal Problem:   Unstable angina [facility]) Active Problems:   Essential hypertension   Hyperlipidemia with target low density lipoprotein (LDL) cholesterol less than 70 mg/dL   CAD S/P percutaneous coronary angioplasty   New onset atrial flutter (Travis Key)   Coronary artery disease, occluded RCA with bridging collaterals and left-to-right collaterals   Allergies No Known Allergies  Diagnostic Studies/Procedures    Left heart catheterization 09/04/18:  Travis Key 2nd Diag lesion is 50% stenosed.  Mid Cx to Dist Cx lesion is 90% stenosed.  Prox RCA to Mid RCA lesion is 100% stenosed.  Previously placed Prox LAD to Mid LAD stent (unknown type) is widely patent.  Post intervention, there is a 0% residual stenosis.  A drug-eluting stent was successfully placed using a STENT SIERRA 3.50 X 15 MM.  LV end diastolic pressure is normal.   1. 2 vessel obstructive CAD.    - continued patency of prior LAD stents    - 90% mid LCx- new from prior and the culprit lesion    - 100% CTO of a nondominant RCA 2. Normal LVEDP 3. Successful PCI of the LCx with DES x 1.  Plan: Echo to assess LV function. DAPT. If no bleeding can start anticoagulation tomorrow.   Recommend to resume Apixaban, at currently prescribed dose and frequency, on 09/05/18.  Recommend concurrent antiplatelet therapy of Aspirin 81mg  daily for 1 month and Clopidogrel 75mg  daily for 12 months.  Echocardiogram 09/05/18: Pending at discharge _____________   History of Present Illness     Travis Key presented for evaluation of chest pain.  His symptoms started yesterday while lifting heavy jack (he was working on car).  No associated shortness of breath, radiation,  diaphoresis, nausea or vomiting.  Was severe discomfort which was similar to his STEMI last year.  His pain is of but never resolved.  His pain is exacerbated while laying on her bed last night.  Due to ongoing symptoms he came to ER for further evaluation.  He describes his chest discomfort at burning sensation which resolved after sublingual nitroglycerin x1 in ER.  He did not require any sublingual nitroglycerin since his last PCI.  In ER, EKG showed new onset atrial flutter at controlled ventricular rate.  Point-of-care troponin negative.  Potassium 6.  Creatinine 1.21.  Chest x-ray nonconclusive.  Patient denies any exertional chest discomfort prior to yesterday.  He walks 3 miles every day and does yard work without any limitation.  He denies palpitation, dizziness, shortness of breath, orthopnea, PND, syncope, lower extremity edema or melena.  Compliant with medication.  Hospital Course     Consultants: None   1. Unstable angina in patient with CAD s/p overlapping p-mLAD stenting 09/2017: patient presented with exertional chest pain improved with SL nitro. Symptoms were similar to prior angina. EKG with new onset atrial flutter with CVR. Troponin trend negative. Patient underwent a LHC 09/04/18 to further evaluate coronary anatomy and was found to have 2 vessel obstructive CAD with patent LAD stents, CTO of RCA, and new 90% stenosis of mLCx felt to be the culprit lesion - managed with PCI/DES. He was recommended to start plavix given need for anticoagulation for atrial flutter. Plan for aspirin x1 month and plavix x12 months. Cr 1.27  on the day of discharge (baseline 1.1) - Continue aspirin x1 month and plavix for a minimum of 12 months.  - Continue carvedilol and statin - Check BMET at next follow-up visit  2. New onset atrial flutter: EKG on admission with new onset atrial flutter with CVR. No complaints of palpitations dizziness, syncope, or volume overload. Echo pending at discharge. He  was started on apixaban 5mg  BID for CHA2DS2-VASc Score of 4 (HTN, vascular, and age >60).  - Continue apixaban 5mg  BID for stroke ppx - Continue carvedilol for rate control - Will arrange outpatient follow-up for DCCV consideration after 3 weeks of uninterrupted anticoagulation  3. HTN: BP stable - Home medications continued at discharge  4. HLD: LDL 61 11/2017 - Continue statin   _____________  Discharge Vitals Blood pressure 126/63, pulse 65, temperature 98.4 F (36.9 C), temperature source Oral, resp. rate 18, height 5' 11.5" (1.816 m), weight 95 kg, SpO2 95 %.  Filed Weights   09/04/18 1126 09/05/18 0638  Weight: 93 kg 95 kg    GEN: Sitting on the edge of the hospital bed in no acute distress.   Neck: No JVD, no carotid bruits Cardiac:  IRIR, no murmurs, rubs, or gallops. Right radial cath site C/D/I, no hematoma or ecchymosis Respiratory: Clear to auscultation bilaterally, no wheezes/ rales/ rhonchi GI: NABS, Soft, nontender, non-distended  MS: No edema; No deformity. Neuro:  Nonfocal, moving all extremities spontaneously Psych: Normal affect      Labs & Radiologic Studies    CBC Recent Labs    09/04/18 1105 09/04/18 1603 09/05/18 0534  WBC 8.2  --  9.4  HGB 15.6 15.6 14.4  HCT 48.0 46.0 44.0  MCV 93.0  --  92.6  PLT 236  --  382   Basic Metabolic Panel Recent Labs    09/04/18 1105 09/04/18 1603 09/05/18 0534  NA 136 141 136  K 6.0* 3.9 3.9  CL 106 106 107  CO2 22  --  21*  GLUCOSE 111* 100* 98  BUN 15 15 15   CREATININE 1.21 1.10 1.27*  CALCIUM 9.3  --  8.7*   Liver Function Tests No results for input(s): AST, ALT, ALKPHOS, BILITOT, PROT, ALBUMIN in the last 72 hours. No results for input(s): LIPASE, AMYLASE in the last 72 hours. Cardiac Enzymes Recent Labs    09/04/18 1747 09/05/18 0025 09/05/18 0534  TROPONINI <0.03 <0.03 0.03*   BNP Invalid input(s): POCBNP D-Dimer No results for input(s): DDIMER in the last 72 hours. Hemoglobin  A1C No results for input(s): HGBA1C in the last 72 hours. Fasting Lipid Panel No results for input(s): CHOL, HDL, LDLCALC, TRIG, CHOLHDL, LDLDIRECT in the last 72 hours. Thyroid Function Tests No results for input(s): TSH, T4TOTAL, T3FREE, THYROIDAB in the last 72 hours.  Invalid input(s): FREET3 _____________  Dg Chest 2 View  Result Date: 09/04/2018 CLINICAL DATA:  Chest pain history. EXAM: CHEST - 2 VIEW COMPARISON:  09/14/2017. FINDINGS: Mediastinum hilar structures normal. Heart size normal. Low lung volumes with mild basilar subsegmental atelectasis. Stable elevation left hemidiaphragm. No pleural effusion or pneumothorax. Degenerative change thoracic spine IMPRESSION: Low lung volumes with stable bibasilar atelectasis. Stable elevation left hemidiaphragm. Electronically Signed   By: Marcello Moores  Register   On: 09/04/2018 11:53   Disposition   Patient was seen and examined by Dr. Lovena Le who deemed patient as stable for discharge. Follow-up has been arranged. Discharge medications as listed below.   Follow-up Plans & Appointments    Follow-up Information  [facility] Follow up.   Specialty:  Cardiology Why:  Dr. Allison Quarry office will contact you directly with an appointment date/time to be seen within the next 2 weeks for post-hospital follow-up. Please contact the office if you do not hear from them by 09/09/18.  Contact information: [address] [place] [phone]         Discharge Instructions    AMB Referral to Cardiac Rehabilitation - Phase II   Complete by:  As directed    Diagnosis:  Coronary Stents      Discharge Medications   Allergies as of 09/05/2018   No Known Allergies     Medication List    STOP taking these medications   ticagrelor 90 MG Tabs tablet Commonly known as:  BRILINTA     TAKE these medications   amLODipine 10 MG tablet Commonly known as:  NORVASC Take 1 tablet (10 mg total) by  mouth daily.   apixaban 5 MG Tabs tablet Commonly known as:  ELIQUIS Take 1 tablet (5 mg total) by mouth 2 (two) times daily.   apixaban 5 MG Tabs tablet Commonly known as:  ELIQUIS Take 1 tablet (5 mg total) by mouth 2 (two) times daily.   aspirin 81 MG chewable tablet Chew 1 tablet (81 mg total) by mouth daily. YOU SHOULD STOP TAKING THIS MEDICATION ON 10/05/18 What changed:  additional instructions   carvedilol 6.25 MG tablet Commonly known as:  COREG Take 1 tablet (6.25 mg total) by mouth 2 (two) times daily. What changed:  how much to take   clopidogrel 75 MG tablet Commonly known as:  PLAVIX Take 1 tablet (75 mg total) by mouth daily with breakfast.   irbesartan 300 MG tablet Commonly known as:  AVAPRO Take 300 mg by mouth daily.   nitroGLYCERIN 0.4 MG SL tablet Commonly known as:  NITROSTAT Place 1 tablet (0.4 mg total) under the tongue every 5 (five) minutes as needed.   rosuvastatin 40 MG tablet Commonly known as:  CRESTOR Take 0.5 tablets (20 mg total) by mouth at bedtime. What changed:  how much to take   tetrahydrozoline 0.05 % ophthalmic solution Place 1 drop into both eyes daily.        Aspirin prescribed at discharge?  Yes High Intensity Statin Prescribed? (Lipitor 40-80mg  or Crestor 20-40mg ): Yes Beta Blocker Prescribed? Yes For EF <40%, was ACEI/ARB Prescribed? Yes ADP Receptor Inhibitor Prescribed? (i.e. Plavix etc.-Includes Medically Managed Patients): Yes For EF <40%, Aldosterone Inhibitor Prescribed? No: EF >40% Was EF assessed during THIS hospitalization? Yes Was Cardiac Rehab II ordered? (Included Medically managed Patients): Yes   Outstanding Labs/Studies   BMET at next follow-up visit  Duration of Discharge Encounter   Greater than 30 minutes including physician time.  Signed, Abigail Butts PA-C 09/05/2018, 9:19 AM  Cardiology attending No chest pain or sob after PCI. Wrist with minimal tenderness. No swelling on exam. He is  s/p PCI of Lcx. He has atrial flutter. He will be discharged on plavix, ASA for one month and Eliquis. I will see him in 5-6 weeks to discuss flutter ablation. He was unaware that he had atrial flutter. His rates are controlled.   Mikle Bosworth.D.

## 2018-09-05 NOTE — Progress Notes (Signed)
Patient's heart rate is dropping to 30s non sustaining, pt is asymptomatic. Will continue to monitor pt.

## 2018-09-05 NOTE — Progress Notes (Signed)
CARDIAC REHAB PHASE I   PRE:  Rate/Rhythm: 64 aflutter, PVCs  BP:  Supine:   Sitting: 139/95  Standing:    SaO2:   MODE:  Ambulation: 1000 ft   POST:  Rate/Rhythm: 121 aflutter  83 with rest  BP:  Supine:   Sitting:   Standing: 167/79   SaO2: 97%RA 0850-0930 Pt walked 1000 ft with steady gait and no CP. Reviewed importance of plavix with stent. Gave OFF the BEAT booklet since aflutter is new. Reviewed NTG use, ex ed and gave heart healthy diet. Pt just completed CRP 2 in MAY and attended classes including diet. Pt walks 3 miles daily and doubts will repeat CRP 2. Referred to [place].   Graylon Good, RN BSN  09/05/2018 9:25 AM

## 2018-09-05 NOTE — Discharge Instructions (Signed)
PLEASE REMEMBER TO BRING ALL OF YOUR MEDICATIONS TO EACH OF YOUR FOLLOW-UP OFFICE VISITS.  PLEASE ATTEND ALL SCHEDULED FOLLOW-UP APPOINTMENTS.   Activity: Increase activity slowly as tolerated. You may shower, but no soaking baths (or swimming) for 1 week. No driving for 24 hours. No lifting over 5 lbs for 1 week. No sexual activity for 1 week.   You May Return to Work: in 1 week (if applicable)  Wound Care: You may wash cath site gently with soap and water. Keep cath site clean and dry. If you notice pain, swelling, bleeding or pus at your cath site, please call [phone].   Please take aspirin 81mg  daily for the next month and then stop this medication (end 10/05/18). You will take plavix (clopidogrel) 75mg  daily for at least one year to help keep your heart stent open and prevent blockages from forming.   You will also need to take a blood thinner apixaban (Eliquis) 5mg  two times per day to prevent strokes given your new diagnosis of atrial fibrillation.   Given the need for three blood thinning agents, it is important that you monitor for bleeding closely (stool, urine, injuries, etc.). If you fall and hit your head, it is important that you present to the emergency room to be evaluated as you could have bleeding in your brain.

## 2018-09-05 NOTE — Progress Notes (Signed)
  Echocardiogram 2D Echocardiogram has been performed.  Travis Key 09/05/2018, 8:44 AM

## 2018-09-07 ENCOUNTER — Encounter (HOSPITAL_COMMUNITY): Payer: Self-pay | Admitting: Cardiology

## 2018-09-07 ENCOUNTER — Telehealth: Payer: Self-pay | Admitting: Physician Assistant

## 2018-09-07 NOTE — Telephone Encounter (Signed)
TOC Patient-  Please call Patient-  Pt has an appointment with Rosaria Ferries on 09-15-18.

## 2018-09-08 ENCOUNTER — Telehealth (HOSPITAL_COMMUNITY): Payer: Self-pay

## 2018-09-08 NOTE — Telephone Encounter (Signed)
Pt returned CR phone call and stated he was not interested at this time.  Closed referral

## 2018-09-08 NOTE — Telephone Encounter (Signed)
Patient contacted regarding discharge from [facility] on 09/05/18.    Patient understands to follow up with provider Rosaria Ferries PA on 09/15/18 at 9 AM at [facility].  Patient understands discharge instructions? yes  Patient understands medications and regiment? yes  Patient understands to bring all medications to this visit? yes

## 2018-09-08 NOTE — Telephone Encounter (Signed)
Attempted to call patient in regards to Cardiac Rehab - LM on VM 

## 2018-09-14 NOTE — H&P (View-Only) (Signed)
Cardiology Office Note   Date:  09/15/2018   ID:  Travis Key, DOB 27-Nov-1940, [name] [id number]  PCP:  [name], FNP Cardiologist:  Glenetta Hew, MD 09/04/2018 Rosaria Ferries, PA-C   No chief complaint on file.   History of Present Illness: Travis Key is a 77 y.o. male with a history of STEMI 09/2017 s/p overlapping DES LAD, HTN, HLD, colon polyps   Admitted 11/22-11/23/2019 for USAP, s/p cath w/ DES CFX, med rx for CTO RCA, EF 55-60%, ASA x 1 month, Plavix x 12 mo. He was in atrial flutter on admit, started on apixaban 5mg  BID for CHA2DS2-VASc Score of 4 (HTN, vascular, and age >48). Consider DCCV after 3 weeks Eliquis, ck BMET at f/u  Travis Key presents for cardiology follow up.   He had some fatigue after d/c, but that has gotten better. However, his activity level is significantly lower than usual.  He was walking 2 miles a day, has not started back at all. Is aware that temp should be 45-85 degrees if he walks outside.   He does not want to do cardiac rehab, has done it before. Does not feel it will help him. Is willing to exercise regularly on his own.   No orthopnea or PND, no LE except a little daytime edema.   He never feels the atrial flutter, does not get light-headed or dizzy. However, he is concerned that his fatigue is from the atrial flutter.  Someone had mentioned cardioversion after several weeks of anticoagulation and he is interested in pursuing this.  No bleeding issues on the Eliquis.  He is sure that he has not missed any doses.   Past Medical History:  Diagnosis Date  . Adenomatous polyp of colon 08/2003  . Anal fissure   . CAD S/P LAD PCI for Antero-Posterior STEMI    12/18 STEMI PCI/DESx1 mLAD, CTO of RCA, normal EF  . Diverticulosis   . High cholesterol   . History of myocardial infarction: Anterior - posterior MI (LAD lesion - PCI) 09/14/2017  . Hypertension   . Internal hemorrhoids     Past Surgical History:  Procedure Laterality  Date  . CORONARY STENT INTERVENTION N/A 09/14/2017   Procedure: CORONARY STENT INTERVENTION;  Surgeon: Leonie Man, MD;  Location: [facility];  Service: Cardiovascular:: p-mLAD 95% (tandem lesions 80&95%) --> PCI with 2 overlapping Xience [place] DES (distal 4.0 x 38 -> prox 4.0 x 12 --> post-dilated to ~4.6 mm.], 0% residual  . CORONARY STENT INTERVENTION N/A 09/04/2018   Procedure: CORONARY STENT INTERVENTION;  Surgeon: Martinique, Peter M, MD;  Location: [facility];  Service: Cardiovascular;  Laterality: N/A;  . CORONARY/GRAFT ACUTE MI REVASCULARIZATION N/A 09/14/2017   Procedure: Coronary/Graft Acute MI Revascularization;  Surgeon: Leonie Man, MD;  Location: [facility];  Service: Cardiovascular;  Laterality: N/A;  . LEFT HEART CATH AND CORONARY ANGIOGRAPHY N/A 09/14/2017   Procedure: LEFT HEART CATH AND CORONARY ANGIOGRAPHY;  Surgeon: Leonie Man, MD;  Location: [facility];  p-m LAD 85-95% tandem lesions (PCI), 0% residual.  OstD2 ~50%, m-dCx 60%, LPL1 ~50%. Small Non-dom RCA 100% CTO ~mid.  EF ~45-50% with apical anterior, apical & inferoapical HK.  Marland Kitchen LEFT HEART CATH AND CORONARY ANGIOGRAPHY N/A 09/04/2018   Procedure: LEFT HEART CATH AND CORONARY ANGIOGRAPHY;  Surgeon: Martinique, Peter M, MD;  Location: [facility];  Service: Cardiovascular;  Laterality: N/A;  . ROTATOR CUFF REPAIR Right   .  TRANSTHORACIC ECHOCARDIOGRAM  09/14/2017   In setting of anterior STEMI: EF 50 and 55%.  Moderate LVH.  Moderate HK of mid-apical anteroseptal wall.  Mild aortic valve calcification.  Marland Kitchen UMBILICAL HERNIA REPAIR      Current Outpatient Medications  Medication Sig Dispense Refill  . amLODipine (NORVASC) 10 MG tablet Take 1 tablet (10 mg total) by mouth daily. 90 tablet 3  . apixaban (ELIQUIS) 5 MG TABS tablet Take 1 tablet (5 mg total) by mouth 2 (two) times daily. 60 tablet 0  . apixaban (ELIQUIS) 5 MG TABS tablet Take 1 tablet (5 mg total) by mouth 2 (two)  times daily. 180 tablet 3  . aspirin 81 MG chewable tablet Chew 1 tablet (81 mg total) by mouth daily. YOU SHOULD STOP TAKING THIS MEDICATION ON 10/05/18    . carvedilol (COREG) 6.25 MG tablet Take 1 tablet (6.25 mg total) by mouth 2 (two) times daily. 180 tablet 3  . clopidogrel (PLAVIX) 75 MG tablet Take 1 tablet (75 mg total) by mouth daily with breakfast. 90 tablet 3  . irbesartan (AVAPRO) 300 MG tablet Take 300 mg by mouth daily.    . nitroGLYCERIN (NITROSTAT) 0.4 MG SL tablet Place 1 tablet (0.4 mg total) under the tongue every 5 (five) minutes as needed. 25 tablet 2  . rosuvastatin (CRESTOR) 40 MG tablet Take 0.5 tablets (20 mg total) by mouth at bedtime. (Patient taking differently: Take 40 mg by mouth at bedtime. ) 90 tablet 1  . tetrahydrozoline 0.05 % ophthalmic solution Place 1 drop into both eyes daily.     No current facility-administered medications for this visit.     Allergies:   Patient has no known allergies.    Social History:  The patient  reports that he quit smoking about 33 years ago. His smoking use included cigarettes. He has never used smokeless tobacco. He reports that he does not drink alcohol or use drugs.   Family History:  The patient's family history includes Diabetes in his mother; Liver disease in his mother.  He indicated that the status of his mother is unknown.    ROS:  Please see the history of present illness. All other systems are reviewed and negative.    PHYSICAL EXAM: VS:  BP 130/72   Pulse 72   Ht 5\' 11"  (1.803 m)   Wt 218 lb (98.9 kg)   SpO2 99%   BMI 30.40 kg/m  , BMI Body mass index is 30.4 kg/m. GEN: Well nourished, well developed, male in no acute distress HEENT: normal for age  Neck: no JVD, no carotid bruit, no masses Cardiac: Irregular rate and rhythm, soft murmur, no rubs, or gallops Respiratory:  clear to auscultation bilaterally, normal work of breathing GI: soft, nontender, nondistended, + BS MS: no deformity or atrophy;  no edema; distal pulses are 2+ in all 4 extremities  Skin: warm and dry, no rash Neuro:  Strength and sensation are intact Psych: euthymic mood, full affect   EKG:  EKG is ordered today. The ekg ordered today demonstrates atrial flutter, variable AV block with rare PVCs, heart rate 72, no acute ischemic changes  ECHO: 09/05/2018 - Left ventricle: The cavity size was normal. Wall thickness was   increased in a pattern of mild LVH. Systolic function was normal.   The estimated ejection fraction was in the range of 55% to 60%.   Wall motion was normal; there were no regional wall motion   abnormalities. The study is not technically  sufficient to allow   evaluation of LV diastolic function. - Mitral valve: Mildly thickened leaflets . There was trivial   regurgitation. - Left atrium: The atrium was mildly dilated. - Tricuspid valve: There was trivial regurgitation. - Pulmonary arteries: PA peak pressure: 30 mm Hg (S). - Inferior vena cava: The vessel was dilated. The respirophasic   diameter changes were blunted (< 50%), consistent with elevated   central venous pressure.  Impressions:  - Compared to prior study in 2018, the LVEF is higher at 55-60%.  Left heart catheterization 09/04/18:  Colon Flattery 2nd Diag lesion is 50% stenosed.  Mid Cx to Dist Cx lesion is 90% stenosed.  Prox RCA to Mid RCA lesion is 100% stenosed.  Previously placed Prox LAD to Mid LAD stent (unknown type) is widely patent.  Post intervention, there is a 0% residual stenosis.  A drug-eluting stent was successfully placed using a STENT SIERRA 3.50 X 15 MM.  LV end diastolic pressure is normal.  1. 2 vessel obstructive CAD. - continued patency of prior LAD stents - 90% mid LCx- new from prior and the culprit lesion - 100% CTO of a nondominant RCA 2. Normal LVEDP 3. Successful PCI of the LCx with DES x 1.  Plan: Echo to assess LV function. DAPT. If no bleeding can start anticoagulation tomorrow.    Recommend to resume Apixaban, at currently prescribed dose and frequency, on 09/05/18. Recommend concurrent antiplatelet therapy of Aspirin 81mg  daily for 1 monthand Clopidogrel 75mg  daily for 12 months. Intervention      Recent Labs: 12/01/2017: ALT 17 09/05/2018: BUN 15; Creatinine, Ser 1.27; Hemoglobin 14.4; Platelets 209; Potassium 3.9; Sodium 136  CBC    Component Value Date/Time   WBC 9.4 09/05/2018 0534   RBC 4.75 09/05/2018 0534   HGB 14.4 09/05/2018 0534   HCT 44.0 09/05/2018 0534   PLT 209 09/05/2018 0534   MCV 92.6 09/05/2018 0534   MCH 30.3 09/05/2018 0534   MCHC 32.7 09/05/2018 0534   RDW 12.3 09/05/2018 0534   CMP Latest Ref Rng & Units 09/05/2018 09/04/2018 09/04/2018  Glucose 70 - 99 mg/dL 98 100(H) 111(H)  BUN 8 - 23 mg/dL 15 15 15   Creatinine 0.61 - 1.24 mg/dL 1.27(H) 1.10 1.21  Sodium 135 - 145 mmol/L 136 141 136  Potassium 3.5 - 5.1 mmol/L 3.9 3.9 6.0(H)  Chloride 98 - 111 mmol/L 107 106 106  CO2 22 - 32 mmol/L 21(L) - 22  Calcium 8.9 - 10.3 mg/dL 8.7(L) - 9.3  Total Protein 6.0 - 8.5 g/dL - - -  Total Bilirubin 0.0 - 1.2 mg/dL - - -  Alkaline Phos 39 - 117 IU/L - - -  AST 0 - 40 IU/L - - -  ALT 0 - 44 IU/L - - -     Lipid Panel Lab Results  Component Value Date   CHOL 121 12/01/2017   HDL 45 12/01/2017   LDLCALC 61 12/01/2017   TRIG 74 12/01/2017   CHOLHDL 2.7 12/01/2017      Wt Readings from Last 3 Encounters:  09/15/18 218 lb (98.9 kg)  09/05/18 209 lb 6.4 oz (95 kg)  02/16/18 218 lb 0.6 oz (98.9 kg)     Other studies Reviewed: Additional studies/ records that were reviewed today include: Office notes, hospital records and testing.  ASSESSMENT AND PLAN:  1.  Persistent atrial flutter: His rate is controlled on carvedilol 6.25 mg twice daily.  He is compliant with Eliquis. - he is concerned about symptoms  including fatigue.  He wishes to pursue sinus rhythm. - He and his family wish to wait until he does not need a TEE to  pursue this. - We will set up a cardioversion for after December 15.  That will be 3 full weeks of anticoagulation. - Dr. Ellyn Hack was contacted by phone and is okay with this plan. -He will come in the day before the procedure to get labs and get an EKG to make sure he is still in flutter. -If symptoms worsen between now and then, he is to call us.  2. CAD: He is not having any problems with aspirin and Plavix. - He is not having any ischemic symptoms. - he is encouraged to start increasing his activity, but be gradual about it. - Because of his 100% RCA, it was explained to he and his wife that he can have mild chest pain with exertion.  If it is relieved by rest in a short period of time or one sublingual nitroglycerin, just make sure they let us know how often this is happening when he is seen in the office.  If the symptoms are worse or require multiple nitroglycerin tablets, he should call 911 or call the office.  He and his wife are in agreement on this. - I explained that we could start him to or if his symptoms were frequent, but currently there is no indication for this.   Current medicines are reviewed at length with the patient today.  The patient does not have concerns regarding medicines.  The following changes have been made:  no change  Labs/ tests ordered today include:   Orders Placed This Encounter  Procedures  . CBC  . Basic metabolic panel  . EKG 12-Lead     Disposition:   FU with Glenetta Hew, MD  Signed, Rosaria Ferries, PA-C  09/15/2018 1:10 PM    [place] Phone: [phone]; Fax: [phone]

## 2018-09-14 NOTE — Progress Notes (Signed)
Cardiology Office Note   Date:  09/15/2018   ID:  Travis Key, DOB August 23, 1941, [name] [id number]  PCP:  [name], FNP Cardiologist:  Glenetta Hew, MD 09/04/2018 Rosaria Ferries, PA-C   No chief complaint on file.   History of Present Illness: Travis Key is a 77 y.o. male with a history of STEMI 09/2017 s/p overlapping DES LAD, HTN, HLD, Travis polyps   Admitted 11/22-11/23/2019 for USAP, s/p cath w/ DES CFX, med rx for CTO RCA, EF 55-60%, ASA x 1 month, Plavix x 12 mo. He was in atrial flutter on admit, started on apixaban 5mg  BID for CHA2DS2-VASc Score of 4 (HTN, vascular, and age >68). Consider DCCV after 3 weeks Eliquis, ck BMET at f/u  Travis Key presents for cardiology follow up.   He had some fatigue after d/c, but that has gotten better. However, his activity level is significantly lower than usual.  He was walking 2 miles a day, has not started back at all. Is aware that temp should be 45-85 degrees if he walks outside.   He does not want to do cardiac rehab, has done it before. Does not feel it will help him. Is willing to exercise regularly on his own.   No orthopnea or PND, no LE except a little daytime edema.   He never feels the atrial flutter, does not get light-headed or dizzy. However, he is concerned that his fatigue is from the atrial flutter.  Someone had mentioned cardioversion after several weeks of anticoagulation and he is interested in pursuing this.  No bleeding issues on the Eliquis.  He is sure that he has not missed any doses.   Past Medical History:  Diagnosis Date  . 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 Anal fissure   . CAD S/P LAD PCI for Antero-Posterior STEMI    12/18 STEMI PCI/DESx1 mLAD, CTO of RCA, normal EF  . Diverticulosis   . High cholesterol   . History of myocardial infarction: Anterior - posterior MI (LAD lesion - PCI) 09/14/2017  . Hypertension   . Internal hemorrhoids     Past Surgical History:  Procedure Laterality  Date  . CORONARY STENT INTERVENTION N/A 09/14/2017   Procedure: CORONARY STENT INTERVENTION;  Surgeon: Leonie Man, MD;  Location: [facility];  Service: Cardiovascular:: p-mLAD 95% (tandem lesions 80&95%) --> PCI with 2 overlapping Xience [place] DES (distal 4.0 x 38 -> prox 4.0 x 12 --> post-dilated to ~4.6 mm.], 0% residual  . CORONARY STENT INTERVENTION N/A 09/04/2018   Procedure: CORONARY STENT INTERVENTION;  Surgeon: Martinique, Peter M, MD;  Location: [facility];  Service: Cardiovascular;  Laterality: N/A;  . CORONARY/GRAFT ACUTE MI REVASCULARIZATION N/A 09/14/2017   Procedure: Coronary/Graft Acute MI Revascularization;  Surgeon: Leonie Man, MD;  Location: [facility];  Service: Cardiovascular;  Laterality: N/A;  . LEFT HEART CATH AND CORONARY ANGIOGRAPHY N/A 09/14/2017   Procedure: LEFT HEART CATH AND CORONARY ANGIOGRAPHY;  Surgeon: Leonie Man, MD;  Location: [facility];  p-m LAD 85-95% tandem lesions (PCI), 0% residual.  OstD2 ~50%, m-dCx 60%, LPL1 ~50%. Small Non-dom RCA 100% CTO ~mid.  EF ~45-50% with apical anterior, apical & inferoapical HK.  Marland Kitchen LEFT HEART CATH AND CORONARY ANGIOGRAPHY N/A 09/04/2018   Procedure: LEFT HEART CATH AND CORONARY ANGIOGRAPHY;  Surgeon: Martinique, Peter M, MD;  Location: [facility];  Service: Cardiovascular;  Laterality: N/A;  . ROTATOR CUFF REPAIR Right   .  TRANSTHORACIC ECHOCARDIOGRAM  09/14/2017   In setting of anterior STEMI: EF 50 and 55%.  Moderate LVH.  Moderate HK of mid-apical anteroseptal wall.  Mild aortic valve calcification.  Marland Kitchen UMBILICAL HERNIA REPAIR      Current Outpatient Medications  Medication Sig Dispense Refill  . amLODipine (NORVASC) 10 MG tablet Take 1 tablet (10 mg total) by mouth daily. 90 tablet 3  . apixaban (ELIQUIS) 5 MG TABS tablet Take 1 tablet (5 mg total) by mouth 2 (two) times daily. 60 tablet 0  . apixaban (ELIQUIS) 5 MG TABS tablet Take 1 tablet (5 mg total) by mouth 2 (two)  times daily. 180 tablet 3  . aspirin 81 MG chewable tablet Chew 1 tablet (81 mg total) by mouth daily. YOU SHOULD STOP TAKING THIS MEDICATION ON 10/05/18    . carvedilol (COREG) 6.25 MG tablet Take 1 tablet (6.25 mg total) by mouth 2 (two) times daily. 180 tablet 3  . clopidogrel (PLAVIX) 75 MG tablet Take 1 tablet (75 mg total) by mouth daily with breakfast. 90 tablet 3  . irbesartan (AVAPRO) 300 MG tablet Take 300 mg by mouth daily.    . nitroGLYCERIN (NITROSTAT) 0.4 MG SL tablet Place 1 tablet (0.4 mg total) under the tongue every 5 (five) minutes as needed. 25 tablet 2  . rosuvastatin (CRESTOR) 40 MG tablet Take 0.5 tablets (20 mg total) by mouth at bedtime. (Patient taking differently: Take 40 mg by mouth at bedtime. ) 90 tablet 1  . tetrahydrozoline 0.05 % ophthalmic solution Place 1 drop into both eyes daily.     No current facility-administered medications for this visit.     Allergies:   Patient has no known allergies.    Social History:  The patient  reports that he quit smoking about 33 years ago. His smoking use included cigarettes. He has never used smokeless tobacco. He reports that he does not drink alcohol or use drugs.   Family History:  The patient's family history includes Diabetes in his mother; Liver disease in his mother.  He indicated that the status of his mother is unknown.    ROS:  Please see the history of present illness. All other systems are reviewed and negative.    PHYSICAL EXAM: VS:  BP 130/72   Pulse 72   Ht 5\' 11"  (1.803 m)   Wt 218 lb (98.9 kg)   SpO2 99%   BMI 30.40 kg/m  , BMI Body mass index is 30.4 kg/m. GEN: Well nourished, well developed, male in no acute distress HEENT: normal for age  Neck: no JVD, no carotid bruit, no masses Cardiac: Irregular rate and rhythm, soft murmur, no rubs, or gallops Respiratory:  clear to auscultation bilaterally, normal work of breathing GI: soft, nontender, nondistended, + BS MS: no deformity or atrophy;  no edema; distal pulses are 2+ in all 4 extremities  Skin: warm and dry, no rash Neuro:  Strength and sensation are intact Psych: euthymic mood, full affect   EKG:  EKG is ordered today. The ekg ordered today demonstrates atrial flutter, variable AV block with rare PVCs, heart rate 72, no acute ischemic changes  ECHO: 09/05/2018 - Left ventricle: The cavity size was normal. Wall thickness was   increased in a pattern of mild LVH. Systolic function was normal.   The estimated ejection fraction was in the range of 55% to 60%.   Wall motion was normal; there were no regional wall motion   abnormalities. The study is not technically  sufficient to allow   evaluation of LV diastolic function. - Mitral valve: Mildly thickened leaflets . There was trivial   regurgitation. - Left atrium: The atrium was mildly dilated. - Tricuspid valve: There was trivial regurgitation. - Pulmonary arteries: PA peak pressure: 30 mm Hg (S). - Inferior vena cava: The vessel was dilated. The respirophasic   diameter changes were blunted (< 50%), consistent with elevated   central venous pressure.  Impressions:  - Compared to prior study in 2018, the LVEF is higher at 55-60%.  Left heart catheterization 09/04/18:  Travis Key 2nd Diag lesion is 50% stenosed.  Mid Cx to Dist Cx lesion is 90% stenosed.  Prox RCA to Mid RCA lesion is 100% stenosed.  Previously placed Prox LAD to Mid LAD stent (unknown type) is widely patent.  Post intervention, there is a 0% residual stenosis.  A drug-eluting stent was successfully placed using a STENT SIERRA 3.50 X 15 MM.  LV end diastolic pressure is normal.  1. 2 vessel obstructive CAD. - continued patency of prior LAD stents - 90% mid LCx- new from prior and the culprit lesion - 100% CTO of a nondominant RCA 2. Normal LVEDP 3. Successful PCI of the LCx with DES x 1.  Plan: Echo to assess LV function. DAPT. If no bleeding can start anticoagulation tomorrow.    Recommend to resume Apixaban, at currently prescribed dose and frequency, on 09/05/18. Recommend concurrent antiplatelet therapy of Aspirin 81mg  daily for 1 monthand Clopidogrel 75mg  daily for 12 months. Intervention      Recent Labs: 12/01/2017: ALT 17 09/05/2018: BUN 15; Creatinine, Ser 1.27; Hemoglobin 14.4; Platelets 209; Potassium 3.9; Sodium 136  CBC    Component Value Date/Time   WBC 9.4 09/05/2018 0534   RBC 4.75 09/05/2018 0534   HGB 14.4 09/05/2018 0534   HCT 44.0 09/05/2018 0534   PLT 209 09/05/2018 0534   MCV 92.6 09/05/2018 0534   MCH 30.3 09/05/2018 0534   MCHC 32.7 09/05/2018 0534   RDW 12.3 09/05/2018 0534   CMP Latest Ref Rng & Units 09/05/2018 09/04/2018 09/04/2018  Glucose 70 - 99 mg/dL 98 100(H) 111(H)  BUN 8 - 23 mg/dL 15 15 15   Creatinine 0.61 - 1.24 mg/dL 1.27(H) 1.10 1.21  Sodium 135 - 145 mmol/L 136 141 136  Potassium 3.5 - 5.1 mmol/L 3.9 3.9 6.0(H)  Chloride 98 - 111 mmol/L 107 106 106  CO2 22 - 32 mmol/L 21(L) - 22  Calcium 8.9 - 10.3 mg/dL 8.7(L) - 9.3  Total Protein 6.0 - 8.5 g/dL - - -  Total Bilirubin 0.0 - 1.2 mg/dL - - -  Alkaline Phos 39 - 117 IU/L - - -  AST 0 - 40 IU/L - - -  ALT 0 - 44 IU/L - - -     Lipid Panel Lab Results  Component Value Date   CHOL 121 12/01/2017   HDL 45 12/01/2017   LDLCALC 61 12/01/2017   TRIG 74 12/01/2017   CHOLHDL 2.7 12/01/2017      Wt Readings from Last 3 Encounters:  09/15/18 218 lb (98.9 kg)  09/05/18 209 lb 6.4 oz (95 kg)  02/16/18 218 lb 0.6 oz (98.9 kg)     Other studies Reviewed: Additional studies/ records that were reviewed today include: Office notes, hospital records and testing.  ASSESSMENT AND PLAN:  1.  Persistent atrial flutter: His rate is controlled on carvedilol 6.25 mg twice daily.  He is compliant with Eliquis. - he is concerned about symptoms  including fatigue.  He wishes to pursue sinus rhythm. - He and his family wish to wait until he does not need a TEE to  pursue this. - We will set up a cardioversion for after December 15.  That will be 3 full weeks of anticoagulation. - Dr. Ellyn Hack was contacted by phone and is okay with this plan. -He will come in the day before the procedure to get labs and get an EKG to make sure he is still in flutter. -If symptoms worsen between now and then, he is to call us.  2. CAD: He is not having any problems with aspirin and Plavix. - He is not having any ischemic symptoms. - he is encouraged to start increasing his activity, but be gradual about it. - Because of his 100% RCA, it was explained to he and his wife that he can have mild chest pain with exertion.  If it is relieved by rest in a short period of time or one sublingual nitroglycerin, just make sure they let us know how often this is happening when he is seen in the office.  If the symptoms are worse or require multiple nitroglycerin tablets, he should call 911 or call the office.  He and his wife are in agreement on this. - I explained that we could start him to or if his symptoms were frequent, but currently there is no indication for this.   Current medicines are reviewed at length with the patient today.  The patient does not have concerns regarding medicines.  The following changes have been made:  no change  Labs/ tests ordered today include:   Orders Placed This Encounter  Procedures  . CBC  . Basic metabolic panel  . EKG 12-Lead     Disposition:   FU with Glenetta Hew, MD  Signed, Rosaria Ferries, PA-C  09/15/2018 1:10 PM    [place] Phone: [phone]; Fax: [phone]

## 2018-09-15 ENCOUNTER — Encounter: Payer: Self-pay | Admitting: Physician Assistant

## 2018-09-15 ENCOUNTER — Ambulatory Visit (INDEPENDENT_AMBULATORY_CARE_PROVIDER_SITE_OTHER): Payer: Medicare Other | Admitting: Physician Assistant

## 2018-09-15 VITALS — BP 130/72 | HR 72 | Ht 71.0 in | Wt 218.0 lb

## 2018-09-15 DIAGNOSIS — I2510 Atherosclerotic heart disease of native coronary artery without angina pectoris: Secondary | ICD-10-CM | POA: Diagnosis not present

## 2018-09-15 DIAGNOSIS — Z9861 Coronary angioplasty status: Secondary | ICD-10-CM | POA: Diagnosis not present

## 2018-09-15 DIAGNOSIS — I484 Atypical atrial flutter: Secondary | ICD-10-CM

## 2018-09-15 NOTE — Patient Instructions (Addendum)
Medication Instructions:  The current medical regimen is effective;  continue present plan and medications.  If you need a refill on your cardiac medications before your next appointment, please call your pharmacy.   Lab work: CBC, BMET ON December 13TH If you have labs (blood work) drawn today and your tests are completely normal, you will receive your results only by: Marland Kitchen MyChart Message (if you have MyChart) OR . A paper copy in the mail If you have any lab test that is abnormal or we need to change your treatment, we will call you to review the results.  Testing/Procedures: Your physician has requested that you have a Cardioversion. Electrical Cardioversion uses a jolt of electricity to your heart either through paddles or wired patches attached to your chest. This is a controlled, usually prescheduled, procedure. This procedure is done at the hospital and you are not awake during the procedure. You usually go home the day of the procedure. Please see the instruction sheet given to you today for more information.   Follow-Up: At [facility], you and your health needs are our priority.  As part of our continuing mission to provide you with exceptional heart care, we have created designated Provider Care Teams.  These Care Teams include your primary Cardiologist (physician) and Advanced Practice Providers (APPs -  Physician Assistants and Nurse Practitioners) who all work together to provide you with the care you need, when you need it. .   Any Other Special Instructions Will Be Listed Below (If Applicable). [id number] Case ID         You are scheduled for a TEE/Cardioversion/TEE Cardioversion on December 16th with Dr. Aundra Dubin.  Please arrive at the [facility] (Main Entrance A) at [facility]: [address] [place], [place] [address] at 6 am/pm. (1 hour prior to procedure unless lab work is needed; if lab work is needed arrive 1.5 hours ahead)  DIET: Nothing to eat or drink  after midnight except a sip of water with medications (see medication instructions below)  Medication Instructions:  Continue your anticoagulant: Eliquis Hold COREG the day of procedure.    Labs: If patient is on Coumadin, patient needs pt/INR, CBC, BMET within 3 days (No pt/INR needed for patients taking Xarelto, Eliquis, Pradaxa) For patients receiving anesthesia for TEE and all Cardioversion patients: BMET, CBC within 1 week  COME FOR LAB: December 13TH   You must have a responsible person to drive you home and stay in the waiting area during your procedure. Failure to do so could result in cancellation.  Bring your insurance cards.  *Special Note: Every effort is made to have your procedure done on time. Occasionally there are emergencies that occur at the hospital that may cause delays. Please be patient if a delay does occur.

## 2018-09-23 ENCOUNTER — Telehealth: Payer: Self-pay | Admitting: Family

## 2018-09-23 NOTE — Telephone Encounter (Signed)
Pt appears on [facility] Quality Report for [facility], but hasn't been seen since April 2017.  I called the pt and left a message asking him to call me at [phone] to confirm his PCP.  VDM (DD)

## 2018-09-25 ENCOUNTER — Ambulatory Visit (INDEPENDENT_AMBULATORY_CARE_PROVIDER_SITE_OTHER): Payer: Medicare Other

## 2018-09-25 ENCOUNTER — Other Ambulatory Visit: Payer: Self-pay

## 2018-09-25 DIAGNOSIS — I484 Atypical atrial flutter: Secondary | ICD-10-CM

## 2018-09-25 DIAGNOSIS — I4819 Other persistent atrial fibrillation: Secondary | ICD-10-CM | POA: Diagnosis not present

## 2018-09-25 MED ORDER — APIXABAN 5 MG PO TABS: 5.0000 mg | ORAL_TABLET | Freq: Two times a day (BID) | ORAL | 0 refills | Status: DC

## 2018-09-25 NOTE — Progress Notes (Signed)
Patient came in office to have EKG completed before Cardioversion on Monday 09/28/18. Showed EKG to Dr.Jordan DOD today, stated that patient still in Atrial Flutter to continue plan for Cardioversion. Will notify Rosaria Ferries, PA-C.

## 2018-09-26 LAB — BASIC METABOLIC PANEL
BUN / CREAT RATIO: 12 (ref 10–24)
BUN: 14 mg/dL (ref 8–27)
CO2: 23 mmol/L (ref 20–29)
CREATININE: 1.17 mg/dL (ref 0.76–1.27)
Calcium: 9.7 mg/dL (ref 8.6–10.2)
Chloride: 101 mmol/L (ref 96–106)
GFR calc Af Amer: 69 mL/min/{1.73_m2} (ref >59)
GFR, EST NON AFRICAN AMERICAN: 60 mL/min/{1.73_m2} (ref >59)
GLUCOSE: 92 mg/dL (ref 65–99)
Potassium: 4.6 mmol/L (ref 3.5–5.2)
Sodium: 141 mmol/L (ref 134–144)

## 2018-09-26 LAB — CBC

## 2018-09-27 ENCOUNTER — Encounter (HOSPITAL_COMMUNITY): Payer: Self-pay | Admitting: Anesthesiology

## 2018-09-27 NOTE — Anesthesia Preprocedure Evaluation (Addendum)
Anesthesia Evaluation  Patient identified by [name], date of birth, ID band Patient awake    Reviewed: Allergy & Precautions, NPO status , Patient's Chart, lab work & pertinent test results  Airway Mallampati: I       Dental  (+) Upper Dentures, Lower Dentures   Pulmonary former smoker,    Pulmonary exam normal        Cardiovascular hypertension, Pt. on medications and Pt. on home beta blockers + dysrhythmias Atrial Fibrillation  Rhythm:Irregular Rate:Normal     Neuro/Psych negative neurological ROS  negative psych ROS   GI/Hepatic negative GI ROS, Neg liver ROS,   Endo/Other    Renal/GU negative Renal ROS     Musculoskeletal negative musculoskeletal ROS (+)   Abdominal Normal abdominal exam  (+)   Peds  Hematology negative hematology ROS (+)   Anesthesia Other Findings status: Final result                             *[place]*                   *[facility]*                         [address]   [place] [place] lesion is 50% stenosed.  Mid Cx to Dist Cx lesion is 90% stenosed.  Prox RCA to Mid RCA lesion is 100% stenosed.  Previously placed Prox LAD to Mid LAD stent (unknown type) is widely patent.  Post intervention, there is a 0% residual stenosis.  A drug-eluting stent was successfully placed using a STENT SIERRA 3.50 X 15 MM.  LV end diastolic pressure is normal.   1. 2 vessel obstructive CAD.    - continued patency of prior LAD stents    - 90% mid LCx- new from prior and the culprit lesion    - 100% CTO of a nondominant RCA 2. Normal LVEDP 3. Successful PCI of the LCx with DES x 1.  Plan: Echo to assess LV function. DAPT. If no bleeding can start anticoagulation tomorrow.                            [place], [place] [address]                            [phone]  ------------------------------------------------------------------- Transthoracic  Echocardiography  Patient:    Kollyn, Lingafelter MR #:       [id number] Study Date: 09/05/2018 Gender:     M Age:        77 Height:     181.6 cm Weight:     95 kg BSA:        2.21 m^2 Pt. Status: Room:       6C05C   ORDERING     Peter Martinique, M.D.  REFERRING    Peter Martinique, M.D.  ADMITTING    Glenetta Hew, MD  ATTENDING    Glenetta Hew, MD  PERFORMING   [facility], Inpatient  SONOGRAPHER  Madelaine Etienne  cc:  ------------------------------------------------------------------- LV EF: 55% -   60%  ------------------------------------------------------------------- Indications:      Abnormal EKG 794.31.  ------------------------------------------------------------------- Study Conclusions  - Left ventricle: The cavity size was normal. Wall thickness was   increased in a pattern of mild LVH. Systolic function was normal.  The estimated ejection fraction was in the range of 55% to 60%.   Wall motion was normal; there were no regional wall motion   abnormalities. The study is not technically sufficient to allow   evaluation of LV diastolic function. - Mitral valve: Mildly thickened leaflets . There was trivial   regurgitation. - Left atrium: The atrium was mildly dilated. - Tricuspid valve: There was trivial regurgitation. - Pulmonary arteries: PA peak pressure: 30 mm Hg (S). - Inferior vena cava: The vessel was dilated. The respirophasic   diameter changes were blunted (< 50%), consistent with elevated   central venous pressure.     Reproductive/Obstetrics                           Anesthesia Physical Anesthesia Plan  ASA: III  Anesthesia Plan: General   Post-op Pain Management:    Induction:   PONV Risk Score and Plan:   Airway Management Planned: Natural Airway and Mask  Additional Equipment:   Intra-op Plan:   Post-operative Plan:   Informed Consent: I have reviewed the patients History and Physical, chart, labs and discussed the  procedure including the risks, benefits and alternatives for the proposed anesthesia with the patient or authorized representative who has indicated his/her understanding and acceptance.     Plan Discussed with: CRNA  Anesthesia Plan Comments:        Anesthesia Quick Evaluation

## 2018-09-28 ENCOUNTER — Ambulatory Visit (HOSPITAL_COMMUNITY): Payer: Medicare Other | Admitting: Anesthesiology

## 2018-09-28 ENCOUNTER — Encounter (HOSPITAL_COMMUNITY)
Admission: RE | Disposition: A | Discharge status: Home / Self Care | Payer: Self-pay | Source: Home / Self Care | Attending: Cardiology

## 2018-09-28 ENCOUNTER — Ambulatory Visit (HOSPITAL_COMMUNITY)
Admission: RE | Admit: 2018-09-28 | Discharge: 2018-09-28 | Disposition: A | Discharge status: Home / Self Care | Payer: Medicare Other | Attending: Cardiology | Admitting: Cardiology

## 2018-09-28 ENCOUNTER — Other Ambulatory Visit: Payer: Self-pay

## 2018-09-28 ENCOUNTER — Telehealth: Payer: Self-pay | Admitting: Family

## 2018-09-28 ENCOUNTER — Encounter (HOSPITAL_COMMUNITY): Payer: Self-pay | Admitting: *Deleted

## 2018-09-28 DIAGNOSIS — I4892 Unspecified atrial flutter: Secondary | ICD-10-CM | POA: Insufficient documentation

## 2018-09-28 DIAGNOSIS — I252 Old myocardial infarction: Secondary | ICD-10-CM | POA: Diagnosis not present

## 2018-09-28 DIAGNOSIS — E7800 Pure hypercholesterolemia, unspecified: Secondary | ICD-10-CM | POA: Insufficient documentation

## 2018-09-28 DIAGNOSIS — I10 Essential (primary) hypertension: Secondary | ICD-10-CM | POA: Insufficient documentation

## 2018-09-28 DIAGNOSIS — I2510 Atherosclerotic heart disease of native coronary artery without angina pectoris: Secondary | ICD-10-CM | POA: Diagnosis not present

## 2018-09-28 DIAGNOSIS — I4891 Unspecified atrial fibrillation: Secondary | ICD-10-CM | POA: Insufficient documentation

## 2018-09-28 DIAGNOSIS — Z7982 Long term (current) use of aspirin: Secondary | ICD-10-CM | POA: Diagnosis not present

## 2018-09-28 DIAGNOSIS — Z955 Presence of coronary angioplasty implant and graft: Secondary | ICD-10-CM | POA: Insufficient documentation

## 2018-09-28 DIAGNOSIS — Z7902 Long term (current) use of antithrombotics/antiplatelets: Secondary | ICD-10-CM | POA: Insufficient documentation

## 2018-09-28 DIAGNOSIS — Z7901 Long term (current) use of anticoagulants: Secondary | ICD-10-CM | POA: Diagnosis not present

## 2018-09-28 DIAGNOSIS — Z87891 Personal history of nicotine dependence: Secondary | ICD-10-CM | POA: Insufficient documentation

## 2018-09-28 HISTORY — PX: CARDIOVERSION: SHX1299

## 2018-09-28 LAB — POCT I-STAT 4, (NA,K, GLUC, HGB,HCT)
GLUCOSE: 91 mg/dL (ref 70–99)
HCT: 48 % (ref 39.0–52.0)
Hemoglobin: 16.3 g/dL (ref 13.0–17.0)
Potassium: 4.3 mmol/L (ref 3.5–5.1)
Sodium: 142 mmol/L (ref 135–145)

## 2018-09-28 SURGERY — CARDIOVERSION
Anesthesia: General

## 2018-09-28 MED ORDER — PROPOFOL 10 MG/ML IV BOLUS
INTRAVENOUS | Status: DC | PRN
  Administered 2018-09-28: 70 mg via INTRAVENOUS

## 2018-09-28 MED ORDER — SODIUM CHLORIDE 0.9 % IV SOLN
INTRAVENOUS | Status: DC | PRN
  Administered 2018-09-28: 07:00:00 via INTRAVENOUS

## 2018-09-28 MED ORDER — LIDOCAINE HCL 1 % IJ SOLN
INTRAMUSCULAR | Status: DC | PRN
  Administered 2018-09-28: 60 mg via INTRADERMAL

## 2018-09-28 MED ORDER — SODIUM CHLORIDE 0.9 % IV SOLN: INTRAVENOUS | Status: DC

## 2018-09-28 NOTE — Telephone Encounter (Signed)
2nd attempt to reach patient to confirm PCP.  There was no answer and no option to leave a message. VDM (DD)

## 2018-09-28 NOTE — Interval H&P Note (Signed)
History and Physical Interval Note:  09/28/2018 7:39 AM  Travis Key  has presented today for surgery, with the diagnosis of AFLUTTER  The various methods of treatment have been discussed with the patient and family. After consideration of risks, benefits and other options for treatment, the patient has consented to  Procedure(s): CARDIOVERSION (N/A) as a surgical intervention .  The patient's history has been reviewed, patient examined, no change in status, stable for surgery.  I have reviewed the patient's chart and labs.  Questions were answered to the patient's satisfaction.     [place] [employer]

## 2018-09-28 NOTE — Transfer of Care (Signed)
Immediate Anesthesia Transfer of Care Note  Patient: Travis Key  Procedure(s) Performed: CARDIOVERSION (N/A )  Patient Location: Endoscopy Unit  Anesthesia Type:General  Level of Consciousness: awake, alert  and oriented  Airway & Oxygen Therapy: Patient Spontanous Breathing and Patient connected to nasal cannula oxygen  Post-op Assessment: Report given to RN, Post -op Vital signs reviewed and stable and Patient moving all extremities X 4  Post vital signs: Reviewed and stable  Last Vitals:  Vitals Value Taken Time  BP    Temp    Pulse 59 09/28/2018  7:48 AM  Resp 23 09/28/2018  7:48 AM  SpO2 98 % 09/28/2018  7:48 AM  Vitals shown include unvalidated device data.  Last Pain:  Vitals:   09/28/18 0707  TempSrc: Oral  PainSc: 0-No pain         Complications: No apparent anesthesia complications

## 2018-09-28 NOTE — Anesthesia Procedure Notes (Signed)
Procedure Name: General with mask airway Date/Time: 09/28/2018 7:40 AM Performed by: Neldon Newport, CRNA Pre-anesthesia Checklist: Patient identified, Emergency Drugs available, Suction available, Patient being monitored and Timeout performed Patient Re-evaluated:Patient Re-evaluated prior to induction Oxygen Delivery Method: Ambu bag Preoxygenation: Pre-oxygenation with 100% oxygen Induction Type: IV induction Ventilation: Mask ventilation without difficulty Dental Injury: Teeth and Oropharynx as per pre-operative assessment

## 2018-09-28 NOTE — Procedures (Signed)
Electrical Cardioversion Procedure Note Travis Key [id number] 03-23-1941  Procedure: Electrical Cardioversion Indications:  Atrial Fibrillation  Procedure Details Consent: Risks of procedure as well as the alternatives and risks of each were explained to the (patient/caregiver).  Consent for procedure obtained. Time Out: Verified patient identification, verified procedure, site/side was marked, verified correct patient position, special equipment/implants available, medications/allergies/relevent history reviewed, required imaging and test results available.  Performed  Patient placed on cardiac monitor, pulse oximetry, supplemental oxygen as necessary.  Sedation given: Propofol per anesthesiology Pacer pads placed anterior and posterior chest.  Cardioverted 1 time(s).  Cardioverted at 150J.  Evaluation Findings: Post procedure EKG shows: NSR Complications: None Patient did tolerate procedure well.   Loralie Champagne 09/28/2018, 7:40 AM

## 2018-09-28 NOTE — Addendum Note (Signed)
Addendum  created 09/28/18 0830 by Neldon Newport, CRNA   Charge Capture section accepted

## 2018-09-28 NOTE — Anesthesia Postprocedure Evaluation (Signed)
Anesthesia Post Note  Patient: Travis Key  Procedure(s) Performed: CARDIOVERSION (N/A )     Patient location during evaluation: Endoscopy Anesthesia Type: General Level of consciousness: awake and sedated Pain management: pain level controlled Vital Signs Assessment: post-procedure vital signs reviewed and stable Respiratory status: spontaneous breathing Cardiovascular status: stable Postop Assessment: no apparent nausea or vomiting Anesthetic complications: no    Last Vitals:  Vitals:   09/28/18 0707  BP: (!) 157/90  Resp: 17  Temp: 36.8 C  SpO2: 99%    Last Pain:  Vitals:   09/28/18 0707  TempSrc: Oral  PainSc: 0-No pain   Pain Goal:                 Huston Foley

## 2018-09-28 NOTE — Discharge Instructions (Signed)
Electrical Cardioversion, Care After °This sheet gives you information about how to care for yourself after your procedure. Your health care provider may also give you more specific instructions. If you have problems or questions, contact your health care provider. °What can I expect after the procedure? °After the procedure, it is common to have: °· Some redness on the skin where the shocks were given. ° °Follow these instructions at home: °· Do not drive for 24 hours if you were given a medicine to help you relax (sedative). °· Take over-the-counter and prescription medicines only as told by your health care provider. °· Ask your health care provider how to check your pulse. Check it often. °· Rest for 48 hours after the procedure or as told by your health care provider. °· Avoid or limit your caffeine use as told by your health care provider. °Contact a health care provider if: °· You feel like your heart is beating too quickly or your pulse is not regular. °· You have a serious muscle cramp that does not go away. °Get help right away if: °· You have discomfort in your chest. °· You are dizzy or you feel faint. °· You have trouble breathing or you are short of breath. °· Your speech is slurred. °· You have trouble moving an arm or leg on one side of your body. °· Your fingers or toes turn cold or blue. °This information is not intended to replace advice given to you by your health care provider. Make sure you discuss any questions you have with your health care provider. °Document Released: 07/21/2013 Document Revised: 05/03/2016 Document Reviewed: 04/05/2016 °Elsevier Interactive Patient Education © 2018 [employer] ° °

## 2018-09-29 ENCOUNTER — Telehealth: Payer: Self-pay | Admitting: Cardiology

## 2018-09-29 NOTE — Telephone Encounter (Signed)
New message:  Patient calling because he would like to speak with some one about getting a order to take a sleep study or some  Other type of test. please call patient.

## 2018-09-29 NOTE — Telephone Encounter (Signed)
Spoke with patient and he does have issues with snoring, apnea, and fatigue. Explained to patient that secondary to insurance rules and need for prior approval that this needs to be evaluated/discussed during office visit.  Offered sooner follow up appointment but he will just keep as scheduled. Advised to call back if anything changes.

## 2018-10-01 ENCOUNTER — Telehealth: Payer: Self-pay | Admitting: Family

## 2018-10-01 NOTE — Telephone Encounter (Signed)
3rd attempt to reach pt and confirm PCP. VDM (DD)

## 2018-10-02 DIAGNOSIS — I10 Essential (primary) hypertension: Secondary | ICD-10-CM | POA: Diagnosis not present

## 2018-10-02 DIAGNOSIS — H35039 Hypertensive retinopathy, unspecified eye: Secondary | ICD-10-CM | POA: Diagnosis not present

## 2018-10-02 DIAGNOSIS — H25099 Other age-related incipient cataract, unspecified eye: Secondary | ICD-10-CM | POA: Diagnosis not present

## 2018-10-02 DIAGNOSIS — H5212 Myopia, left eye: Secondary | ICD-10-CM | POA: Diagnosis not present

## 2018-10-02 DIAGNOSIS — H11159 Pinguecula, unspecified eye: Secondary | ICD-10-CM | POA: Diagnosis not present

## 2018-10-02 DIAGNOSIS — H5201 Hypermetropia, right eye: Secondary | ICD-10-CM | POA: Diagnosis not present

## 2018-10-02 DIAGNOSIS — H52221 Regular astigmatism, right eye: Secondary | ICD-10-CM | POA: Diagnosis not present

## 2018-10-02 DIAGNOSIS — H524 Presbyopia: Secondary | ICD-10-CM | POA: Diagnosis not present

## 2018-10-23 ENCOUNTER — Ambulatory Visit (INDEPENDENT_AMBULATORY_CARE_PROVIDER_SITE_OTHER): Payer: Medicare Other | Admitting: Cardiology

## 2018-10-23 ENCOUNTER — Encounter: Payer: Self-pay | Admitting: Cardiology

## 2018-10-23 VITALS — BP 132/76 | HR 60 | Ht 71.0 in | Wt 220.0 lb

## 2018-10-23 DIAGNOSIS — E785 Hyperlipidemia, unspecified: Secondary | ICD-10-CM | POA: Diagnosis not present

## 2018-10-23 DIAGNOSIS — I10 Essential (primary) hypertension: Secondary | ICD-10-CM

## 2018-10-23 DIAGNOSIS — I2510 Atherosclerotic heart disease of native coronary artery without angina pectoris: Secondary | ICD-10-CM | POA: Diagnosis not present

## 2018-10-23 DIAGNOSIS — I480 Paroxysmal atrial fibrillation: Secondary | ICD-10-CM

## 2018-10-23 DIAGNOSIS — I493 Ventricular premature depolarization: Secondary | ICD-10-CM

## 2018-10-23 DIAGNOSIS — Z9861 Coronary angioplasty status: Secondary | ICD-10-CM | POA: Diagnosis not present

## 2018-10-23 DIAGNOSIS — I4892 Unspecified atrial flutter: Secondary | ICD-10-CM | POA: Diagnosis not present

## 2018-10-23 DIAGNOSIS — R400 Somnolence: Secondary | ICD-10-CM | POA: Insufficient documentation

## 2018-10-23 MED ORDER — CLOPIDOGREL BISULFATE 75 MG PO TABS: 75.0000 mg | ORAL_TABLET | Freq: Every day | ORAL | 3 refills | Status: DC

## 2018-10-23 MED ORDER — CARVEDILOL 6.25 MG PO TABS: 6.2500 mg | ORAL_TABLET | Freq: Two times a day (BID) | ORAL | 3 refills | Status: DC

## 2018-10-23 MED ORDER — APIXABAN 5 MG PO TABS: 5.0000 mg | ORAL_TABLET | Freq: Two times a day (BID) | ORAL | 3 refills | Status: DC

## 2018-10-23 NOTE — Progress Notes (Signed)
PCP: [name], FNP  Clinic Note: Chief Complaint  Patient presents with  . Hospitalization Follow-up    post DCCV  . Atrial Fibrillation    s/p DCCV  . Coronary Artery Disease    Recent NSTEMI - Cx PCI    HPI: Travis Key is a 78 y.o. male with a PMH of CAD (STEMI 09/2017 - LAD PCI, [place]  08/2018 - PCI Cfx, Med Rx for CTO RCA, EF 55-60%) & Atrial Flutter who presents today for post DCCV f/u.   Travis Key was last seen on 09/15/2018 (by Rosaria Ferries, PA-C) for NSTEMI f/u. Fatigue with A flutter -> planned 3 wks Eliquis & DCCV.  He had not yet gotten back to his 2 mile a day walks at the time.  Does not do well walking in the cold.  Likes to be warmer. --He also notes that while he was in the hospital, he was told that he has episodes of bradycardia and hypoxia overnight, and there was recommendation that he undergo a sleep apnea study.  Recent Hospitalizations:   Admitted for non-STEMI November 22-23 2019 -PCI to distal circumflex.  Successful DCCV/cardioversion 09/28/2018  Studies Personally Reviewed - (if available, images/films reviewed: From Epic Chart or Care Everywhere)  CARDIAC CATH-PCI 09/04/2018: m-dCx 90% (DES PCI Xience Sierra 3.5 x 15 -- 3.8 mm), patent p-mLAD (STENT SIERRA 4.00 X 38 MM. --Postdilated to 4.6 mm) stent with stable 50% ost D2  (jailed). known CTO of pRCA with R-R collaterals (non-dominant).        TTE 09/05/2018: EF up to 55-60%.  No R WMA.  Unable to assess diastolic function because of atrial fibrillation.  Mild LA dilation.  Trivial MR.   DCCV 09/28/2018   Interval History: Travis Key returns here today overall feeling quite well.  He is not having any more symptoms to suggest recurrence of A. fib.  No irregular heartbeats or palpitations.  No recurrent chest pain or pressure with rest or exertion.  Nothing to suggest any angina.  No heart failure symptoms of PND, orthopnea or edema.  No palpitations, lightheadedness, dizziness, weakness  or syncope/near syncope. No TIA/amaurosis fugax symptoms. No melena, hematochezia, hematuria, or epstaxis -on Plavix plus Eliquis (no longer on aspirin). No claudication.   Epworth Sleepiness Scale: Situation   Chance of Dozing/Sleeping (0 = never , 1 = slight chance , 2 = moderate chance , 3 = high chance )   sitting and reading  3   watching TV  3   sitting inactive in a public place  0   being a passenger in a motor vehicle for an hour or more  3   lying down in the afternoon  3   sitting and talking to someone  1   sitting quietly after lunch (no alcohol)  3   while stopped for a few minutes in traffic as the driver  0   Total Score   16; has hypertension, atrial fibrillation.  Does snore.  Does wake up feeling not rested with frequent headaches.  Wakes up to urinate at night.  Wakes up with dry mouth/sore throat.  Has been told that he stops breathing at night.    ROS: A comprehensive was performed. Review of Systems  Constitutional: Positive for malaise/fatigue (See Epworth score). Negative for chills, fever and weight loss.  HENT: Positive for sore throat (See Epworth score). Negative for congestion and sinus pain.   Respiratory: Negative for cough, shortness of breath and wheezing.  Gastrointestinal: Negative for heartburn.  Musculoskeletal: Negative for joint pain and myalgias.  Neurological: Negative for dizziness, focal weakness and weakness.  Endo/Heme/Allergies: Does not bruise/bleed easily.  Psychiatric/Behavioral: Negative for memory loss. The patient is not nervous/anxious and does not have insomnia (See Epworth score).     I have reviewed and (if needed) personally updated the patient's problem list, medications, allergies, past medical and surgical history, social and family history.   Past Medical History:  Diagnosis Date  . Adenomatous polyp of colon 08/2003  . Anal fissure   . CAD S/P LAD PCI for Antero-Posterior STEMI 09/2017; 08/2018   a) 12/18 STEMI  PCI/DESx1 (Xience Sierra 4.0 x 38 - 4.6 mm) mLAD, CTO of RCA, normal EF; b) 90% m-dCx (DES PCI - Xience Sierra 3.5 x 15).  patent LAD stent, stable known non-dom RCA CTO.  . Diverticulosis   . High cholesterol   . History of myocardial infarction: Anterior - posterior MI (LAD lesion - PCI) 09/14/2017   a) Ant STEMI: STENT SIERRA 4.00 X 38 MM. --Postdilated to 4.6 mm p-m LAD;; b)  NSTEMI 08/2018 - STENT SIERRA 3.5 X 15 MM) m-dCx  . Hypertension   . Internal hemorrhoids     Past Surgical History:  Procedure Laterality Date  . CARDIOVERSION N/A 09/28/2018   Procedure: CARDIOVERSION;  Surgeon: Larey Dresser, MD;  Location: [facility] ENDOSCOPY;  Service: Cardiovascular;  Laterality: N/A;  . CORONARY STENT INTERVENTION N/A 09/14/2017   Procedure: CORONARY STENT INTERVENTION;  Surgeon: Leonie Man, MD;  Location: [place] CV LAB;  Service: Cardiovascular:: p-mLAD 95% (tandem lesions 80&95%) --> PCI with 2 overlapping Xience [place] DES (distal 4.0 x 38 -> prox 4.0 x 12 --> post-dilated to ~4.6 mm.], 0% residual  . CORONARY STENT INTERVENTION N/A 09/04/2018   Procedure: CORONARY STENT INTERVENTION;  Surgeon: Martinique, Peter M, MD;  Location: [place] CV LAB;  Service: Cardiovascular: m-dCx 90% (DES PCI Xience Sierra 3.5 x 15 -- 3.8 mm)  . CORONARY/GRAFT ACUTE MI REVASCULARIZATION N/A 09/14/2017   Procedure: Coronary/Graft Acute MI Revascularization;  Surgeon: Leonie Man, MD;  Location: [place] CV LAB;  Service: Cardiovascular;  Laterality: N/A;  . LEFT HEART CATH AND CORONARY ANGIOGRAPHY N/A 09/14/2017   Procedure: LEFT HEART CATH AND CORONARY ANGIOGRAPHY;  Surgeon: Leonie Man, MD;  Location: [place] CV LAB;  p-m LAD 85-95% tandem lesions (PCI), 0% residual.  OstD2 ~50%, m-dCx 60%, LPL1 ~50%. Small Non-dom RCA 100% CTO ~mid.  EF ~45-50% with apical anterior, apical & inferoapical HK.  Marland Kitchen LEFT HEART CATH AND CORONARY ANGIOGRAPHY N/A 09/04/2018   Procedure: LEFT HEART CATH AND  CORONARY ANGIOGRAPHY;  Surgeon: Martinique, Peter M, MD;  Location: [place] CV LAB;  Service: Cardiovascular: m-dCx 90% (DES PCI). Patent p-mLAD (STENT SIERRA 4.00 X 38 MM. --Postdilated to 4.6 mm) stent with stable 50% ost D2  (jailed). known CTO of pRCA with R-R collaterals (non-dominant).    . ROTATOR CUFF REPAIR Right   . TRANSTHORACIC ECHOCARDIOGRAM  09/2017; 08/2018   a) In setting of anterior STEMI: EF 50 and 55%.  Moderate LVH.  Moderate HK of mid-apical anteroseptal wall.  Mild aortic valve calcification.;; b) EF up to 55-60%.  No R WMA.  Unable to assess diastolic function because of atrial fibrillation.  Mild LA dilation.  Trivial MR.   Marland Kitchen UMBILICAL HERNIA REPAIR      Current Meds  Medication Sig  . amLODipine (NORVASC) 10 MG tablet Take 1 tablet (10 mg total)  by mouth daily.  Marland Kitchen apixaban (ELIQUIS) 5 MG TABS tablet Take 1 tablet (5 mg total) by mouth 2 (two) times daily.  . carvedilol (COREG) 6.25 MG tablet Take 1 tablet (6.25 mg total) by mouth 2 (two) times daily.  . clopidogrel (PLAVIX) 75 MG tablet Take 1 tablet (75 mg total) by mouth daily with breakfast.  . irbesartan (AVAPRO) 300 MG tablet Take 300 mg by mouth daily.  . nitroGLYCERIN (NITROSTAT) 0.4 MG SL tablet Place 1 tablet (0.4 mg total) under the tongue every 5 (five) minutes as needed.  . rosuvastatin (CRESTOR) 40 MG tablet Take 0.5 tablets (20 mg total) by mouth at bedtime. (Patient taking differently: Take 40 mg by mouth at bedtime. )  . tetrahydrozoline 0.05 % ophthalmic solution Place 1 drop into both eyes daily.  . [DISCONTINUED] apixaban (ELIQUIS) 5 MG TABS tablet Take 1 tablet (5 mg total) by mouth 2 (two) times daily.  . [DISCONTINUED] aspirin 81 MG chewable tablet Chew 1 tablet (81 mg total) by mouth daily. YOU SHOULD STOP TAKING THIS MEDICATION ON 10/05/18  . [DISCONTINUED] carvedilol (COREG) 6.25 MG tablet Take 1 tablet (6.25 mg total) by mouth 2 (two) times daily.  . [DISCONTINUED] clopidogrel (PLAVIX) 75 MG  tablet Take 1 tablet (75 mg total) by mouth daily with breakfast.    No Known Allergies  Social History   Tobacco Use  . Smoking status: Former Smoker    Types: Cigarettes    Last attempt to quit: 03/02/1985    Years since quitting: 33.6  . Smokeless tobacco: Never Used  Substance Use Topics  . Alcohol use: No    Alcohol/week: 0.0 standard drinks  . Drug use: No   Social History   Social History Narrative  . Not on file    family history includes Diabetes in his mother; Liver disease in his mother.  Wt Readings from Last 3 Encounters:  10/23/18 220 lb (99.8 kg)  09/28/18 218 lb (98.9 kg)  09/15/18 218 lb (98.9 kg)    PHYSICAL EXAM BP 132/76   Pulse 60   Ht 5\' 11"  (1.803 m)   Wt 220 lb (99.8 kg)   BMI 30.68 kg/m  Physical Exam  Constitutional: He is oriented to person, place, and time. He appears well-developed and well-nourished. No distress.  Healthy-appearing well-groomed  HENT:  Head: Normocephalic and atraumatic.  Neck: Normal range of motion. Neck supple. No hepatojugular reflux and no JVD present. Carotid bruit is not present.  Cardiovascular: Normal rate, regular rhythm, normal heart sounds and intact distal pulses.  No extrasystoles are present. PMI is not displaced. Exam reveals no gallop and no friction rub.  No murmur heard. Pulmonary/Chest: Effort normal and breath sounds normal. No respiratory distress. He has no wheezes. He has no rales.  Abdominal: Soft. Bowel sounds are normal. He exhibits no distension. There is no abdominal tenderness. There is no rebound.  Musculoskeletal: Normal range of motion.        General: No edema.  Neurological: He is alert and oriented to person, place, and time.  Psychiatric: He has a normal mood and affect. His behavior is normal. Judgment and thought content normal.  Vitals reviewed.    Adult ECG Report  Rate: 60 ;  Rhythm: normal sinus rhythm and premature ventricular contractions (PVC); normal axis, intervals &  durations. .  Narrative Interpretation: Otherwise normal EKG.   Other studies Reviewed: Additional studies/ records that were reviewed today include:  Recent Labs: Checked today. Lab Results  Component  Value Date   CHOL 135 10/23/2018   HDL 48 10/23/2018   LDLCALC 72 10/23/2018   TRIG 73 10/23/2018   CHOLHDL 2.8 10/23/2018    ASSESSMENT / PLAN: Problem List Items Addressed This Visit    CAD S/P percutaneous coronary angioplasty - Primary (Chronic)    With atrial fibrillation on Eliquis, he is now on Plavix without aspirin.  Is on carvedilol and statin.  No recurrent angina following last PCI.  No recurrent anginal symptoms.      Relevant Medications   apixaban (ELIQUIS) 5 MG TABS tablet   carvedilol (COREG) 6.25 MG tablet   Other Relevant Orders   EKG 12-Lead   Comprehensive metabolic panel (Completed)   CBC (Completed)   Split night study   Coronary artery disease, occluded RCA with bridging collaterals and left-to-right collaterals (Chronic)    No angina.  No heart failure. Is on amlodipine, and carvedilol along with irbesartan.    Borderline target at 20 mg rosuvastatin --> with LDL at 72, will ask that he go back to 40 mg.      Relevant Medications   apixaban (ELIQUIS) 5 MG TABS tablet   carvedilol (COREG) 6.25 MG tablet   Daytime sleepiness    Epworth score of 16. Plan: Sleep study      Relevant Orders   Split night study   Essential hypertension (Chronic)    Initial blood pressure check today was 160/76, but on my recheck was actually more like 136/76.  Is on max dose of amlodipine, and irbesartan, and max tolerated dose of carvedilol.      Relevant Medications   apixaban (ELIQUIS) 5 MG TABS tablet   carvedilol (COREG) 6.25 MG tablet   Frequent PVCs    On stable dose of carvedilol.  No room to titrate further because of bradycardia.      Relevant Medications   apixaban (ELIQUIS) 5 MG TABS tablet   carvedilol (COREG) 6.25 MG tablet   Other Relevant  Orders   EKG 12-Lead   Comprehensive metabolic panel (Completed)   Hyperlipidemia with target low density lipoprotein (LDL) cholesterol less than 70 mg/dL (Chronic)    Lipid panel ordered for today: LDL went up to 72 from 61 when we reduced his rosuvastatin.  I will asked that he increase back up to 40mg .      Relevant Medications   apixaban (ELIQUIS) 5 MG TABS tablet   carvedilol (COREG) 6.25 MG tablet   Other Relevant Orders   Lipid panel (Completed)   Comprehensive metabolic panel (Completed)   Paroxysmal atrial fibrillation (HCC) (Chronic)    Successful cardioversion back to sinus rhythm.  I suspect that his A. fib may have been related to his MI.  Hopefully we can keep him out of A. fib.  Currently not on any antiarrhythmic agent.  Is only on carvedilol. He is on stable dose of Eliquis with no bleeding.  Due for sleep apnea study now based on Epworth score.      Relevant Medications   apixaban (ELIQUIS) 5 MG TABS tablet   carvedilol (COREG) 6.25 MG tablet      I spent a total of 25 minutes with the patient and chart review. >  50% of the time was spent in direct patient consultation.   Current medicines are reviewed at length with the patient today.  (+/- concerns) none The following changes have been made:  See below  Patient Instructions  Medication Instructions:  CONTINUE CURRENT MEDICATIONS If you need a refill  on your cardiac medications before your next appointment, please call your pharmacy.   made it feel good makes it a point Lab work:  LIPID CMP CBC  If you have labs (blood work) drawn today and your tests are completely normal, you will receive your results only by: Marland Kitchen MyChart Message (if you have MyChart) OR . A paper copy in the mail If you have any lab test that is abnormal or we need to change your treatment, we will call you to review the results.  Testing/Procedures:  SCHEDULE AT [facility] CONTACTED AFTER  YOUR INSURANCE  HAS PRE-CERT THE  PROCEDURE. Your physician has recommended that you have a sleep study.   Follow-Up: At [facility], you and your health needs are our priority.  As part of our continuing mission to provide you with exceptional heart care, we have created designated Provider Care Teams.  These Care Teams include your primary Cardiologist (physician) and Advanced Practice Providers (APPs -  Physician Assistants and Nurse Practitioners) who all work together to provide you with the care you need, when you need it.  . Your physician recommends that you schedule a follow-up appointment in June 2020 [place].  Studies Ordered:   Orders Placed This Encounter  Procedures  . Lipid panel  . Comprehensive metabolic panel  . CBC  . EKG 12-Lead  . Split night study      Glenetta Hew, M.D., M.S. Interventional Cardiologist   Pager # [phone] Phone # [phone] [place], [place] [address]   Thank you for choosing Heartcare at [facility]!!

## 2018-10-23 NOTE — Patient Instructions (Signed)
Medication Instructions:  CONTINUE CURRENT MEDICATIONS If you need a refill on your cardiac medications before your next appointment, please call your pharmacy.   Lab work:  LIPID CMP CBC  If you have labs (blood work) drawn today and your tests are completely normal, you will receive your results only by: Marland Kitchen MyChart Message (if you have MyChart) OR . A paper copy in the mail If you have any lab test that is abnormal or we need to change your treatment, we will call you to review the results.  Testing/Procedures:  SCHEDULE AT [facility] CONTACTED AFTER  YOUR INSURANCE HAS PRE-CERT THE  PROCEDURE. Your physician has recommended that you have a sleep study. This test records several body functions during sleep, including: brain activity, eye movement, oxygen and carbon dioxide blood levels, heart rate and rhythm, breathing rate and rhythm, the flow of air through your mouth and nose, snoring, body muscle movements, and chest and belly movement.   Follow-Up: At [facility], you and your health needs are our priority.  As part of our continuing mission to provide you with exceptional heart care, we have created designated Provider Care Teams.  These Care Teams include your primary Cardiologist (physician) and Advanced Practice Providers (APPs -  Physician Assistants and Nurse Practitioners) who all work together to provide you with the care you need, when you need it.  . Your physician recommends that you schedule a follow-up appointment in June 2020 [place]. .   Any Other Special Instructions Will Be Listed Below (If Applicable).    .Sleep Study  Travis Key will be scheduled for a  Split sleep study. You will leave next morning between 5:00 a.m - 7:00 a.m.  The sleep study consists of a recording of your brain waves (EEG). Breathing, heart rate and rhythm (ECG), oxygen level, eye movement, and leg movement.  The technician will glue or or paste  several electrodes to your scalp, face, chest and legs.  You will have belts around your chest and abdomen to record breathing and a finger clasp to check blood oxygen levels.  A tube at your mouth and nose will detect airflow.  There are no needle sticks or painful procedures of any sort.  You will have your own room, and we will make every effort to attend to your comfort and privacy.  Please prepare for your study by the following steps:   Please avoid coffee, tea, soda, chocolate and other caffeine foods or beverages after 12:00 noon on the day of your sleep study.   You must arrive with clean (no oils), conditioners or make up, and please make sure that you wash your hair to ensure that your hair and scalp are clean, dry and free of any hair extensions on the day of your study.  This will help to get a good reading of study.  Please try not to nap on the day of your study.  Please bring a list of all your medications.  Bring any medications that you might need during the time you are within the laboratory, including insulin, sleeping pills, pain medication and anxiety medications.  Bring snacks, water or juice  Please bring clothes to sleep in and your normal overnight bag.  Please leave valuable at home, as we will not be responsible for any lost items.  If you have any further questions, please feel free to call our office. Thank you  Please call our office 48 in  advance to cancel or reschedule to avoid a $100.00 early cancel or no show fee

## 2018-10-24 LAB — COMPREHENSIVE METABOLIC PANEL
ALBUMIN: 4.3 g/dL (ref 3.5–4.8)
ALT: 12 IU/L (ref 0–44)
AST: 15 IU/L (ref 0–40)
Albumin/Globulin Ratio: 1.4 (ref 1.2–2.2)
Alkaline Phosphatase: 78 IU/L (ref 39–117)
BUN/Creatinine Ratio: 13 (ref 10–24)
BUN: 13 mg/dL (ref 8–27)
Bilirubin Total: 0.6 mg/dL (ref 0.0–1.2)
CO2: 24 mmol/L (ref 20–29)
Calcium: 9.7 mg/dL (ref 8.6–10.2)
Chloride: 100 mmol/L (ref 96–106)
Creatinine, Ser: 1.04 mg/dL (ref 0.76–1.27)
GFR calc Af Amer: 80 mL/min/{1.73_m2} (ref >59)
GFR calc non Af Amer: 69 mL/min/{1.73_m2} (ref >59)
GLUCOSE: 98 mg/dL (ref 65–99)
Globulin, Total: 3 g/dL (ref 1.5–4.5)
Potassium: 4.4 mmol/L (ref 3.5–5.2)
Sodium: 138 mmol/L (ref 134–144)
Total Protein: 7.3 g/dL (ref 6.0–8.5)

## 2018-10-24 LAB — CBC
Hematocrit: 45.3 % (ref 37.5–51.0)
Hemoglobin: 15.3 g/dL (ref 13.0–17.7)
MCH: 31.1 pg (ref 26.6–33.0)
MCHC: 33.8 g/dL (ref 31.5–35.7)
MCV: 92 fL (ref 79–97)
Platelets: 250 10*3/uL (ref 150–450)
RBC: 4.92 x10E6/uL (ref 4.14–5.80)
RDW: 11.9 % (ref 11.6–15.4)
WBC: 7.8 10*3/uL (ref 3.4–10.8)

## 2018-10-24 LAB — LIPID PANEL
Chol/HDL Ratio: 2.8 ratio (ref 0.0–5.0)
Cholesterol, Total: 135 mg/dL (ref 100–199)
HDL: 48 mg/dL (ref >39)
LDL Calculated: 72 mg/dL (ref 0–99)
Triglycerides: 73 mg/dL (ref 0–149)
VLDL Cholesterol Cal: 15 mg/dL (ref 5–40)

## 2018-10-25 ENCOUNTER — Encounter: Payer: Self-pay | Admitting: Cardiology

## 2018-10-25 NOTE — Assessment & Plan Note (Signed)
On stable dose of carvedilol.  No room to titrate further because of bradycardia.

## 2018-10-25 NOTE — Assessment & Plan Note (Signed)
Successful cardioversion back to sinus rhythm.  I suspect that his A. fib may have been related to his MI.  Hopefully we can keep him out of A. fib.  Currently not on any antiarrhythmic agent.  Is only on carvedilol. He is on stable dose of Eliquis with no bleeding.  Due for sleep apnea study now based on Epworth score.

## 2018-10-25 NOTE — Assessment & Plan Note (Signed)
Epworth score of 16. Plan: Sleep study

## 2018-10-25 NOTE — Assessment & Plan Note (Signed)
Initial blood pressure check today was 160/76, but on my recheck was actually more like 136/76.  Is on max dose of amlodipine, and irbesartan, and max tolerated dose of carvedilol.

## 2018-10-25 NOTE — Assessment & Plan Note (Signed)
No angina.  No heart failure. Is on amlodipine, and carvedilol along with irbesartan.    Borderline target at 20 mg rosuvastatin --> with LDL at 72, will ask that he go back to 40 mg.

## 2018-10-25 NOTE — Assessment & Plan Note (Addendum)
Lipid panel ordered for today: LDL went up to 72 from 61 when we reduced his rosuvastatin.  I will asked that he increase back up to 40mg .

## 2018-10-25 NOTE — Assessment & Plan Note (Signed)
With atrial fibrillation on Eliquis, he is now on Plavix without aspirin.  Is on carvedilol and statin.  No recurrent angina following last PCI.  No recurrent anginal symptoms.

## 2018-10-27 ENCOUNTER — Telehealth: Payer: Self-pay | Admitting: *Deleted

## 2018-10-27 DIAGNOSIS — Z9861 Coronary angioplasty status: Secondary | ICD-10-CM

## 2018-10-27 DIAGNOSIS — I2510 Atherosclerotic heart disease of native coronary artery without angina pectoris: Secondary | ICD-10-CM

## 2018-10-27 DIAGNOSIS — E785 Hyperlipidemia, unspecified: Secondary | ICD-10-CM

## 2018-10-27 MED ORDER — ROSUVASTATIN CALCIUM 40 MG PO TABS: 40.0000 mg | ORAL_TABLET | Freq: Every day | ORAL | 3 refills | Status: DC

## 2018-10-27 NOTE — Telephone Encounter (Signed)
-----   Message from Leonie Man, MD sent at 10/25/2018  9:38 AM EST ----- Lipid panel looks pretty good.  Pretty stable, but LDL is up to 72 from 61.  In the setting of recent requirement of new stent, I would like to try to dry the LDL down lower.  Let us continue on the 40 mg of Crestor as opposed to 20 mg.  We can recheck in 3 months.  If still not better, may need to be a little more aggressive to avoid further stents.  Glenetta Hew, MD

## 2018-10-27 NOTE — Telephone Encounter (Signed)
Patient notified of sleep study appointment details.

## 2018-10-27 NOTE — Telephone Encounter (Signed)
The patient has been notified of the result and verbalized understanding.  All questions (if any) were answered. Raiford Simmonds, RN 10/27/2018 6:23 PM   [facility] WILL MAIL LABSLIP IN 3 MONTHS  PATIENT WILL RETURN IN TAKING 40 MG ROSUVASTATIN . MEDICATION LIST ADJUSTED

## 2018-10-27 NOTE — Telephone Encounter (Signed)
-----   Message from Raiford Simmonds, RN sent at 10/23/2018  9:48 AM EST ----- Needs a spilt sleep study Epiworth score 16 Daytime sleepiness  Thanks sharon

## 2018-11-26 ENCOUNTER — Encounter (HOSPITAL_BASED_OUTPATIENT_CLINIC_OR_DEPARTMENT_OTHER): Payer: Medicare Other

## 2018-12-03 ENCOUNTER — Ambulatory Visit (HOSPITAL_BASED_OUTPATIENT_CLINIC_OR_DEPARTMENT_OTHER): Payer: Medicare Other | Attending: Cardiology | Admitting: Cardiovascular Disease

## 2018-12-03 VITALS — Ht 71.0 in | Wt 200.0 lb

## 2018-12-03 DIAGNOSIS — G4730 Sleep apnea, unspecified: Secondary | ICD-10-CM

## 2018-12-03 DIAGNOSIS — R400 Somnolence: Secondary | ICD-10-CM | POA: Insufficient documentation

## 2018-12-03 DIAGNOSIS — I2510 Atherosclerotic heart disease of native coronary artery without angina pectoris: Secondary | ICD-10-CM | POA: Insufficient documentation

## 2018-12-03 DIAGNOSIS — R008 Other abnormalities of heart beat: Secondary | ICD-10-CM | POA: Diagnosis not present

## 2018-12-03 DIAGNOSIS — Z9861 Coronary angioplasty status: Secondary | ICD-10-CM | POA: Diagnosis not present

## 2018-12-03 DIAGNOSIS — Z79899 Other long term (current) drug therapy: Secondary | ICD-10-CM | POA: Insufficient documentation

## 2018-12-03 DIAGNOSIS — R0902 Hypoxemia: Secondary | ICD-10-CM | POA: Insufficient documentation

## 2018-12-03 DIAGNOSIS — Z7901 Long term (current) use of anticoagulants: Secondary | ICD-10-CM | POA: Insufficient documentation

## 2018-12-03 DIAGNOSIS — I493 Ventricular premature depolarization: Secondary | ICD-10-CM | POA: Insufficient documentation

## 2018-12-03 DIAGNOSIS — G4736 Sleep related hypoventilation in conditions classified elsewhere: Secondary | ICD-10-CM | POA: Diagnosis not present

## 2018-12-06 ENCOUNTER — Encounter (HOSPITAL_BASED_OUTPATIENT_CLINIC_OR_DEPARTMENT_OTHER): Payer: Self-pay | Admitting: Cardiovascular Disease

## 2018-12-06 NOTE — Procedures (Signed)
Patient Name: Travis Key, Chura Date: 12/03/2018 Gender: Male D.O.B: [birth year] Age (years): 77 Referring Provider: Glenetta Hew Height (inches): 71 Interpreting Physician: Shelva Majestic MD, ABSM Weight (lbs): 200 RPSGT: Laren Everts BMI: 28 [name]: [id number] Neck Size: 16.50  CLINICAL INFORMATION Sleep Study Type: NPSG  Indication for sleep study: Excessive Daytime Sleepiness, Fatigue, Hypertension, Morning Headaches, Snoring  Epworth Sleepiness Score: 18  SLEEP STUDY TECHNIQUE As per the AASM Manual for the Scoring of Sleep and Associated Events v2.3 (April 2016) with a hypopnea requiring 4% desaturations.  The channels recorded and monitored were frontal, central and occipital EEG, electrooculogram (EOG), submentalis EMG (chin), nasal and oral airflow, thoracic and abdominal wall motion, anterior tibialis EMG, snore microphone, electrocardiogram, and pulse oximetry.  MEDICATIONS amLODipine (NORVASC) 10 MG tablet (Expired) apixaban (ELIQUIS) 5 MG TABS tablet carvedilol (COREG) 6.25 MG tablet clopidogrel (PLAVIX) 75 MG tablet irbesartan (AVAPRO) 300 MG tablet nitroGLYCERIN (NITROSTAT) 0.4 MG SL tablet rosuvastatin (CRESTOR) 40 MG tablet tetrahydrozoline 0.05 % ophthalmic solution  Medications self-administered by patient taken the night of the study : AVAPRO, CRESTOR, CARVEDILOL, Eliquis, AMLODIPINE  SLEEP ARCHITECTURE The study was initiated at 10:03:20 PM and ended at 4:20:12 AM.  Sleep onset time was 313.3 minutes and the sleep efficiency was 1.7%%. The total sleep time was 6.5 minutes.  Stage REM latency was N/A minutes.  The patient spent 38.5%% of the night in stage N1 sleep, 61.5%% in stage N2 sleep, 0.0%% in stage N3 and 0% in REM.  Alpha intrusion was absent.  Supine sleep was 0.00%.  RESPIRATORY PARAMETERS This was an inadequeate sleep evaluation with severely prolonged sleep latency at 313.3 minutes. Total sleep time was 6.5 minutes out of a  sleep period time of 63.6 minutes giving a sleep eficiency of only 1.7%. The overall apnea/hypopnea index (AHI) was 0.0 per hour with a significantly elevated respiratory disturbance time (RDI of 46.2/h. There were 0 total apneas, including 0 obstructive, 0 central and 0 mixed apneas. There were 0 hypopneas and 5 RERAs.  Absence of REM sleep.  Absence of supine sleep.  The mean oxygen saturation was 91.7%. The minimum SpO2 during sleep was 89.0%.  No snoring was noted during this study.  CARDIAC DATA The 2 lead EKG demonstrated sinus rhythm. The mean heart rate was 46.6 beats per minute. Other EKG findings include: PVCs.  LEG MOVEMENT DATA The total PLMS were 0 with a resulting PLMS index of 0.0. Associated arousal with leg movement index was 0.0 .  IMPRESSIONS - Inadequate polysomnogram due to inadequate sleep. - No significant central sleep apnea occurred during this study (CAI 0.0/h). - The patient had minimal or no oxygen desaturation during the study (Min O2 89.0%) - No snoring was audible during this study. - EKG findings include PVCs with transient bigeminy. - Clinically significant periodic limb movements did not occur during sleep. No significant associated arousals.  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 - Efforts should be made to optimize nasal and oropharyngeal patency. - Avoid alcohol, sedatives and other CNS depressants that may worsen sleep apnea and disrupt normal sleep architecture. - Sleep hygiene should be reviewed to assess factors that may improve duration and sleep quality. - Weight management and regular exercise should be initiated or continued if appropriate.  [Electronically signed] 12/06/2018 11:48 AM  Shelva Majestic MD, [facility],  ABSM Diplomate,  American Board of Sleep Medicine   NPI: [id number] [place] PH: [id number]   FX: [phone] [place]

## 2018-12-07 ENCOUNTER — Other Ambulatory Visit: Payer: Self-pay | Admitting: Cardiovascular Disease

## 2018-12-07 ENCOUNTER — Telehealth: Payer: Self-pay | Admitting: *Deleted

## 2018-12-07 DIAGNOSIS — I10 Essential (primary) hypertension: Secondary | ICD-10-CM

## 2018-12-07 DIAGNOSIS — R400 Somnolence: Secondary | ICD-10-CM

## 2018-12-07 DIAGNOSIS — I2510 Atherosclerotic heart disease of native coronary artery without angina pectoris: Secondary | ICD-10-CM

## 2018-12-07 NOTE — Telephone Encounter (Signed)
Please have patient take zolpidem 5 mg the night of his  repeat sleep evaluation

## 2018-12-07 NOTE — Progress Notes (Signed)
Patient notified of sleep study results and recommendations. 

## 2018-12-07 NOTE — Telephone Encounter (Signed)
Patient notified that he did not sleep and he agrees. He has also agreed to try to do another sleep study using a sleep aid. He has been scheduled for March 29th. Message will be forwarded to Dr Claiborne Billings for sleep RX

## 2018-12-07 NOTE — Telephone Encounter (Signed)
-----   Message from Troy Sine, MD sent at 12/06/2018 11:52 AM EST ----- Mariann Laster, please notify pt and try to arrange for another study with sleep aid

## 2018-12-08 ENCOUNTER — Other Ambulatory Visit: Payer: Self-pay | Admitting: Cardiovascular Disease

## 2018-12-08 DIAGNOSIS — R400 Somnolence: Secondary | ICD-10-CM

## 2018-12-08 MED ORDER — ZOLPIDEM TARTRATE 5 MG PO TABS: 5.0000 mg | ORAL_TABLET | Freq: Once | ORAL | Status: AC

## 2018-12-08 NOTE — Telephone Encounter (Signed)
Zolpidem 5 mg phoned to [facility] for patient to take the night of his sleep study. Pharmacist asked to place RX on hold. Patient informed to pick prescription up on 01/09/19.

## 2018-12-08 NOTE — Telephone Encounter (Signed)
Zolpidem RX will be sent to [place] [facility].

## 2018-12-25 ENCOUNTER — Telehealth: Payer: Self-pay | Admitting: Cardiology

## 2018-12-25 NOTE — Telephone Encounter (Signed)
  Patient is c/o not being able to sleep. He has a sleep study on 01/10/19. He feels like his stress level is keeping him up and he would like to see if Dr Ellyn Hack could give him something to help him sleep.

## 2018-12-26 NOTE — Telephone Encounter (Signed)
This is usually something that I would defer to PCP to treat.  Not comfortable prescribing sleep aid medications  Glenetta Hew, MD

## 2018-12-28 NOTE — Telephone Encounter (Signed)
The patient has been notified of the result and verbalized understanding.  All questions (if any) were answered. Raiford Simmonds, RN 12/28/2018 3:22 PM   Patient states he has  insomnia but he slept last night. He verbalized understanding.

## 2019-01-10 ENCOUNTER — Encounter (HOSPITAL_BASED_OUTPATIENT_CLINIC_OR_DEPARTMENT_OTHER): Payer: Medicare Other

## 2019-01-26 ENCOUNTER — Other Ambulatory Visit: Payer: Self-pay | Admitting: *Deleted

## 2019-01-26 ENCOUNTER — Telehealth: Payer: Self-pay | Admitting: *Deleted

## 2019-01-26 DIAGNOSIS — Z9861 Coronary angioplasty status: Secondary | ICD-10-CM

## 2019-01-26 DIAGNOSIS — I2510 Atherosclerotic heart disease of native coronary artery without angina pectoris: Secondary | ICD-10-CM

## 2019-01-26 DIAGNOSIS — E785 Hyperlipidemia, unspecified: Secondary | ICD-10-CM

## 2019-01-26 NOTE — Telephone Encounter (Signed)
Mail letter and labslip  

## 2019-01-26 NOTE — Telephone Encounter (Signed)
-----   Message from Raiford Simmonds, RN sent at 10/27/2018  6:26 PM EST ----- WILL MAIL LABSLIP  AND LETTER @3 /13/20   DUE 01/26/19 LIPID , HEPATIC PANEL

## 2019-02-21 ENCOUNTER — Encounter (HOSPITAL_BASED_OUTPATIENT_CLINIC_OR_DEPARTMENT_OTHER): Payer: Medicare Other

## 2019-02-23 DIAGNOSIS — E669 Obesity, unspecified: Secondary | ICD-10-CM | POA: Diagnosis not present

## 2019-02-23 DIAGNOSIS — E785 Hyperlipidemia, unspecified: Secondary | ICD-10-CM | POA: Diagnosis not present

## 2019-02-23 DIAGNOSIS — I119 Hypertensive heart disease without heart failure: Secondary | ICD-10-CM | POA: Diagnosis not present

## 2019-02-23 DIAGNOSIS — Z79899 Other long term (current) drug therapy: Secondary | ICD-10-CM | POA: Diagnosis not present

## 2019-02-23 DIAGNOSIS — I10 Essential (primary) hypertension: Secondary | ICD-10-CM | POA: Diagnosis not present

## 2019-02-23 DIAGNOSIS — I252 Old myocardial infarction: Secondary | ICD-10-CM | POA: Diagnosis not present

## 2019-02-23 DIAGNOSIS — I2510 Atherosclerotic heart disease of native coronary artery without angina pectoris: Secondary | ICD-10-CM | POA: Diagnosis not present

## 2019-02-23 DIAGNOSIS — Z9861 Coronary angioplasty status: Secondary | ICD-10-CM | POA: Diagnosis not present

## 2019-02-23 LAB — HEPATIC FUNCTION PANEL
ALT: 31 IU/L (ref 0–44)
AST: 29 IU/L (ref 0–40)
Albumin: 4.6 g/dL (ref 3.7–4.7)
Alkaline Phosphatase: 83 IU/L (ref 39–117)
Bilirubin Total: 0.6 mg/dL (ref 0.0–1.2)
Bilirubin, Direct: 0.19 mg/dL (ref 0.00–0.40)
Total Protein: 7.9 g/dL (ref 6.0–8.5)

## 2019-02-23 LAB — LIPID PANEL
Chol/HDL Ratio: 2.6 ratio (ref 0.0–5.0)
Cholesterol, Total: 139 mg/dL (ref 100–199)
HDL: 54 mg/dL (ref >39)
LDL Calculated: 73 mg/dL (ref 0–99)
Triglycerides: 61 mg/dL (ref 0–149)
VLDL Cholesterol Cal: 12 mg/dL (ref 5–40)

## 2019-04-20 ENCOUNTER — Encounter (HOSPITAL_BASED_OUTPATIENT_CLINIC_OR_DEPARTMENT_OTHER): Payer: Medicare Other

## 2019-07-01 DIAGNOSIS — I10 Essential (primary) hypertension: Secondary | ICD-10-CM | POA: Diagnosis not present

## 2019-07-01 DIAGNOSIS — I119 Hypertensive heart disease without heart failure: Secondary | ICD-10-CM | POA: Diagnosis not present

## 2019-07-01 DIAGNOSIS — Z79899 Other long term (current) drug therapy: Secondary | ICD-10-CM | POA: Diagnosis not present

## 2019-08-20 ENCOUNTER — Other Ambulatory Visit: Payer: Self-pay | Admitting: Cardiology

## 2019-08-20 NOTE — Telephone Encounter (Signed)
Rx request sent to pharmacy.  

## 2019-11-08 ENCOUNTER — Ambulatory Visit: Payer: Medicare Other | Attending: Internal Medicine

## 2019-11-08 DIAGNOSIS — Z23 Encounter for immunization: Secondary | ICD-10-CM | POA: Insufficient documentation

## 2019-11-08 NOTE — Progress Notes (Signed)
   Covid-19 Vaccination Clinic  Name:  Travis Key    [name]: [id number] DOB: Nov 14, 1940  11/08/2019  Travis Key was observed post Covid-19 immunization for 15 minutes without incidence. He was provided with Vaccine Information Sheet and instruction to access the V-Safe system.   Travis Key was instructed to call 911 with any severe reactions post vaccine: Marland Kitchen Difficulty breathing  . Swelling of your face and throat  . A fast heartbeat  . A bad rash all over your body  . Dizziness and weakness    Immunizations Administered    Name Date Dose VIS Date Route   Pfizer COVID-19 Vaccine 11/08/2019 10:49 AM 0.3 mL 09/24/2019 Intramuscular   Manufacturer: [place]   Lot: [id number]   [place]: [id number]

## 2019-11-29 ENCOUNTER — Ambulatory Visit: Payer: Medicare Other | Attending: Internal Medicine

## 2019-11-29 DIAGNOSIS — Z23 Encounter for immunization: Secondary | ICD-10-CM | POA: Insufficient documentation

## 2019-11-29 NOTE — Progress Notes (Signed)
   Covid-19 Vaccination Clinic  Name:  Travis Key    [name]: [id number] DOB: 03/03/1941  11/29/2019  [name] was observed post Covid-19 immunization for 15 minutes without incidence. He was provided with Vaccine Information Sheet and instruction to access the V-Safe system.   [name] was instructed to call 911 with any severe reactions post vaccine: Marland Kitchen Difficulty breathing  . Swelling of your face and throat  . A fast heartbeat  . A bad rash all over your body  . Dizziness and weakness    Immunizations Administered    Name Date Dose VIS Date Route   Pfizer COVID-19 Vaccine 11/29/2019 10:40 AM 0.3 mL 09/24/2019 Intramuscular   Manufacturer: [place]   Lot: [id number]   [place]: [id number]

## 2020-01-17 ENCOUNTER — Telehealth: Payer: Self-pay | Admitting: Cardiology

## 2020-01-17 NOTE — Telephone Encounter (Signed)
Follow up  ° ° °Patient is returning call.  °

## 2020-01-17 NOTE — Telephone Encounter (Signed)
Contacted patient, LVM to call back . Left call back number.

## 2020-01-17 NOTE — Telephone Encounter (Signed)
Contacted patient back- he states that he can not afford this medication- he is on Medicare and they do not except copay cards- patient states he was told he would only have to be on this for about 2 years, and he states it has been over that 2 year [place]- he wants to know if he can come off it, or can be switched to something else.   He still has the medication to take for the next few days but will be out soon.   Patient advised I would route a message to MD to advise.

## 2020-01-17 NOTE — Telephone Encounter (Signed)
New message  Pt c/o medication issue:  1. Name of Medication: apixaban (ELIQUIS) 5 MG TABS tablet  2. How are you currently taking this medication (dosage and times per day)? As directed  3. Are you having a reaction (difficulty breathing--STAT)? No  4. What is your medication issue? Patient states that he needs to try a different medication because he now being charged $547 to refill the Eliquis. Patient states that he can not afford this medication at this price. Patient also would like to get a recommendation from the Doctor on a different insurance company to consult other than [employer]. Please give patient a call back to assist.

## 2020-01-17 NOTE — Telephone Encounter (Signed)
The Eliquis anticoagulation because of history of A. fib.  Is this occurred during his MI timeframe, but cannot be sure if he is having asymptomatic episodes.  It could be that Medicare covers Xarelto or Pradaxa as opposed to Eliquis.  If that is the case, then we can switch.  It is okay if he misses a few days during the transition.  I think the original plan was that he would only stay on Plavix for 2years before stopping but to continue Eliquis (or alternative)  .  Glenetta Hew, MD

## 2020-01-18 MED ORDER — APIXABAN 5 MG PO TABS: 5.0000 mg | ORAL_TABLET | Freq: Two times a day (BID) | ORAL | 3 refills | Status: DC

## 2020-01-18 NOTE — Telephone Encounter (Signed)
Spoke to patient . information given .   - patient will contact  Insurance to see what is on a lower tier and price.    patient states his Eliquis will cost him $85     RN discussed option  - 1  - can change  Medication if needed 2- can send patient assistance for Eliquis 3-  Patient will pick samples 2 BOXES   If needed , he has @ 10 days left.

## 2020-01-18 NOTE — Telephone Encounter (Signed)
Called back - he states that all the medication care  Tier 3 or tier 4 .  Patient states the medication is expensive because of his deductible for the year. Patient states he received the information from the rep at [facility].  patient states he will come pick samples and patient assistance  Form . Today  Patient is aware to bring back to office to be faxed. Marland Kitchen

## 2020-03-25 NOTE — Progress Notes (Signed)
Cardiology Office Note   Date:  03/27/2020   ID:  Travis Key, DOB 03-Sep-1941, [name] [id number]  PCP:  [name], FNP  Cardiologist:  Dr. Ellyn Hack CC: Follow Up    History of Present Illness: Travis Key is a 79 y.o. male who presents for ongoing assessment and management of paroxysmal atrial fibrillation, status post DCCV, 2020, CAD (STEMI in 2018 with PCI to LAD in the setting of unstable angina, PCI to the circumflex and November 2019, and medical treatment for chronic total occlusion of the RCA), hyperlipidemia with goal of LDL less than 70.Marland Kitchen  He was last seen by Dr. Ellyn Hack on 10/23/2018.  Plan was to keep him on Plavix for 2 years (until Nov 2021), and continue Eliquis.  He was to remain on rosuvastatin 40 mg daily.   He comes today without any complaints of chest pain, dyspnea on exertion, palpitations, dizziness, or fatigue.  He is very active.  He walks 3 to 4 miles a day and also does a lot of yard work, and work around his home.  He is retired [employer] administrator.  He has been medically compliant.  Sees his PCP regularly.  Past Medical History:  Diagnosis Date  . Adenomatous polyp of colon 08/2003  . Anal fissure   . CAD S/P LAD PCI for Antero-Posterior STEMI 09/2017; 08/2018   a) 12/18 STEMI PCI/DESx1 (Xience Sierra 4.0 x 38 - 4.6 mm) mLAD, CTO of RCA, normal EF; b) 90% m-dCx (DES PCI - Xience Sierra 3.5 x 15).  patent LAD stent, stable known non-dom RCA CTO.  . Diverticulosis   . High cholesterol   . History of myocardial infarction: Anterior - posterior MI (LAD lesion - PCI) 09/14/2017   a) Ant STEMI: STENT SIERRA 4.00 X 38 MM. --Postdilated to 4.6 mm p-m LAD;; b)  NSTEMI 08/2018 - STENT SIERRA 3.5 X 15 MM) m-dCx  . Hypertension   . Internal hemorrhoids     Past Surgical History:  Procedure Laterality Date  . CARDIOVERSION N/A 09/28/2018   Procedure: CARDIOVERSION;  Surgeon: Larey Dresser, MD;  Location: [facility] ENDOSCOPY;  Service: Cardiovascular;  Laterality: N/A;  .  CORONARY STENT INTERVENTION N/A 09/14/2017   Procedure: CORONARY STENT INTERVENTION;  Surgeon: Leonie Man, MD;  Location: [place] CV LAB;  Service: Cardiovascular:: p-mLAD 95% (tandem lesions 80&95%) --> PCI with 2 overlapping Xience [place] DES (distal 4.0 x 38 -> prox 4.0 x 12 --> post-dilated to ~4.6 mm.], 0% residual  . CORONARY STENT INTERVENTION N/A 09/04/2018   Procedure: CORONARY STENT INTERVENTION;  Surgeon: Martinique, Peter M, MD;  Location: [place] CV LAB;  Service: Cardiovascular: m-dCx 90% (DES PCI Xience Sierra 3.5 x 15 -- 3.8 mm)  . CORONARY/GRAFT ACUTE MI REVASCULARIZATION N/A 09/14/2017   Procedure: Coronary/Graft Acute MI Revascularization;  Surgeon: Leonie Man, MD;  Location: [place] CV LAB;  Service: Cardiovascular;  Laterality: N/A;  . LEFT HEART CATH AND CORONARY ANGIOGRAPHY N/A 09/14/2017   Procedure: LEFT HEART CATH AND CORONARY ANGIOGRAPHY;  Surgeon: Leonie Man, MD;  Location: [place] CV LAB;  p-m LAD 85-95% tandem lesions (PCI), 0% residual.  OstD2 ~50%, m-dCx 60%, LPL1 ~50%. Small Non-dom RCA 100% CTO ~mid.  EF ~45-50% with apical anterior, apical & inferoapical HK.  Marland Kitchen LEFT HEART CATH AND CORONARY ANGIOGRAPHY N/A 09/04/2018   Procedure: LEFT HEART CATH AND CORONARY ANGIOGRAPHY;  Surgeon: Martinique, Peter M, MD;  Location: [place] CV LAB;  Service: Cardiovascular: m-dCx 90% (DES PCI). Patent  p-mLAD (STENT SIERRA 4.00 X 38 MM. --Postdilated to 4.6 mm) stent with stable 50% ost D2  (jailed). known CTO of pRCA with R-R collaterals (non-dominant).    . ROTATOR CUFF REPAIR Right   . TRANSTHORACIC ECHOCARDIOGRAM  09/2017; 08/2018   a) In setting of anterior STEMI: EF 50 and 55%.  Moderate LVH.  Moderate HK of mid-apical anteroseptal wall.  Mild aortic valve calcification.;; b) EF up to 55-60%.  No R WMA.  Unable to assess diastolic function because of atrial fibrillation.  Mild LA dilation.  Trivial MR.   Marland Kitchen UMBILICAL HERNIA REPAIR       Current  Outpatient Medications  Medication Sig Dispense Refill  . amLODipine (NORVASC) 10 MG tablet Take 1 tablet (10 mg total) by mouth daily. 90 tablet 3  . apixaban (ELIQUIS) 5 MG TABS tablet Take 1 tablet (5 mg total) by mouth 2 (two) times daily. 180 tablet 3  . carvedilol (COREG) 6.25 MG tablet Take 1 tablet (6.25 mg total) by mouth 2 (two) times daily. 180 tablet 3  . clopidogrel (PLAVIX) 75 MG tablet TAKE 1 TABLET BY MOUTH DAILY WITH BREAKFAST. 90 tablet 3  . irbesartan (AVAPRO) 300 MG tablet Take 300 mg by mouth daily.    . nitroGLYCERIN (NITROSTAT) 0.4 MG SL tablet Place 1 tablet (0.4 mg total) under the tongue every 5 (five) minutes as needed. 25 tablet 2  . rosuvastatin (CRESTOR) 40 MG tablet Take 1 tablet (40 mg total) by mouth at bedtime. 90 tablet 3   Current Facility-Administered Medications  Medication Dose Route Frequency Provider Last Rate Last Admin  . zolpidem (AMBIEN) tablet 5 mg  5 mg Oral Once Troy Sine, MD        Allergies:   Patient has no known allergies.    Social History:  The patient  reports that he quit smoking about 35 years ago. His smoking use included cigarettes. He has never used smokeless tobacco. He reports that he does not drink alcohol and does not use drugs.   Family History:  The patient's family history includes Diabetes in his mother; Liver disease in his mother.    ROS: All other systems are reviewed and negative. Unless otherwise mentioned in H&P    PHYSICAL EXAM: VS:  BP (!) 146/82   Pulse (!) 54   Ht 5' 10.5" (1.791 m)   Wt 213 lb 12.8 oz (97 kg)   SpO2 96%   BMI 30.24 kg/m  , BMI Body mass index is 30.24 kg/m. GEN: Well nourished, well developed, in no acute distress HEENT: normal Neck: no JVD, carotid bruits, or masses Cardiac: RRR; 1/6 systolic murmur heard best at the right sternal border in the upright position, not on supine position, no rubs, or gallops,no edema  Respiratory:  Clear to auscultation bilaterally, normal work  of breathing GI: soft, nontender, nondistended, + BS MS: no deformity or atrophy Skin: warm and dry, no rash Neuro:  Strength and sensation are intact Psych: euthymic mood, full affect   EKG: Sinus bradycardia heart rate of 54 bpm, otherwise normal.  (Personally reviewed)  Recent Labs: No results found for requested labs within last 8760 hours.    Lipid Panel    Component Value Date/Time   CHOL 139 02/23/2019 0941   TRIG 61 02/23/2019 0941   HDL 54 02/23/2019 0941   CHOLHDL 2.6 02/23/2019 0941   CHOLHDL 3.4 09/15/2017 0012   VLDL 18 09/15/2017 0012   LDLCALC 73 02/23/2019 0941  Wt Readings from Last 3 Encounters:  03/27/20 213 lb 12.8 oz (97 kg)  12/03/18 200 lb (90.7 kg)  10/23/18 220 lb (99.8 kg)      Other studies Reviewed: (Copied from Dr.Harding's note for accuracy). Recent Hospitalizations:   Admitted for non-STEMI November 22-23 2019 -PCI to distal circumflex.  Successful DCCV/cardioversion 09/28/2018  Studies Personally Reviewed - (if available, images/films reviewed: From Epic Chart or Care Everywhere)  CARDIAC CATH-PCI 09/04/2018: m-dCx 90% (DES PCI Xience Sierra 3.5 x 15 -- 3.8 mm), patent p-mLAD (STENT SIERRA 4.00 X 38 MM. --Postdilated to 4.6 mm) stent with stable 50% ost D2  (jailed). known CTO of pRCA with R-R collaterals (non-dominant).        TTE 09/05/2018: EF up to 55-60%.  No R WMA.  Unable to assess diastolic function because of atrial fibrillation.  Mild LA dilation.  Trivial MR.   DCCV 09/28/2018    ASSESSMENT AND PLAN:  1.  Coronary artery disease: He is without any complaints.  We will continue Plavix, with discussion to discontinue in November 2021 as it has been 2 years.  He will continue secondary prevention with low-cholesterol diet, exercise, and statin therapy.  2.  Paroxysmal atrial fibrillation: He is in normal sinus rhythm per EKG today.  He remains on apixaban 5 mg twice daily.  He does complain of the cost of  apixaban when he has not yet met his deductible of close to $500.  After he meets his deductible is $129 for 76-monthsupply.  We are giving him some samples to help him so that he can save up for his deductible.  He denies any bleeding.  3.  Hypertension: Blood pressure is not optimal for patient with CAD.  He has just taken his medicine prior to coming to this appointment.  Would prefer blood pressure to be in the 130s over 70s.  We will monitor this.  May need to adjust medications or add medications if patient's blood pressure is persistently over 1948systolic.  Given his age we may allow for permissive hypertension.  4.  Hypercholesterolemia: Remains on atorvastatin 40 mg daily.  Goal of LDL less than 70 with HDL greater than 45.  Labs are being drawn this morning along with, CMET and CBC.    Current medicines are reviewed at length with the patient today.  I have spent 30 minutes dedicated to the care of this patient on the date of this encounter to include pre-visit review of records, assessment, management and diagnostic testing,with shared decision making.  Labs/ tests ordered today include: CMET, lipids, CBC.  KPhill Myron LWest Pugh ANP, [facility]  03/27/2020 8:39 AM    [facility] [address] Office ([phone] ([phone] Notice: This dictation was prepared with Dragon dictation along with smaller phrase technology. Any transcriptional errors that result from this process are unintentional and may not be corrected upon review.

## 2020-03-27 ENCOUNTER — Encounter: Payer: Self-pay | Admitting: Adult Health

## 2020-03-27 ENCOUNTER — Ambulatory Visit (INDEPENDENT_AMBULATORY_CARE_PROVIDER_SITE_OTHER): Payer: Medicare Other | Admitting: Adult Health

## 2020-03-27 ENCOUNTER — Other Ambulatory Visit: Payer: Self-pay

## 2020-03-27 VITALS — BP 146/82 | HR 54 | Ht 70.5 in | Wt 213.8 lb

## 2020-03-27 DIAGNOSIS — I10 Essential (primary) hypertension: Secondary | ICD-10-CM

## 2020-03-27 DIAGNOSIS — E785 Hyperlipidemia, unspecified: Secondary | ICD-10-CM | POA: Diagnosis not present

## 2020-03-27 DIAGNOSIS — I480 Paroxysmal atrial fibrillation: Secondary | ICD-10-CM | POA: Diagnosis not present

## 2020-03-27 DIAGNOSIS — I200 Unstable angina: Secondary | ICD-10-CM

## 2020-03-27 DIAGNOSIS — I2510 Atherosclerotic heart disease of native coronary artery without angina pectoris: Secondary | ICD-10-CM | POA: Diagnosis not present

## 2020-03-27 LAB — COMPREHENSIVE METABOLIC PANEL
ALT: 12 IU/L (ref 0–44)
AST: 23 IU/L (ref 0–40)
Albumin/Globulin Ratio: 1.5 (ref 1.2–2.2)
Albumin: 4.3 g/dL (ref 3.7–4.7)
Alkaline Phosphatase: 75 IU/L (ref 48–121)
BUN/Creatinine Ratio: 14 (ref 10–24)
BUN: 17 mg/dL (ref 8–27)
Bilirubin Total: 0.5 mg/dL (ref 0.0–1.2)
CO2: 21 mmol/L (ref 20–29)
Calcium: 9.3 mg/dL (ref 8.6–10.2)
Chloride: 103 mmol/L (ref 96–106)
Creatinine, Ser: 1.22 mg/dL (ref 0.76–1.27)
GFR calc Af Amer: 65 mL/min/{1.73_m2} (ref >59)
GFR calc non Af Amer: 56 mL/min/{1.73_m2} — ABNORMAL LOW (ref >59)
Globulin, Total: 2.9 g/dL (ref 1.5–4.5)
Glucose: 98 mg/dL (ref 65–99)
Potassium: 4.8 mmol/L (ref 3.5–5.2)
Sodium: 139 mmol/L (ref 134–144)
Total Protein: 7.2 g/dL (ref 6.0–8.5)

## 2020-03-27 LAB — CBC
Hematocrit: 41.5 % (ref 37.5–51.0)
Hemoglobin: 13.9 g/dL (ref 13.0–17.7)
MCH: 31.1 pg (ref 26.6–33.0)
MCHC: 33.5 g/dL (ref 31.5–35.7)
MCV: 93 fL (ref 79–97)
Platelets: 249 10*3/uL (ref 150–450)
RBC: 4.47 x10E6/uL (ref 4.14–5.80)
RDW: 11.7 % (ref 11.6–15.4)
WBC: 7.3 10*3/uL (ref 3.4–10.8)

## 2020-03-27 LAB — LIPID PANEL
Chol/HDL Ratio: 3 ratio (ref 0.0–5.0)
Cholesterol, Total: 133 mg/dL (ref 100–199)
HDL: 45 mg/dL (ref >39)
LDL Chol Calc (NIH): 74 mg/dL (ref 0–99)
Triglycerides: 67 mg/dL (ref 0–149)
VLDL Cholesterol Cal: 14 mg/dL (ref 5–40)

## 2020-03-27 MED ORDER — CARVEDILOL 6.25 MG PO TABS: 6.2500 mg | ORAL_TABLET | Freq: Two times a day (BID) | ORAL | 3 refills | Status: DC

## 2020-03-27 MED ORDER — CLOPIDOGREL BISULFATE 75 MG PO TABS: 75.0000 mg | ORAL_TABLET | Freq: Every day | ORAL | 3 refills | Status: DC

## 2020-03-27 MED ORDER — AMLODIPINE BESYLATE 10 MG PO TABS: 10.0000 mg | ORAL_TABLET | Freq: Every day | ORAL | 3 refills | Status: DC

## 2020-03-27 MED ORDER — ROSUVASTATIN CALCIUM 40 MG PO TABS: 40.0000 mg | ORAL_TABLET | Freq: Every day | ORAL | 3 refills | Status: DC

## 2020-03-27 MED ORDER — APIXABAN 5 MG PO TABS: 5.0000 mg | ORAL_TABLET | Freq: Two times a day (BID) | ORAL | 3 refills | Status: DC

## 2020-03-27 MED ORDER — IRBESARTAN 300 MG PO TABS: 300.0000 mg | ORAL_TABLET | Freq: Every day | ORAL | 3 refills | Status: DC

## 2020-03-27 NOTE — Addendum Note (Signed)
Addended by: Therisa Doyne on: 03/27/2020 08:57 AM   Modules accepted: Orders

## 2020-03-27 NOTE — Patient Instructions (Signed)
Medication Instructions:  Your physician recommends that you continue on your current medications as directed. Please refer to the Current Medication list given to you today.  *If you need a refill on your cardiac medications before your next appointment, please call your pharmacy*   Lab Work: Your physician recommends that you return for lab work today: LIPID, CMET, CBC  If you have labs (blood work) drawn today and your tests are completely normal, you will receive your results only by: Marland Kitchen MyChart Message (if you have MyChart) OR . A paper copy in the mail If you have any lab test that is abnormal or we need to change your treatment, we will call you to review the results.   Follow-Up: At [facility], you and your health needs are our priority.  As part of our continuing mission to provide you with exceptional heart care, we have created designated Provider Care Teams.  These Care Teams include your primary Cardiologist (physician) and Advanced Practice Providers (APPs -  Physician Assistants and Nurse Practitioners) who all work together to provide you with the care you need, when you need it.  We recommend signing up for the patient portal called "MyChart".  Sign up information is provided on this After Visit Summary.  MyChart is used to connect with patients for Virtual Visits (Telemedicine).  Patients are able to view lab/test results, encounter notes, upcoming appointments, etc.  Non-urgent messages can be sent to your provider as well.   To learn more about what you can do with MyChart, go to [email or web address].    Your next appointment:   November  The format for your next appointment:   In Person  Provider:   Glenetta Hew, MD   Other Instructions Please call our office 2 months in advance to schedule your follow-up appointment with Dr. Ellyn Hack.

## 2020-03-29 ENCOUNTER — Telehealth: Payer: Self-pay | Admitting: Adult Health

## 2020-03-29 ENCOUNTER — Telehealth: Payer: Self-pay | Admitting: *Deleted

## 2020-03-29 DIAGNOSIS — E785 Hyperlipidemia, unspecified: Secondary | ICD-10-CM

## 2020-03-29 MED ORDER — EZETIMIBE 10 MG PO TABS
10.0000 mg | ORAL_TABLET | Freq: Every day | ORAL | 11 refills | Status: DC
Start: 2020-03-29 — End: 2020-09-11

## 2020-03-29 NOTE — Telephone Encounter (Signed)
-----   Message from Lendon Colonel, NP sent at 03/27/2020  4:54 PM EDT ----- I have reviewed labs.  LDL is still greater than 70. Will need to add Zetia, 10 mg daily to his regimen.  Repeat fasting Lipids in 6 weeks.

## 2020-03-29 NOTE — Telephone Encounter (Signed)
New Message   Pt is returning call for lab results  Call transferred to [facility]

## 2020-04-13 HISTORY — PX: TRANSTHORACIC ECHOCARDIOGRAM: SHX275

## 2020-04-16 ENCOUNTER — Emergency Department (HOSPITAL_COMMUNITY): Payer: Medicare Other

## 2020-04-16 ENCOUNTER — Inpatient Hospital Stay (HOSPITAL_COMMUNITY)
Admission: EM | Admit: 2020-04-16 | Discharge: 2020-04-20 | DRG: 208 | Disposition: A | Discharge status: Home / Self Care | Payer: Medicare Other | Attending: Family Medicine | Admitting: Family Medicine

## 2020-04-16 ENCOUNTER — Encounter (HOSPITAL_COMMUNITY): Payer: Self-pay | Admitting: *Deleted

## 2020-04-16 DIAGNOSIS — Z01818 Encounter for other preprocedural examination: Secondary | ICD-10-CM

## 2020-04-16 DIAGNOSIS — J690 Pneumonitis due to inhalation of food and vomit: Secondary | ICD-10-CM | POA: Diagnosis not present

## 2020-04-16 DIAGNOSIS — R55 Syncope and collapse: Secondary | ICD-10-CM

## 2020-04-16 DIAGNOSIS — R319 Hematuria, unspecified: Secondary | ICD-10-CM | POA: Diagnosis present

## 2020-04-16 DIAGNOSIS — Z7902 Long term (current) use of antithrombotics/antiplatelets: Secondary | ICD-10-CM | POA: Diagnosis not present

## 2020-04-16 DIAGNOSIS — I495 Sick sinus syndrome: Secondary | ICD-10-CM | POA: Diagnosis present

## 2020-04-16 DIAGNOSIS — Z79899 Other long term (current) drug therapy: Secondary | ICD-10-CM

## 2020-04-16 DIAGNOSIS — J9600 Acute respiratory failure, unspecified whether with hypoxia or hypercapnia: Secondary | ICD-10-CM | POA: Diagnosis present

## 2020-04-16 DIAGNOSIS — I5022 Chronic systolic (congestive) heart failure: Secondary | ICD-10-CM | POA: Diagnosis present

## 2020-04-16 DIAGNOSIS — G9340 Encephalopathy, unspecified: Secondary | ICD-10-CM | POA: Diagnosis not present

## 2020-04-16 DIAGNOSIS — R0689 Other abnormalities of breathing: Secondary | ICD-10-CM | POA: Diagnosis not present

## 2020-04-16 DIAGNOSIS — R001 Bradycardia, unspecified: Secondary | ICD-10-CM | POA: Diagnosis not present

## 2020-04-16 DIAGNOSIS — R404 Transient alteration of awareness: Secondary | ICD-10-CM | POA: Diagnosis not present

## 2020-04-16 DIAGNOSIS — I10 Essential (primary) hypertension: Secondary | ICD-10-CM | POA: Diagnosis not present

## 2020-04-16 DIAGNOSIS — Z7901 Long term (current) use of anticoagulants: Secondary | ICD-10-CM

## 2020-04-16 DIAGNOSIS — Z955 Presence of coronary angioplasty implant and graft: Secondary | ICD-10-CM

## 2020-04-16 DIAGNOSIS — R579 Shock, unspecified: Secondary | ICD-10-CM | POA: Diagnosis present

## 2020-04-16 DIAGNOSIS — N179 Acute kidney failure, unspecified: Secondary | ICD-10-CM | POA: Diagnosis not present

## 2020-04-16 DIAGNOSIS — E874 Mixed disorder of acid-base balance: Secondary | ICD-10-CM | POA: Diagnosis present

## 2020-04-16 DIAGNOSIS — R000 Tachycardia, unspecified: Secondary | ICD-10-CM | POA: Diagnosis not present

## 2020-04-16 DIAGNOSIS — I252 Old myocardial infarction: Secondary | ICD-10-CM

## 2020-04-16 DIAGNOSIS — I959 Hypotension, unspecified: Secondary | ICD-10-CM | POA: Diagnosis present

## 2020-04-16 DIAGNOSIS — I480 Paroxysmal atrial fibrillation: Secondary | ICD-10-CM | POA: Diagnosis present

## 2020-04-16 DIAGNOSIS — E785 Hyperlipidemia, unspecified: Secondary | ICD-10-CM | POA: Diagnosis not present

## 2020-04-16 DIAGNOSIS — I110 Hypertensive heart disease with heart failure: Secondary | ICD-10-CM | POA: Diagnosis not present

## 2020-04-16 DIAGNOSIS — R4182 Altered mental status, unspecified: Secondary | ICD-10-CM | POA: Diagnosis not present

## 2020-04-16 DIAGNOSIS — G92 Toxic encephalopathy: Secondary | ICD-10-CM | POA: Diagnosis not present

## 2020-04-16 DIAGNOSIS — Z20822 Contact with and (suspected) exposure to covid-19: Secondary | ICD-10-CM | POA: Diagnosis present

## 2020-04-16 DIAGNOSIS — R040 Epistaxis: Secondary | ICD-10-CM | POA: Diagnosis not present

## 2020-04-16 DIAGNOSIS — R1110 Vomiting, unspecified: Secondary | ICD-10-CM | POA: Diagnosis not present

## 2020-04-16 DIAGNOSIS — G9341 Metabolic encephalopathy: Secondary | ICD-10-CM | POA: Diagnosis not present

## 2020-04-16 DIAGNOSIS — I2510 Atherosclerotic heart disease of native coronary artery without angina pectoris: Secondary | ICD-10-CM

## 2020-04-16 DIAGNOSIS — J9601 Acute respiratory failure with hypoxia: Principal | ICD-10-CM

## 2020-04-16 DIAGNOSIS — R578 Other shock: Secondary | ICD-10-CM | POA: Diagnosis not present

## 2020-04-16 DIAGNOSIS — R21 Rash and other nonspecific skin eruption: Secondary | ICD-10-CM | POA: Diagnosis not present

## 2020-04-16 DIAGNOSIS — R4020 Unspecified coma: Secondary | ICD-10-CM | POA: Diagnosis not present

## 2020-04-16 DIAGNOSIS — J90 Pleural effusion, not elsewhere classified: Secondary | ICD-10-CM | POA: Diagnosis not present

## 2020-04-16 DIAGNOSIS — Z0189 Encounter for other specified special examinations: Secondary | ICD-10-CM

## 2020-04-16 HISTORY — DX: Paroxysmal atrial fibrillation: I48.0

## 2020-04-16 HISTORY — DX: Atherosclerotic heart disease of native coronary artery without angina pectoris: I25.10

## 2020-04-16 HISTORY — DX: Acute myocardial infarction, unspecified: I21.9

## 2020-04-16 LAB — CBC
HCT: 36.9 % — ABNORMAL LOW (ref 39.0–52.0)
Hemoglobin: 10.5 g/dL — ABNORMAL LOW (ref 13.0–17.0)
MCH: 30.8 pg (ref 26.0–34.0)
MCHC: 28.5 g/dL — ABNORMAL LOW (ref 30.0–36.0)
MCV: 108.2 fL — ABNORMAL HIGH (ref 80.0–100.0)
Platelets: 203 10*3/uL (ref 150–400)
RBC: 3.41 MIL/uL — ABNORMAL LOW (ref 4.22–5.81)
RDW: 12.1 % (ref 11.5–15.5)
WBC: 9.5 10*3/uL (ref 4.0–10.5)
nRBC: 0.3 % — ABNORMAL HIGH (ref 0.0–0.2)

## 2020-04-16 LAB — COMPREHENSIVE METABOLIC PANEL
ALT: 16 U/L (ref 0–44)
AST: 25 U/L (ref 15–41)
Albumin: 2.9 g/dL — ABNORMAL LOW (ref 3.5–5.0)
Alkaline Phosphatase: 44 U/L (ref 38–126)
Anion gap: 11 (ref 5–15)
BUN: 17 mg/dL (ref 8–23)
CO2: 17 mmol/L — ABNORMAL LOW (ref 22–32)
Calcium: 8 mg/dL — ABNORMAL LOW (ref 8.9–10.3)
Chloride: 113 mmol/L — ABNORMAL HIGH (ref 98–111)
Creatinine, Ser: 1.57 mg/dL — ABNORMAL HIGH (ref 0.61–1.24)
GFR calc Af Amer: 48 mL/min — ABNORMAL LOW (ref >60)
GFR calc non Af Amer: 42 mL/min — ABNORMAL LOW (ref >60)
Glucose, Bld: 205 mg/dL — ABNORMAL HIGH (ref 70–99)
Potassium: 3.7 mmol/L (ref 3.5–5.1)
Sodium: 141 mmol/L (ref 135–145)
Total Bilirubin: 0.8 mg/dL (ref 0.3–1.2)
Total Protein: 5.5 g/dL — ABNORMAL LOW (ref 6.5–8.1)

## 2020-04-16 LAB — PROTIME-INR
INR: 1.7 — ABNORMAL HIGH (ref 0.8–1.2)
Prothrombin Time: 19 seconds — ABNORMAL HIGH (ref 11.4–15.2)

## 2020-04-16 LAB — I-STAT ARTERIAL BLOOD GAS, ED
Acid-base deficit: 9 mmol/L — ABNORMAL HIGH (ref 0.0–2.0)
Bicarbonate: 20.4 mmol/L (ref 20.0–28.0)
Calcium, Ion: 1.12 mmol/L — ABNORMAL LOW (ref 1.15–1.40)
HCT: 36 % — ABNORMAL LOW (ref 39.0–52.0)
Hemoglobin: 12.2 g/dL — ABNORMAL LOW (ref 13.0–17.0)
O2 Saturation: 99 %
Patient temperature: 92.3
Potassium: 3.8 mmol/L (ref 3.5–5.1)
Sodium: 143 mmol/L (ref 135–145)
TCO2: 22 mmol/L (ref 22–32)
pCO2 arterial: 48.4 mmHg — ABNORMAL HIGH (ref 32.0–48.0)
pH, Arterial: 7.212 — ABNORMAL LOW (ref 7.350–7.450)
pO2, Arterial: 130 mmHg — ABNORMAL HIGH (ref 83.0–108.0)

## 2020-04-16 LAB — TROPONIN I (HIGH SENSITIVITY)
Troponin I (High Sensitivity): 8 ng/L (ref <18)
Troponin I (High Sensitivity): 34 ng/L — ABNORMAL HIGH (ref <18)

## 2020-04-16 LAB — I-STAT CHEM 8, ED
BUN: 17 mg/dL (ref 8–23)
Calcium, Ion: 0.97 mmol/L — ABNORMAL LOW (ref 1.15–1.40)
Chloride: 111 mmol/L (ref 98–111)
Creatinine, Ser: 1.6 mg/dL — ABNORMAL HIGH (ref 0.61–1.24)
Glucose, Bld: 203 mg/dL — ABNORMAL HIGH (ref 70–99)
HCT: 31 % — ABNORMAL LOW (ref 39.0–52.0)
Hemoglobin: 10.5 g/dL — ABNORMAL LOW (ref 13.0–17.0)
Potassium: 3.5 mmol/L (ref 3.5–5.1)
Sodium: 142 mmol/L (ref 135–145)
TCO2: 19 mmol/L — ABNORMAL LOW (ref 22–32)

## 2020-04-16 LAB — ABO/RH: ABO/RH(D): A POS

## 2020-04-16 LAB — SARS CORONAVIRUS 2 BY RT PCR (HOSPITAL ORDER, PERFORMED IN ~~LOC~~ HOSPITAL LAB): SARS Coronavirus 2: NEGATIVE

## 2020-04-16 LAB — ETHANOL: Alcohol, Ethyl (B): <10 mg/dL (ref <10)

## 2020-04-16 LAB — LACTIC ACID, PLASMA: Lactic Acid, Venous: 3.2 mmol/L (ref 0.5–1.9)

## 2020-04-16 MED ORDER — LACTATED RINGERS IV SOLN: INTRAVENOUS | Status: DC

## 2020-04-16 MED ORDER — SODIUM CHLORIDE 0.9 % IV SOLN
2.0000 g | INTRAVENOUS | Status: DC
  Administered 2020-04-16: 2 g via INTRAVENOUS
  Filled 2020-04-16: qty 20

## 2020-04-16 MED ORDER — PIPERACILLIN-TAZOBACTAM 3.375 G IVPB 30 MIN: 3.3750 g | Freq: Three times a day (TID) | INTRAVENOUS | Status: DC

## 2020-04-16 MED ORDER — ONDANSETRON HCL 4 MG/2ML IJ SOLN: 4.0000 mg | Freq: Four times a day (QID) | INTRAMUSCULAR | Status: DC | PRN

## 2020-04-16 MED ORDER — IPRATROPIUM-ALBUTEROL 0.5-2.5 (3) MG/3ML IN SOLN
3.0000 mL | Freq: Four times a day (QID) | RESPIRATORY_TRACT | Status: DC
  Administered 2020-04-17: 3 mL via RESPIRATORY_TRACT
  Filled 2020-04-16: qty 3

## 2020-04-16 MED ORDER — INSULIN ASPART 100 UNIT/ML ~~LOC~~ SOLN
0.0000 [IU] | SUBCUTANEOUS | Status: DC
  Administered 2020-04-17 – 2020-04-19 (×2): 2 [IU] via SUBCUTANEOUS

## 2020-04-16 MED ORDER — ACETAMINOPHEN 325 MG PO TABS
650.0000 mg | ORAL_TABLET | ORAL | Status: DC | PRN
  Administered 2020-04-17 (×2): 650 mg via ORAL
  Filled 2020-04-16 (×2): qty 2

## 2020-04-16 MED ORDER — IOHEXOL 300 MG/ML  SOLN
100.0000 mL | Freq: Once | INTRAMUSCULAR | Status: AC | PRN
  Administered 2020-04-16: 100 mL via INTRAVENOUS

## 2020-04-16 MED ORDER — SODIUM CHLORIDE 0.9 % IV SOLN
500.0000 mg | INTRAVENOUS | Status: AC
  Administered 2020-04-16 – 2020-04-18 (×3): 500 mg via INTRAVENOUS
  Filled 2020-04-16 (×4): qty 500

## 2020-04-16 MED ORDER — MIDAZOLAM HCL 2 MG/2ML IJ SOLN: 2.0000 mg | Freq: Once | INTRAMUSCULAR | Status: AC

## 2020-04-16 MED ORDER — POLYETHYLENE GLYCOL 3350 17 G PO PACK: 17.0000 g | PACK | Freq: Every day | ORAL | Status: DC | PRN

## 2020-04-16 MED ORDER — FAMOTIDINE IN NACL 20-0.9 MG/50ML-% IV SOLN
20.0000 mg | Freq: Two times a day (BID) | INTRAVENOUS | Status: DC
  Administered 2020-04-17 – 2020-04-20 (×8): 20 mg via INTRAVENOUS
  Filled 2020-04-16 (×8): qty 50

## 2020-04-16 MED ORDER — DOCUSATE SODIUM 100 MG PO CAPS: 100.0000 mg | ORAL_CAPSULE | Freq: Two times a day (BID) | ORAL | Status: DC | PRN

## 2020-04-16 MED ORDER — FENTANYL CITRATE (PF) 100 MCG/2ML IJ SOLN
INTRAMUSCULAR | Status: AC
  Filled 2020-04-16: qty 2

## 2020-04-16 MED ORDER — MIDAZOLAM HCL 2 MG/2ML IJ SOLN
INTRAMUSCULAR | Status: AC
  Administered 2020-04-16: 2 mg via INTRAVENOUS
  Filled 2020-04-16: qty 2

## 2020-04-16 MED ORDER — LACTATED RINGERS IV BOLUS
1000.0000 mL | Freq: Once | INTRAVENOUS | Status: AC
  Administered 2020-04-16: 1000 mL via INTRAVENOUS

## 2020-04-16 MED ORDER — FENTANYL CITRATE (PF) 100 MCG/2ML IJ SOLN
100.0000 ug | INTRAMUSCULAR | Status: DC | PRN
  Administered 2020-04-17: 100 ug via INTRAVENOUS
  Filled 2020-04-16: qty 2

## 2020-04-16 MED ORDER — PIPERACILLIN-TAZOBACTAM 3.375 G IVPB
3.3750 g | Freq: Three times a day (TID) | INTRAVENOUS | Status: DC
  Administered 2020-04-17: 3.375 g via INTRAVENOUS
  Filled 2020-04-16 (×2): qty 50

## 2020-04-16 MED ORDER — PROPOFOL 1000 MG/100ML IV EMUL
5.0000 ug/kg/min | INTRAVENOUS | Status: DC
  Filled 2020-04-16: qty 100
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
## 2020-04-16 NOTE — ED Notes (Signed)
[phone] greg Saner son would like update as soon as one is available
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
## 2020-04-16 NOTE — ED Notes (Signed)
Family in consult A
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
## 2020-04-16 NOTE — ED Notes (Addendum)
Azithro scanned and paused. Blood culture x 1 obtained prior to starting.

## 2020-04-16 NOTE — Consult Note (Signed)
Responded to page, pt unavailable, no family present. Please page again if further need for chaplain services.   Rev. Eloise Levels Chaplain
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
## 2020-04-17 ENCOUNTER — Inpatient Hospital Stay (HOSPITAL_COMMUNITY): Payer: Medicare Other

## 2020-04-17 DIAGNOSIS — I480 Paroxysmal atrial fibrillation: Secondary | ICD-10-CM

## 2020-04-17 DIAGNOSIS — R4182 Altered mental status, unspecified: Secondary | ICD-10-CM

## 2020-04-17 DIAGNOSIS — R001 Bradycardia, unspecified: Secondary | ICD-10-CM

## 2020-04-17 DIAGNOSIS — N179 Acute kidney failure, unspecified: Secondary | ICD-10-CM

## 2020-04-17 DIAGNOSIS — R55 Syncope and collapse: Secondary | ICD-10-CM

## 2020-04-17 LAB — BPAM RBC
Blood Product Expiration Date: 202107292359
Blood Product Expiration Date: 202108022359
ISSUE DATE / TIME: 202107041944
ISSUE DATE / TIME: 202107041944
Unit Type and Rh: 5100
Unit Type and Rh: 5100

## 2020-04-17 LAB — BASIC METABOLIC PANEL
Anion gap: 10 (ref 5–15)
BUN: 18 mg/dL (ref 8–23)
CO2: 21 mmol/L — ABNORMAL LOW (ref 22–32)
Calcium: 8.3 mg/dL — ABNORMAL LOW (ref 8.9–10.3)
Chloride: 112 mmol/L — ABNORMAL HIGH (ref 98–111)
Creatinine, Ser: 1.38 mg/dL — ABNORMAL HIGH (ref 0.61–1.24)
GFR calc Af Amer: 56 mL/min — ABNORMAL LOW (ref >60)
GFR calc non Af Amer: 49 mL/min — ABNORMAL LOW (ref >60)
Glucose, Bld: 137 mg/dL — ABNORMAL HIGH (ref 70–99)
Potassium: 3.5 mmol/L (ref 3.5–5.1)
Sodium: 143 mmol/L (ref 135–145)

## 2020-04-17 LAB — URINALYSIS, ROUTINE W REFLEX MICROSCOPIC
Bacteria, UA: NONE SEEN
Bilirubin Urine: NEGATIVE
Glucose, UA: NEGATIVE mg/dL
Ketones, ur: NEGATIVE mg/dL
Leukocytes,Ua: NEGATIVE
Nitrite: NEGATIVE
Protein, ur: NEGATIVE mg/dL
RBC / HPF: >50 RBC/hpf — ABNORMAL HIGH (ref 0–5)
Specific Gravity, Urine: 1.039 — ABNORMAL HIGH (ref 1.005–1.030)
pH: 5 (ref 5.0–8.0)

## 2020-04-17 LAB — TYPE AND SCREEN
ABO/RH(D): A POS
Antibody Screen: NEGATIVE
Unit division: 0
Unit division: 0

## 2020-04-17 LAB — LACTIC ACID, PLASMA: Lactic Acid, Venous: 1.7 mmol/L (ref 0.5–1.9)

## 2020-04-17 LAB — POCT I-STAT 7, (LYTES, BLD GAS, ICA,H+H)
Acid-base deficit: 4 mmol/L — ABNORMAL HIGH (ref 0.0–2.0)
Bicarbonate: 21.6 mmol/L (ref 20.0–28.0)
Calcium, Ion: 1.17 mmol/L (ref 1.15–1.40)
HCT: 39 % (ref 39.0–52.0)
Hemoglobin: 13.3 g/dL (ref 13.0–17.0)
O2 Saturation: 99 %
Patient temperature: 38.5
Potassium: 4.2 mmol/L (ref 3.5–5.1)
Sodium: 143 mmol/L (ref 135–145)
TCO2: 23 mmol/L (ref 22–32)
pCO2 arterial: 42.6 mmHg (ref 32.0–48.0)
pH, Arterial: 7.32 — ABNORMAL LOW (ref 7.350–7.450)
pO2, Arterial: 178 mmHg — ABNORMAL HIGH (ref 83.0–108.0)

## 2020-04-17 LAB — GLUCOSE, CAPILLARY
Glucose-Capillary: 142 mg/dL — ABNORMAL HIGH (ref 70–99)
Glucose-Capillary: 99 mg/dL (ref 70–99)
Glucose-Capillary: 108 mg/dL — ABNORMAL HIGH (ref 70–99)
Glucose-Capillary: 104 mg/dL — ABNORMAL HIGH (ref 70–99)
Glucose-Capillary: 108 mg/dL — ABNORMAL HIGH (ref 70–99)
Glucose-Capillary: 99 mg/dL (ref 70–99)
Glucose-Capillary: 111 mg/dL — ABNORMAL HIGH (ref 70–99)

## 2020-04-17 LAB — ECHOCARDIOGRAM COMPLETE
Height: 71 in
Weight: 3721.36 oz

## 2020-04-17 LAB — CBC
HCT: 44.2 % (ref 39.0–52.0)
Hemoglobin: 14.4 g/dL (ref 13.0–17.0)
MCH: 30.6 pg (ref 26.0–34.0)
MCHC: 32.6 g/dL (ref 30.0–36.0)
MCV: 94 fL (ref 80.0–100.0)
Platelets: 190 10*3/uL (ref 150–400)
RBC: 4.7 MIL/uL (ref 4.22–5.81)
RDW: 12.8 % (ref 11.5–15.5)
WBC: 8.6 10*3/uL (ref 4.0–10.5)
nRBC: 0 % (ref 0.0–0.2)

## 2020-04-17 LAB — TRIGLYCERIDES: Triglycerides: 85 mg/dL (ref <150)

## 2020-04-17 LAB — MAGNESIUM: Magnesium: 1.7 mg/dL (ref 1.7–2.4)

## 2020-04-17 LAB — RAPID URINE DRUG SCREEN, HOSP PERFORMED
Amphetamines: NOT DETECTED
Barbiturates: NOT DETECTED
Benzodiazepines: POSITIVE — AB
Cocaine: NOT DETECTED
Opiates: NOT DETECTED
Tetrahydrocannabinol: NOT DETECTED

## 2020-04-17 LAB — TROPONIN I (HIGH SENSITIVITY)
Troponin I (High Sensitivity): 37 ng/L — ABNORMAL HIGH (ref <18)
Troponin I (High Sensitivity): 28 ng/L — ABNORMAL HIGH (ref <18)

## 2020-04-17 LAB — HEMOGLOBIN A1C
Hgb A1c MFr Bld: 5.5 % (ref 4.8–5.6)
Mean Plasma Glucose: 111.15 mg/dL

## 2020-04-17 LAB — BLOOD PRODUCT ORDER (VERBAL) VERIFICATION

## 2020-04-17 LAB — PHOSPHORUS: Phosphorus: 1.6 mg/dL — ABNORMAL LOW (ref 2.5–4.6)

## 2020-04-17 MED ORDER — CHLORHEXIDINE GLUCONATE CLOTH 2 % EX PADS
6.0000 | MEDICATED_PAD | Freq: Every day | CUTANEOUS | Status: DC
  Administered 2020-04-17 – 2020-04-18 (×2): 6 via TOPICAL

## 2020-04-17 MED ORDER — CHLORHEXIDINE GLUCONATE 0.12% ORAL RINSE (MEDLINE KIT)
15.0000 mL | Freq: Two times a day (BID) | OROMUCOSAL | Status: DC
  Administered 2020-04-17 (×2): 15 mL via OROMUCOSAL

## 2020-04-17 MED ORDER — ETOMIDATE 2 MG/ML IV SOLN
INTRAVENOUS | Status: AC | PRN
  Administered 2020-04-16: 20 mg via INTRAVENOUS

## 2020-04-17 MED ORDER — PERFLUTREN LIPID MICROSPHERE
1.0000 mL | INTRAVENOUS | Status: AC | PRN
  Administered 2020-04-17: 3 mL via INTRAVENOUS
  Filled 2020-04-17: qty 10

## 2020-04-17 MED ORDER — ORAL CARE MOUTH RINSE
15.0000 mL | Freq: Two times a day (BID) | OROMUCOSAL | Status: DC
  Administered 2020-04-18 – 2020-04-20 (×6): 15 mL via OROMUCOSAL

## 2020-04-17 MED ORDER — PROPOFOL 1000 MG/100ML IV EMUL
INTRAVENOUS | Status: AC
  Administered 2020-04-17: 5 ug/kg/min via INTRAVENOUS
  Filled 2020-04-17: qty 100

## 2020-04-17 MED ORDER — SODIUM PHOSPHATES 45 MMOLE/15ML IV SOLN
20.0000 mmol | Freq: Once | INTRAVENOUS | Status: AC
  Administered 2020-04-17: 20 mmol via INTRAVENOUS
  Filled 2020-04-17: qty 6.67

## 2020-04-17 MED ORDER — ORAL CARE MOUTH RINSE
15.0000 mL | OROMUCOSAL | Status: DC
  Administered 2020-04-17 (×6): 15 mL via OROMUCOSAL

## 2020-04-17 MED ORDER — MAGNESIUM SULFATE 2 GM/50ML IV SOLN
2.0000 g | Freq: Once | INTRAVENOUS | Status: AC
  Administered 2020-04-17: 2 g via INTRAVENOUS
  Filled 2020-04-17: qty 50

## 2020-04-17 MED ORDER — FENTANYL CITRATE (PF) 100 MCG/2ML IJ SOLN
50.0000 ug | INTRAMUSCULAR | Status: DC | PRN
  Administered 2020-04-17: 50 ug via INTRAVENOUS
  Filled 2020-04-17: qty 2

## 2020-04-17 MED ORDER — ROCURONIUM BROMIDE 50 MG/5ML IV SOLN
INTRAVENOUS | Status: AC | PRN
  Administered 2020-04-16: 100 mg via INTRAVENOUS

## 2020-04-17 MED ORDER — SODIUM CHLORIDE 0.9 % IV SOLN
2.0000 g | INTRAVENOUS | Status: DC
  Administered 2020-04-17 – 2020-04-20 (×4): 2 g via INTRAVENOUS
  Filled 2020-04-17 (×5): qty 20

## 2020-04-17 MED ORDER — IPRATROPIUM-ALBUTEROL 0.5-2.5 (3) MG/3ML IN SOLN: 3.0000 mL | Freq: Four times a day (QID) | RESPIRATORY_TRACT | Status: DC | PRN

## 2020-04-17 MED ORDER — SODIUM CHLORIDE 0.9 % IV SOLN
INTRAVENOUS | Status: DC | PRN
  Administered 2020-04-17: 500 mL via INTRAVENOUS
  Administered 2020-04-20: 10 mL via INTRAVENOUS

## 2020-04-17 MED ORDER — OXYMETAZOLINE HCL 0.05 % NA SOLN
1.0000 | Freq: Two times a day (BID) | NASAL | Status: DC
  Administered 2020-04-17 – 2020-04-20 (×6): 1 via NASAL
  Filled 2020-04-17: qty 30

## 2020-04-17 MED ORDER — NOREPINEPHRINE 4 MG/250ML-% IV SOLN
INTRAVENOUS | Status: AC | PRN
  Administered 2020-04-16: 30 ug/kg/min via INTRAVENOUS

## 2020-04-17 NOTE — Progress Notes (Signed)
Patient's bedside prophylaxis.  Patient is on Eliquis and Plavix.  Majority of the bleeding seems to be coming from the right nare although it is difficult to tell.  The right ear with posterior nasal packing.  Bleeding seemed to stop.  May need packing in the other nare should bleeding persist.

## 2020-04-17 NOTE — Progress Notes (Signed)
Subjective: Some agitation overnight.   Exam: Vitals:   04/17/20 0722 04/17/20 0726  BP:    Pulse:  (!) 53  Resp:  18  Temp: (!) 101.7 F (38.7 C)   SpO2:  98%   Gen: In bed, NAD Resp: non-labored breathing, no acute distress Abd: soft, nt  Neuro: MS: Awakens easily, answers questions with shakes/nods CH:YIFOY, EOMI Motor: follows commands x4  Pertinent Labs: Lactate 3.2 -> 1.7 UDS + for benzos, given versed in ER prior to UDS collection Cr 1.38  CXR - bilateral PNA  Impression: 79 year old male presenting with altered mental status, hypotension, episodes of severe bradycardia.  My suspicion for a primary neurological etiology to his presentation is relatively low, but EEG is pending.  Currently watershed infarcts are a possibility, but is by no means clear at the current time and his improvement is reassuring. He was able to endorse that he had been lightheaded the day of presentation.   Recommendations: 1) Can hold on MRI, if symptomatic after extubation, could consider then. 2) EEG 3) will follow.   Roland Rack, MD [facility] [phone]  If 7pm- 7am, please page neurology on call as listed in [place].
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
## 2020-04-17 NOTE — ED Notes (Signed)
Pt transported to CT ?

## 2020-04-17 NOTE — ED Notes (Signed)
Pt given 1st unit of blood

## 2020-04-17 NOTE — Progress Notes (Signed)
Communication took place with bedside RN Anderson Malta who confirms blood cultures and lactic were drawn and that Abx's were given after [facility], RN scanned medications before [facility] but Abx's was truly hung after blood cultures.
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
## 2020-04-17 NOTE — Progress Notes (Signed)
   04/16/20 2234  Clinical Encounter Type  Visited With Health care provider;Family;Patient  Visit Type ED;Trauma  Referral From Nurse  Consult/Referral To [place] responded to page for family support. Family was in ED consult room A. Chaplain met family. Then Chaplain and MD met with family for MD to explain happenings. Chaplain and RN worked together over 4 hours allowing family members to visit Carloyn Manner. Family members were reminded several times to maintain the rule of one visitor in the room at a time. Sufyaan has a large family. Many of them were present outside of the ED for a large portion of the evening. Around midnight all family had gone home. Chaplains remain available for support as needs arise.   Chaplain Resident, Evelene Croon, M Div. [phone] on-call pager

## 2020-04-17 NOTE — ED Notes (Signed)
Levo stopped systolic > 093

## 2020-04-17 NOTE — ED Notes (Signed)
Levo titrated to 15, B/P > 761 systolic

## 2020-04-17 NOTE — Procedures (Signed)
Central Venous Catheter Insertion Procedure Note  Mukund Weinreb  [id number]  October 09, 1941  Date:04/17/20  Time:1:53 AM   Provider Performing:Taralynn Quiett A Mariane Masters   Procedure: Insertion of Non-tunneled Central Venous [phone]) with US guidance (01601)   Indication(s) Medication administration and Difficult access  Consent Risks of the procedure as well as the alternatives and risks of each were explained to the patient and/or caregiver.  Consent for the procedure was obtained and is signed in the bedside chart  Anesthesia Intubated on propofol  Timeout Verified patient identification, verified procedure, site/side was marked, verified correct patient position, special equipment/implants available, medications/allergies/relevant history reviewed, required imaging and test results available.  Sterile Technique Maximal sterile technique including full sterile barrier drape, hand hygiene, sterile gown, sterile gloves, mask, hair covering, sterile ultrasound probe cover (if used).  Procedure Description Area of catheter insertion was cleaned with chlorhexidine and draped in sterile fashion.  With real-time ultrasound guidance a central venous catheter was placed into the right femoral vein. Nonpulsatile blood flow and easy flushing noted in all ports.  The catheter was sutured in place and sterile dressing applied.  Complications/Tolerance None; patient tolerated the procedure well.   EBL Minimal  Specimen(s) None

## 2020-04-17 NOTE — Progress Notes (Signed)
eLink Physician-Brief Progress Note Patient Name: Travis Key DOB: 1940-10-17 [name]: [id number]   Date of Service  04/17/2020  HPI/Events of Note  RN requests temp-sensing foley and sedation (patient has started to wake up more and is more dyssynchronous with the ventilator).   eICU Interventions  Temp-foley ordered. Propofol drip ordered.     Intervention Category Minor Interventions: Other:;Agitation / anxiety - evaluation and management  Marily Lente Zeidy Tayag 04/17/2020, 12:00 AM

## 2020-04-17 NOTE — ED Notes (Signed)
Bear hugger placed

## 2020-04-17 NOTE — Progress Notes (Signed)
eLink Physician-Brief Progress Note Patient Name: Mirza Fessel DOB: [birth year] [name]: [id number]   Date of Service  04/17/2020  HPI/Events of Note  RN requests repeat lactic acid order for sepsis protocol.  Phos low.   eICU Interventions  Repeat lactic acid ordered. NaPhos 14mmol IV ordered.     Intervention Category Minor Interventions: Electrolytes abnormality - evaluation and management;Other:  Charlott Rakes 04/17/2020, 3:25 AM
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
## 2020-04-17 NOTE — Progress Notes (Signed)
eLink Physician-Brief Progress Note Patient Name: Travis Key DOB: 03-21-1941 [name]: [id number]   Date of Service  04/17/2020  HPI/Events of Note  Called for BP 82/54. Patient on propofol @ 25 and just received 100 mcg fentanyl push for agitation.  We decreased propofol to 15 and he seems adequately sedated even on this rate now that he has some fentanyl on board. Repeat SBP on this rate of propofol was 99 mmHg with MAP 64.   eICU Interventions  Decrease propofol further to rate of 10. Adjust fentanyl IV push dose to a range of 50-100 mcg. If MAP <\= 110mmHg despite these changes, will bolus 500cc IVF.     Intervention Category Intermediate Interventions: Hypotension - evaluation and management  Marily Lente Sakura Denis 04/17/2020, 5:12 AM

## 2020-04-17 NOTE — ED Notes (Signed)
Levo titrated from 30 to 40

## 2020-04-17 NOTE — Progress Notes (Signed)
eLink Physician-Brief Progress Note Patient Name: Travis Key DOB: 02-10-1941 [name]: [id number]   Date of Service  04/17/2020  HPI/Events of Note  RN requests KUB to evaluate OG tube position.  eICU Interventions  KUB ordered.     Intervention Category Minor Interventions: Other:  Charlott Rakes 04/17/2020, 2:09 AM

## 2020-04-17 NOTE — Procedures (Signed)
Extubation Procedure Note  Patient Details:   Name: Travis Key DOB: [birth year] [name]: [id number]   Airway Documentation:    Vent end date: 04/17/20 Vent end time: 1245   Evaluation  O2 sats: stable throughout Complications: No apparent complications Patient did tolerate procedure well. Bilateral Breath Sounds: Clear, Diminished   Yes, pt able to say his name.  Extubated pt to venti mask 40% 10L due to nose bleed.   Jorje Guild 04/17/2020, 12:47 PM

## 2020-04-17 NOTE — Procedures (Signed)
PROCEDURE NOTE:  Rhinorocket placement to bilateral nares due to epistaxis. Afrin spray applied prior to placement Taz tolerated the procedure well. Remove rhinorocket in 48-72h.   Mitzi Hansen, MD 04/17/20 11:22 AM

## 2020-04-17 NOTE — ED Notes (Signed)
Pt given 20 mcg epi and then 40 mcg epi for B/P 88/46

## 2020-04-17 NOTE — Progress Notes (Signed)
°  Echocardiogram 2D Echocardiogram with definity has been performed.  Darlina Sicilian M 04/17/2020, 8:20 AM

## 2020-04-17 NOTE — ED Notes (Signed)
Pt given 2nd unit of blood

## 2020-04-18 ENCOUNTER — Inpatient Hospital Stay (HOSPITAL_COMMUNITY): Payer: Medicare Other

## 2020-04-18 DIAGNOSIS — Z01818 Encounter for other preprocedural examination: Secondary | ICD-10-CM

## 2020-04-18 DIAGNOSIS — R55 Syncope and collapse: Secondary | ICD-10-CM

## 2020-04-18 LAB — URINE CULTURE: Culture: NO GROWTH

## 2020-04-18 LAB — BASIC METABOLIC PANEL
Anion gap: 9 (ref 5–15)
BUN: 17 mg/dL (ref 8–23)
CO2: 22 mmol/L (ref 22–32)
Calcium: 8 mg/dL — ABNORMAL LOW (ref 8.9–10.3)
Chloride: 108 mmol/L (ref 98–111)
Creatinine, Ser: 1.22 mg/dL (ref 0.61–1.24)
GFR calc Af Amer: >60 mL/min (ref >60)
GFR calc non Af Amer: 56 mL/min — ABNORMAL LOW (ref >60)
Glucose, Bld: 104 mg/dL — ABNORMAL HIGH (ref 70–99)
Potassium: 3.8 mmol/L (ref 3.5–5.1)
Sodium: 139 mmol/L (ref 135–145)

## 2020-04-18 LAB — GLUCOSE, CAPILLARY
Glucose-Capillary: 106 mg/dL — ABNORMAL HIGH (ref 70–99)
Glucose-Capillary: 108 mg/dL — ABNORMAL HIGH (ref 70–99)
Glucose-Capillary: 84 mg/dL (ref 70–99)
Glucose-Capillary: 85 mg/dL (ref 70–99)
Glucose-Capillary: 90 mg/dL (ref 70–99)
Glucose-Capillary: 99 mg/dL (ref 70–99)

## 2020-04-18 LAB — CBC
HCT: 37 % — ABNORMAL LOW (ref 39.0–52.0)
Hemoglobin: 11.9 g/dL — ABNORMAL LOW (ref 13.0–17.0)
MCH: 30.4 pg (ref 26.0–34.0)
MCHC: 32.2 g/dL (ref 30.0–36.0)
MCV: 94.6 fL (ref 80.0–100.0)
Platelets: 159 10*3/uL (ref 150–400)
RBC: 3.91 MIL/uL — ABNORMAL LOW (ref 4.22–5.81)
RDW: 13.2 % (ref 11.5–15.5)
WBC: 13.3 10*3/uL — ABNORMAL HIGH (ref 4.0–10.5)
nRBC: 0 % (ref 0.0–0.2)

## 2020-04-18 LAB — MAGNESIUM: Magnesium: 2 mg/dL (ref 1.7–2.4)

## 2020-04-18 MED ORDER — SALINE SPRAY 0.65 % NA SOLN
4.0000 | NASAL | Status: DC
  Administered 2020-04-18 – 2020-04-20 (×9): 4 via NASAL
  Filled 2020-04-18: qty 44

## 2020-04-18 MED ORDER — POTASSIUM CHLORIDE 20 MEQ/15ML (10%) PO SOLN
40.0000 meq | Freq: Once | ORAL | Status: AC
  Administered 2020-04-18: 40 meq via ORAL
  Filled 2020-04-18: qty 30
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
## 2020-04-18 NOTE — Discharge Instructions (Signed)
Epistaxis (Nose Bleed) Precautions:  1. Limited activity 2. Avoid nasal trauma 3. Saline nasal spray - 4 puffs/nostril 4 times daily 4. Saline gel ([place]) - apply to each nostril twice daily 5. Elevate Head of Bed 6. No nose blowing/Open mouth sneeze 

## 2020-04-18 NOTE — Progress Notes (Signed)
EEG complete - results pending 

## 2020-04-18 NOTE — Procedures (Signed)
ELECTROENCEPHALOGRAM REPORT   Patient: Travis Key       Room #: 2V03J EEG No. ID: [id number] Age: 79 y.o.        Sex: male Requesting Physician: Tamala Julian Report Date:  04/18/2020        Interpreting Physician: Alexis Goodell  History: Randall Colden is an 79 y.o. male with altered mental status  Medications:  Zithromax, Rocephin, Insulin,   Conditions of Recording:  This is a 21 channel routine scalp EEG performed with bipolar and monopolar montages arranged in accordance to the international 10/20 system of electrode placement. One channel was dedicated to EKG recording.  The patient is in the awake and drowsy states.  Description:  The waking background activity consists of a low voltage, symmetrical, fairly well organized, 9-10 Hz alpha activity, seen from the parieto-occipital and posterior temporal regions.  Low voltage fast activity, poorly organized, is seen anteriorly and is at times superimposed on more posterior regions.  A mixture of theta and alpha rhythms are seen from the central and temporal regions. The patient drowses with slowing to irregular, low voltage theta and beta activity.   Stage II sleep is not obtained. No epileptiform activity is noted.   Hyperventilation and intermittent photic stimulation were not performed.   IMPRESSION: Normal electroencephalogram, awake and drowsy. There are no focal lateralizing or epileptiform features.   Alexis Goodell, MD Neurology [phone] 04/18/2020, 5:14 PM

## 2020-04-18 NOTE — Progress Notes (Signed)
OT Cancellation Note  Patient Details Name: Brewster Wolters [name]: [id number] DOB: 18-Sep-1941   Cancelled Treatment:    Reason Eval/Treat Not Completed: Patient at procedure or test/ unavailable Pt starting EEG. OT will return as time allows and pt is appropriate.   [facility] OTR/L Acute Rehabilitation Services Office: [place] 04/18/2020, 3:40 PM
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
## 2020-04-18 NOTE — Progress Notes (Signed)
Subjective: Extubated, feels at his baseline.  Exam: Vitals:   04/18/20 1000 04/18/20 1100  BP: 136/69 132/71  Pulse: (!) 58 64  Resp: (!) 23 19  Temp: 98.8 F (37.1 C) 98 F (36.7 C)  SpO2: 95% 94%   Gen: In bed, NAD Resp: non-labored breathing, no acute distress Abd: soft, nt  Neuro: MS: Awakens easily, answers questions with shakes/nods SW:NIOEV, EOMI Motor: follows commands x4  Pertinent Labs:  CXR - bilateral PNA  Impression: 79 year old male presenting with altered mental status, hypotension, episodes of severe bradycardia.  My suspicion for a primary neurological etiology to his presentation is relatively low, but EEG is pending.  With improved exam, no need for MRI.   Recommendations: 1) EEG 2) if EEG is negative, no further recommendations for neuro workup.    Roland Rack, MD [facility] [phone]  If 7pm- 7am, please page neurology on call as listed in [place].

## 2020-04-19 LAB — BASIC METABOLIC PANEL
Anion gap: 10 (ref 5–15)
BUN: 15 mg/dL (ref 8–23)
CO2: 22 mmol/L (ref 22–32)
Calcium: 8.4 mg/dL — ABNORMAL LOW (ref 8.9–10.3)
Chloride: 107 mmol/L (ref 98–111)
Creatinine, Ser: 0.97 mg/dL (ref 0.61–1.24)
GFR calc Af Amer: >60 mL/min (ref >60)
GFR calc non Af Amer: >60 mL/min (ref >60)
Glucose, Bld: 101 mg/dL — ABNORMAL HIGH (ref 70–99)
Potassium: 4 mmol/L (ref 3.5–5.1)
Sodium: 139 mmol/L (ref 135–145)

## 2020-04-19 LAB — GLUCOSE, CAPILLARY
Glucose-Capillary: 103 mg/dL — ABNORMAL HIGH (ref 70–99)
Glucose-Capillary: 93 mg/dL (ref 70–99)
Glucose-Capillary: 139 mg/dL — ABNORMAL HIGH (ref 70–99)
Glucose-Capillary: 109 mg/dL — ABNORMAL HIGH (ref 70–99)
Glucose-Capillary: 113 mg/dL — ABNORMAL HIGH (ref 70–99)
Glucose-Capillary: 142 mg/dL — ABNORMAL HIGH (ref 70–99)

## 2020-04-19 LAB — CBC
HCT: 40.5 % (ref 39.0–52.0)
Hemoglobin: 13.3 g/dL (ref 13.0–17.0)
MCH: 31.1 pg (ref 26.0–34.0)
MCHC: 32.8 g/dL (ref 30.0–36.0)
MCV: 94.6 fL (ref 80.0–100.0)
Platelets: 172 10*3/uL (ref 150–400)
RBC: 4.28 MIL/uL (ref 4.22–5.81)
RDW: 12.9 % (ref 11.5–15.5)
WBC: 12.9 10*3/uL — ABNORMAL HIGH (ref 4.0–10.5)
nRBC: 0 % (ref 0.0–0.2)

## 2020-04-19 MED ORDER — CHLORHEXIDINE GLUCONATE CLOTH 2 % EX PADS
6.0000 | MEDICATED_PAD | Freq: Every day | CUTANEOUS | Status: DC
  Administered 2020-04-19: 6 via TOPICAL
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
## 2020-04-19 NOTE — Progress Notes (Signed)
Today is Mr. Jamesyn B-I-R-T-H-D-A-Y!  Chaplain celebrated with Mr. Jeanell Sparrow with a gift of a birthday balloon.   God Bless, Mr. Nash!  De Burrs Chaplain Resident

## 2020-04-19 NOTE — Progress Notes (Addendum)
Pt continues to have frequent runs of non-sustained vtach.  Spoke w/ Dr. Tamala Julian, CCM to relay who states will contact cards to discuss.
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
## 2020-04-20 LAB — CBC
HCT: 40.4 % (ref 39.0–52.0)
Hemoglobin: 13.1 g/dL (ref 13.0–17.0)
MCH: 30.3 pg (ref 26.0–34.0)
MCHC: 32.4 g/dL (ref 30.0–36.0)
MCV: 93.5 fL (ref 80.0–100.0)
Platelets: 204 10*3/uL (ref 150–400)
RBC: 4.32 MIL/uL (ref 4.22–5.81)
RDW: 12.6 % (ref 11.5–15.5)
WBC: 12.8 10*3/uL — ABNORMAL HIGH (ref 4.0–10.5)
nRBC: 0 % (ref 0.0–0.2)

## 2020-04-20 LAB — GLUCOSE, CAPILLARY
Glucose-Capillary: 91 mg/dL (ref 70–99)
Glucose-Capillary: 94 mg/dL (ref 70–99)

## 2020-04-20 LAB — BASIC METABOLIC PANEL
Anion gap: 8 (ref 5–15)
BUN: 12 mg/dL (ref 8–23)
CO2: 25 mmol/L (ref 22–32)
Calcium: 8.6 mg/dL — ABNORMAL LOW (ref 8.9–10.3)
Chloride: 109 mmol/L (ref 98–111)
Creatinine, Ser: 0.96 mg/dL (ref 0.61–1.24)
GFR calc Af Amer: >60 mL/min (ref >60)
GFR calc non Af Amer: >60 mL/min (ref >60)
Glucose, Bld: 95 mg/dL (ref 70–99)
Potassium: 3.5 mmol/L (ref 3.5–5.1)
Sodium: 142 mmol/L (ref 135–145)

## 2020-04-20 MED ORDER — INSULIN ASPART 100 UNIT/ML ~~LOC~~ SOLN: 0.0000 [IU] | Freq: Three times a day (TID) | SUBCUTANEOUS | Status: DC

## 2020-04-20 MED ORDER — AMOXICILLIN-POT CLAVULANATE 875-125 MG PO TABS
1.0000 | ORAL_TABLET | Freq: Two times a day (BID) | ORAL | 0 refills | Status: DC
Start: 2020-04-20 — End: 2020-04-20

## 2020-04-20 MED ORDER — SALINE SPRAY 0.65 % NA SOLN: 4.0000 | NASAL | 0 refills | Status: DC

## 2020-04-20 MED ORDER — AMOXICILLIN-POT CLAVULANATE 875-125 MG PO TABS: 1.0000 | ORAL_TABLET | Freq: Two times a day (BID) | ORAL | 0 refills | Status: AC

## 2020-04-20 MED ORDER — SALINE SPRAY 0.65 % NA SOLN: 4.0000 | NASAL | 0 refills | Status: AC
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
## 2020-04-20 NOTE — Discharge Summary (Signed)
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 Doppler.  LEFT VENTRICLE PLAX 2D LVIDd:         3.00 cm Diastology LVIDs:         2.30 cm LV e' lateral:   11.30 cm/s LV PW:         0.90 cm LV E/e' lateral: 5.2 LV IVS:        1.30 cm LV e' medial:    6.20 cm/s                        LV E/e' medial:  9.4  LEFT ATRIUM             Index       RIGHT ATRIUM           Index LA diam:        2.40 cm 1.07 cm/m  RA Area:     16.40 cm LA Vol (A2C):   36.0 ml 16.01 ml/m RA Volume:   40.90 ml  18.19 ml/m LA Vol (A4C):   38.2 ml 16.99 ml/m LA Biplane Vol: 38.6 ml 17.17 ml/m  AORTIC VALVE LVOT Vmax:   108.00 cm/s LVOT Vmean:  71.400 cm/s LVOT VTI:    0.243 m  AORTA Ao Root diam: 3.20 cm MITRAL VALVE MV Area (PHT): 2.02 cm    SHUNTS MV Decel Time: 375 msec    Systemic VTI: 0.24 m MV E velocity: 58.30 cm/s MV A velocity: 77.60 cm/s MV E/A ratio:  0.75 Rozann Lesches MD Electronically signed by Rozann Lesches MD Signature Date/Time: 04/17/2020/10:56:21 AM    Final     Microbiology: Recent Results (from the past 240 hour(s))  SARS Coronavirus 2 by RT PCR (hospital order, performed in [place] hospital lab) Nasopharyngeal Nasopharyngeal Swab     Status: None   Collection Time: 04/16/20  8:18 PM   Specimen: Nasopharyngeal Swab  Result Value Ref Range Status   SARS Coronavirus 2  NEGATIVE NEGATIVE Final    Comment: (NOTE) SARS-CoV-2 target nucleic acids are NOT DETECTED.  The SARS-CoV-2 RNA is generally detectable in upper and lower respiratory specimens during the acute phase of infection. The lowest concentration of SARS-CoV-2 viral copies this assay can detect is 250 copies / mL. A negative result does not preclude SARS-CoV-2 infection and should not be used as the sole basis for treatment or other patient management decisions.  A negative result may occur with improper specimen collection /  handling, submission of specimen other than nasopharyngeal swab, presence of viral mutation(s) within the areas targeted by this assay, and inadequate number of viral copies (<250 copies / mL). A negative result must be combined with clinical observations, patient history, and epidemiological information.  Fact Sheet for Patients:   StrictlyIdeas.no  Fact Sheet for Healthcare Providers: BankingDealers.co.za  This test is not yet approved or  cleared by the [place] FDA and has been authorized for detection and/or diagnosis of SARS-CoV-2 by FDA under an Emergency Use Authorization (EUA).  This EUA will remain in effect (meaning this test can be used) for the duration of the COVID-19 declaration under Section 564(b)(1) of the Act, 21 U.S.C. section 360bbb-3(b)(1), unless the authorization is terminated or revoked sooner.  Performed at [facility], [place] [address], [place], [place] [address]   Blood Culture (routine x 2)     Status: None (Preliminary result)   Collection Time: 04/16/20 10:14 PM   Specimen: BLOOD  Result Value Ref Range Status   Specimen Description BLOOD RIGHT SHOULDER  Final   Special Requests   Final    BOTTLES DRAWN AEROBIC AND ANAEROBIC Blood Culture results may not be optimal due to an inadequate volume of blood received in culture bottles   Culture   Final    NO GROWTH 4  DAYS Performed at [facility], [place] [address], [place], [place] [address]    Report Status PENDING  Incomplete  Blood Culture (routine x 2)     Status: None (Preliminary result)   Collection Time: 04/17/20  2:22 AM   Specimen: BLOOD  Result Value Ref Range Status   Specimen Description BLOOD LEFT ANTECUBITAL  Final   Special Requests   Final    BOTTLES DRAWN AEROBIC AND ANAEROBIC Blood Culture adequate volume   Culture   Final    NO GROWTH 3 DAYS Performed at [facility], [place] [address] [place], [place] [address]    Report Status PENDING  Incomplete  Urine culture     Status: None   Collection Time: 04/17/20  3:45 AM   Specimen: Urine, Catheterized  Result Value Ref Range Status   Specimen Description URINE, CATHETERIZED  Final   Special Requests NONE  Final   Culture   Final    NO GROWTH Performed at [facility], [address], [place], [place] [address]    Report Status 04/18/2020 FINAL  Final     Labs: Basic Metabolic Panel: Recent Labs  Lab 04/16/20 2008 04/16/20 2008 04/16/20 2010 04/16/20 2045 04/17/20 0222 04/17/20 7628 04/18/20 0331 04/18/20 1113 04/19/20 0316 04/20/20 0703  NA 141   < > 142   < > 143 143 139  --  139 142  K 3.7   < > 3.5   < > 3.5 4.2 3.8  --  4.0 3.5  CL 113*   < > 111  --  112*  --  108  --  107 109  CO2 17*  --   --   --  21*  --  22  --  22 25  GLUCOSE 205*   < > 203*  --  137*  --  104*  --  101* 95  BUN 17   < > 17  --  18  --  17  --  15 12  CREATININE 1.57*   < > 1.60*  --  1.38*  --  1.22  --  0.97 0.96  CALCIUM 8.0*  --   --   --  8.3*  --  8.0*  --  8.4* 8.6*  MG  --   --   --   --  1.7  --   --  2.0  --   --   PHOS  --   --   --   --  1.6*  --   --   --   --   --    < > = values in this interval not displayed.   Liver Function Tests: Recent Labs  Lab 04/16/20 2008  AST 25  ALT 16  ALKPHOS 44  BILITOT 0.8  PROT 5.5*  ALBUMIN 2.9*   No results for input(s): LIPASE, AMYLASE in the last 168  hours. No results for input(s): AMMONIA in the last 168 hours. CBC: Recent Labs  Lab 04/16/20 2008 04/16/20 2010 04/17/20 0222 04/17/20 0655 04/18/20 0331 04/19/20 0316 04/20/20 0703  WBC 9.5  --  8.6  --  13.3* 12.9* 12.8*  HGB 10.5*   < > 14.4 13.3 11.9* 13.3 13.1  HCT 36.9*   < > 44.2 39.0 37.0* 40.5 40.4  MCV 108.2*  --  94.0  --  94.6 94.6 93.5  PLT 203  --  190  --  159 172 204   < > = values in this interval not displayed.    CBG: Recent Labs  Lab 04/19/20 1104 04/19/20 1510 04/19/20 1910 04/19/20 2311 04/20/20 0632  GLUCAP 139* 109* 113* 142* 91       Signed:  Oswald Hillock MD.  [facility] 04/20/2020, 11:02 AM

## 2020-04-20 NOTE — Progress Notes (Signed)
Received pt from 18M via wheelchair, pt alert and oriented x 4, placed on cardiac monitor CCMD made aware. Report given by Zoila Shutter.

## 2020-04-21 ENCOUNTER — Encounter: Payer: Self-pay | Admitting: Adult Health

## 2020-04-21 LAB — CULTURE, BLOOD (ROUTINE X 2): Culture: NO GROWTH

## 2020-04-22 LAB — CULTURE, BLOOD (ROUTINE X 2)
Culture: NO GROWTH
Special Requests: ADEQUATE

## 2020-04-26 DIAGNOSIS — R040 Epistaxis: Secondary | ICD-10-CM | POA: Diagnosis not present

## 2020-04-29 ENCOUNTER — Other Ambulatory Visit: Payer: Self-pay

## 2020-04-29 DIAGNOSIS — I252 Old myocardial infarction: Secondary | ICD-10-CM | POA: Diagnosis not present

## 2020-04-29 DIAGNOSIS — Z7901 Long term (current) use of anticoagulants: Secondary | ICD-10-CM | POA: Diagnosis not present

## 2020-04-29 DIAGNOSIS — Z955 Presence of coronary angioplasty implant and graft: Secondary | ICD-10-CM | POA: Insufficient documentation

## 2020-04-29 DIAGNOSIS — I2510 Atherosclerotic heart disease of native coronary artery without angina pectoris: Secondary | ICD-10-CM | POA: Insufficient documentation

## 2020-04-29 DIAGNOSIS — Z87891 Personal history of nicotine dependence: Secondary | ICD-10-CM | POA: Insufficient documentation

## 2020-04-29 DIAGNOSIS — Z79899 Other long term (current) drug therapy: Secondary | ICD-10-CM | POA: Diagnosis not present

## 2020-04-29 DIAGNOSIS — R040 Epistaxis: Secondary | ICD-10-CM | POA: Insufficient documentation

## 2020-04-29 DIAGNOSIS — I10 Essential (primary) hypertension: Secondary | ICD-10-CM | POA: Diagnosis not present

## 2020-04-29 DIAGNOSIS — I480 Paroxysmal atrial fibrillation: Secondary | ICD-10-CM | POA: Insufficient documentation

## 2020-04-29 DIAGNOSIS — Z7982 Long term (current) use of aspirin: Secondary | ICD-10-CM | POA: Diagnosis not present

## 2020-04-30 ENCOUNTER — Emergency Department (HOSPITAL_COMMUNITY)
Admission: EM | Admit: 2020-04-30 | Discharge: 2020-04-30 | Disposition: A | Discharge status: Home / Self Care | Payer: Medicare Other | Attending: Emergency Medicine | Admitting: Emergency Medicine

## 2020-04-30 ENCOUNTER — Other Ambulatory Visit: Payer: Self-pay

## 2020-04-30 ENCOUNTER — Encounter (HOSPITAL_COMMUNITY): Payer: Self-pay | Admitting: *Deleted

## 2020-04-30 DIAGNOSIS — R040 Epistaxis: Secondary | ICD-10-CM

## 2020-04-30 LAB — SAMPLE TO BLOOD BANK

## 2020-04-30 LAB — CBC
HCT: 42.2 % (ref 39.0–52.0)
Hemoglobin: 13.2 g/dL (ref 13.0–17.0)
MCH: 30.2 pg (ref 26.0–34.0)
MCHC: 31.3 g/dL (ref 30.0–36.0)
MCV: 96.6 fL (ref 80.0–100.0)
Platelets: 398 10*3/uL (ref 150–400)
RBC: 4.37 MIL/uL (ref 4.22–5.81)
RDW: 13.1 % (ref 11.5–15.5)
WBC: 11.2 10*3/uL — ABNORMAL HIGH (ref 4.0–10.5)
nRBC: 0 % (ref 0.0–0.2)

## 2020-04-30 LAB — PROTIME-INR
INR: 1.1 (ref 0.8–1.2)
Prothrombin Time: 14.2 seconds (ref 11.4–15.2)

## 2020-04-30 MED ORDER — OXYMETAZOLINE HCL 0.05 % NA SOLN
1.0000 | Freq: Once | NASAL | Status: AC
  Administered 2020-04-30: 1 via NASAL
  Filled 2020-04-30: qty 30

## 2020-04-30 NOTE — ED Triage Notes (Signed)
Nosed bleeding July 4th  Packing placed in theed he had pacing in until th 8th dr shoemaker removed the pacjking  Tonight thert nostril has been bleeding for 2-3 hours  Minimal at present

## 2020-04-30 NOTE — ED Notes (Signed)
Discharge instructions reviewed with pt. Pt verbalized understanding.   

## 2020-04-30 NOTE — ED Notes (Signed)
Pt back from waiting room, triage nurse reported pt saturated 2 ABD pads in lobby. Large clot on ABD pad when pt arrived to room. Bleeding minimal at this time.

## 2020-04-30 NOTE — ED Provider Notes (Signed)
[place] EMERGENCY DEPARTMENT Provider Note   CSN: [id number] Arrival date & time: 04/29/20  2331     History Chief Complaint  Patient presents with  . Epistaxis    Travis Key is a 79 y.o. male.  The history is provided by the patient, medical records and the spouse.  Epistaxis Location:  R nare Severity:  Moderate Duration:  3 hours Timing:  Intermittent Progression:  Waxing and waning Chronicity:  Recurrent Context: trauma   Context: not anticoagulants, not foreign body, not nose picking and not recent infection   Relieved by:  Nothing Ineffective treatments:  Applying pressure and vasoconstrictors Associated symptoms: blood in oropharynx        Past Medical History:  Diagnosis Date  . Adenomatous polyp of colon 08/2003  . Anal fissure   . CAD S/P LAD PCI for Antero-Posterior STEMI 09/2017; 08/2018   a) 12/18 STEMI PCI/DESx1 (Xience [place] 4.0 x 38 - 4.6 mm) mLAD, CTO of RCA, normal EF; b) 90% m-dCx (DES PCI - Xience Sierra 3.5 x 15).  patent LAD stent, stable known non-dom RCA CTO.  Marland Kitchen Coronary artery disease   . Diverticulosis   . High cholesterol   . History of myocardial infarction: Anterior - posterior MI (LAD lesion - PCI) 09/14/2017   a) Ant STEMI: STENT SIERRA 4.00 X 38 MM. --Postdilated to 4.6 mm p-m LAD;; b)  NSTEMI 08/2018 - STENT SIERRA 3.5 X 15 MM) m-dCx  . Hypertension   . Internal hemorrhoids   . MI (myocardial infarction) ([place])   . Paroxysmal A-fib [facility])     Patient Active Problem List   Diagnosis Date Noted  . Encounter for intubation   . Syncope and collapse   . Acute respiratory failure ([place]) 04/16/2020  . Encephalopathy   . Coronary artery disease involving native heart without angina pectoris   . Daytime sleepiness 10/23/2018  . Unstable angina ([place]) 09/04/2018  . Paroxysmal atrial fibrillation ([place]) 09/04/2018  . Coronary artery disease, occluded RCA with bridging collaterals and left-to-right collaterals  09/04/2018  . 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 Essential hypertension 09/16/2017  . Hyperlipidemia with target low density lipoprotein (LDL) cholesterol less than 70 mg/dL 09/16/2017  . History of myocardial infarction: Anterior - posterior MI (LAD lesion - PCI) 09/14/2017  . CAD S/P percutaneous coronary angioplasty 09/14/2017    Past Surgical History:  Procedure Laterality Date  . CARDIOVERSION N/A 09/28/2018   Procedure: CARDIOVERSION;  Surgeon: Larey Dresser, MD;  Location: [place];  Service: Cardiovascular;  Laterality: N/A;  . CORONARY ANGIOPLASTY WITH STENT PLACEMENT    . CORONARY STENT INTERVENTION N/A 09/14/2017   Procedure: CORONARY STENT INTERVENTION;  Surgeon: Leonie Man, MD;  Location: [place] [facility];  Service: Cardiovascular:: p-mLAD 95% (tandem lesions 80&95%) --> PCI with 2 overlapping Xience [place] DES (distal 4.0 x 38 -> prox 4.0 x 12 --> post-dilated to ~4.6 mm.], 0% residual  . CORONARY STENT INTERVENTION N/A 09/04/2018   Procedure: CORONARY STENT INTERVENTION;  Surgeon: Martinique, Peter M, MD;  Location: [facility];  Service: Cardiovascular: m-dCx 90% (DES PCI Xience Sierra 3.5 x 15 -- 3.8 mm)  . CORONARY/GRAFT ACUTE MI REVASCULARIZATION N/A 09/14/2017   Procedure: Coronary/Graft Acute MI Revascularization;  Surgeon: Leonie Man, MD;  Location: [place] [facility];  Service: Cardiovascular;  Laterality: N/A;  . LEFT HEART CATH AND CORONARY ANGIOGRAPHY N/A 09/14/2017   Procedure: LEFT HEART CATH AND CORONARY ANGIOGRAPHY;  Surgeon: Leonie Man, MD;  Location: [facility];  p-m LAD 85-95% tandem lesions (PCI), 0% residual.  OstD2 ~50%, m-dCx 60%, LPL1 ~50%. Small Non-dom RCA 100% CTO ~mid.  EF ~45-50% with apical anterior, apical & inferoapical HK.  Marland Kitchen LEFT HEART CATH AND CORONARY ANGIOGRAPHY N/A 09/04/2018   Procedure: LEFT HEART CATH AND CORONARY ANGIOGRAPHY;  Surgeon: Martinique, Peter M, MD;  Location: [place] [facility];  Service: Cardiovascular:  m-dCx 90% (DES PCI). Patent p-mLAD (STENT SIERRA 4.00 X 38 MM. --Postdilated to 4.6 mm) stent with stable 50% ost D2  (jailed). known CTO of pRCA with R-R collaterals (non-dominant).    . ROTATOR CUFF REPAIR Right   . TRANSTHORACIC ECHOCARDIOGRAM  09/2017; 08/2018   a) In setting of anterior STEMI: EF 50 and 55%.  Moderate LVH.  Moderate HK of mid-apical anteroseptal wall.  Mild aortic valve calcification.;; b) EF up to 55-60%.  No R WMA.  Unable to assess diastolic function because of atrial fibrillation.  Mild LA dilation.  Trivial MR.   Marland Kitchen UMBILICAL HERNIA REPAIR         Family History  Problem Relation Age of Onset  . Diabetes Mother   . Liver disease Mother     Social History   Tobacco Use  . Smoking status: Former Smoker    Types: Cigarettes    Quit date: 03/02/1985    Years since quitting: 35.1  . Smokeless tobacco: Never Used  Vaping Use  . Vaping Use: Unknown  Substance Use Topics  . Alcohol use: No    Alcohol/week: 0.0 standard drinks  . Drug use: No    Home Medications Prior to Admission medications   Medication Sig Start Date End Date Taking? Authorizing Provider  amLODipine (NORVASC) 10 MG tablet Take 1 tablet (10 mg total) by mouth daily. 03/27/20 06/25/20  Lendon Colonel, NP  apixaban (ELIQUIS) 5 MG TABS tablet Take 1 tablet (5 mg total) by mouth 2 (two) times daily. 03/27/20   Lendon Colonel, NP  carvedilol (COREG) 6.25 MG tablet Take 1 tablet (6.25 mg total) by mouth 2 (two) times daily. 03/27/20   Lendon Colonel, NP  carvedilol (COREG) 6.25 MG tablet Take 6.25 mg by mouth 2 (two) times daily. 04/10/20   [provider]  clopidogrel (PLAVIX) 75 MG tablet Take 1 tablet (75 mg total) by mouth daily with breakfast. 03/27/20   Lendon Colonel, NP  ELIQUIS 5 MG TABS tablet Take 5 mg by mouth 2 (two) times daily. 04/10/20   [provider]  ezetimibe (ZETIA) 10 MG tablet Take 1 tablet (10 mg total) by mouth daily. 03/29/20 06/27/20   Lendon Colonel, NP  ezetimibe (ZETIA) 10 MG tablet Take 10 mg by mouth daily. 03/29/20   [provider]  irbesartan (AVAPRO) 300 MG tablet Take 1 tablet (300 mg total) by mouth daily. 03/27/20   Lendon Colonel, NP  irbesartan (AVAPRO) 300 MG tablet Take 300 mg by mouth daily. 03/23/20   [provider]  nitroGLYCERIN (NITROSTAT) 0.4 MG SL tablet Place 1 tablet (0.4 mg total) under the tongue every 5 (five) minutes as needed. 09/16/17   Cheryln Manly, NP  rosuvastatin (CRESTOR) 40 MG tablet Take 1 tablet (40 mg total) by mouth at bedtime. 03/27/20   Lendon Colonel, NP  rosuvastatin (CRESTOR) 40 MG tablet Take 40 mg by mouth daily. 04/10/20   [provider]  sodium chloride (OCEAN) 0.65 % SOLN nasal spray Place 4 sprays into both nostrils every  4 (four) hours while awake. 04/20/20   Oswald Hillock, MD    Allergies    Patient has no known allergies.  Review of Systems   Review of Systems  HENT: Positive for nosebleeds.   All other systems reviewed and are negative.   Physical Exam Updated Vital Signs BP (!) 150/86   Pulse 61   Temp 98.5 F (36.9 C) (Oral)   Resp 15   Ht 5\' 11"  (1.803 m)   Wt 93.9 kg   SpO2 97%   BMI 28.87 kg/m   Physical Exam Vitals and nursing note reviewed.  Constitutional:      Appearance: He is well-developed.  HENT:     Head: Normocephalic and atraumatic.     Nose:     Comments: bllood from right nare    Mouth/Throat:     Mouth: Mucous membranes are moist.     Pharynx: Oropharynx is clear.  Eyes:     Conjunctiva/sclera: Conjunctivae normal.     Pupils: Pupils are equal, round, and reactive to light.  Cardiovascular:     Rate and Rhythm: Normal rate.  Pulmonary:     Effort: Pulmonary effort is normal. No respiratory distress.  Abdominal:     General: Abdomen is flat. There is no distension.  Musculoskeletal:        General: No swelling, tenderness or deformity. Normal range of motion.     Cervical back:  Normal range of motion.  Skin:    General: Skin is warm and dry.  Neurological:     General: No focal deficit present.     Mental Status: He is alert.     ED Results / Procedures / Treatments   Labs (all labs ordered are listed, but only abnormal results are displayed) Labs Reviewed  CBC - Abnormal; Notable for the following components:      Result Value   WBC 11.2 (*)    All other components within normal limits  PROTIME-INR  SAMPLE TO BLOOD BANK    EKG None  Radiology No results found.  Procedures .Epistaxis Management  Date/Time: 04/30/2020 5:15 AM Performed by: Merrily Pew, MD Authorized by: Merrily Pew, MD   Consent:    Consent obtained:  Verbal   Consent given by:  Patient and spouse   Risks discussed:  Bleeding, nasal injury and pain   Alternatives discussed:  No treatment Anesthesia (see MAR for exact dosages):    Anesthesia method:  None Procedure details:    Treatment site:  L anterior   Treatment method:  Nasal balloon   Treatment complexity:  Limited   Treatment episode: initial   Post-procedure details:    Assessment:  Bleeding stopped   Patient tolerance of procedure:  Tolerated well, no immediate complications   (including critical care time)  Medications Ordered in ED Medications  oxymetazoline (AFRIN) 0.05 % nasal spray 1 spray (1 spray Each Nare Given 04/30/20 0140)    ED Course  I have reviewed the triage vital signs and the nursing notes.  Pertinent labs & imaging results that were available during my care of the patient were reviewed by me and considered in my medical decision making (see chart for details).    MDM Rules/Calculators/A&P                          Afrin didn't help. Packed with rapid rhino and approximately 8cc air. Will have ENT follow up.   Final Clinical Impression(s) /  ED Diagnoses Final diagnoses:  Epistaxis    Rx / DC Orders ED Discharge Orders    None       Venola Castello, Corene Cornea, MD 04/30/20 [phone]

## 2020-05-05 DIAGNOSIS — R040 Epistaxis: Secondary | ICD-10-CM | POA: Diagnosis not present

## 2020-05-16 ENCOUNTER — Encounter: Payer: Self-pay | Admitting: Physician Assistant
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
## 2020-05-19 ENCOUNTER — Other Ambulatory Visit: Payer: Self-pay

## 2020-05-19 ENCOUNTER — Ambulatory Visit (INDEPENDENT_AMBULATORY_CARE_PROVIDER_SITE_OTHER): Payer: Medicare Other | Admitting: Physician Assistant

## 2020-05-19 ENCOUNTER — Encounter: Payer: Self-pay | Admitting: Physician Assistant

## 2020-05-19 VITALS — BP 150/90 | HR 62 | Ht 71.0 in | Wt 211.0 lb

## 2020-05-19 DIAGNOSIS — R040 Epistaxis: Secondary | ICD-10-CM | POA: Diagnosis not present

## 2020-05-19 DIAGNOSIS — I200 Unstable angina: Secondary | ICD-10-CM

## 2020-05-19 DIAGNOSIS — I2510 Atherosclerotic heart disease of native coronary artery without angina pectoris: Secondary | ICD-10-CM

## 2020-05-19 DIAGNOSIS — I480 Paroxysmal atrial fibrillation: Secondary | ICD-10-CM | POA: Diagnosis not present

## 2020-05-19 DIAGNOSIS — R001 Bradycardia, unspecified: Secondary | ICD-10-CM

## 2020-05-19 DIAGNOSIS — Z91013 Allergy to seafood: Secondary | ICD-10-CM | POA: Diagnosis not present

## 2020-05-19 DIAGNOSIS — E785 Hyperlipidemia, unspecified: Secondary | ICD-10-CM | POA: Diagnosis not present

## 2020-05-19 DIAGNOSIS — R4182 Altered mental status, unspecified: Secondary | ICD-10-CM

## 2020-05-19 DIAGNOSIS — I10 Essential (primary) hypertension: Secondary | ICD-10-CM | POA: Diagnosis not present

## 2020-05-19 LAB — LIPID PANEL
Chol/HDL Ratio: 2.5 ratio (ref 0.0–5.0)
Cholesterol, Total: 123 mg/dL (ref 100–199)
HDL: 50 mg/dL (ref >39)
LDL Chol Calc (NIH): 58 mg/dL (ref 0–99)
Triglycerides: 73 mg/dL (ref 0–149)
VLDL Cholesterol Cal: 15 mg/dL (ref 5–40)

## 2020-05-19 MED ORDER — AMLODIPINE BESYLATE 5 MG PO TABS
5.0000 mg | ORAL_TABLET | Freq: Every day | ORAL | 3 refills | Status: DC
Start: 2020-05-19 — End: 2021-03-22

## 2020-05-19 NOTE — Patient Instructions (Signed)
Medication Instructions:  Start Amlodipine 5mg  Daily (Increase to 10mg  if Blood Pressure Greater than 130/80  *If you need a refill on your cardiac medications before your next appointment, please call your pharmacy*   Lab Work: None If you have labs (blood work) drawn today and your tests are completely normal, you will receive your results only by:  [place] (if you have MyChart) OR  A paper copy in the mail If you have any lab test that is abnormal or we need to change your treatment, we will call you to review the results.   Testing/Procedures: None   Follow-Up: At [facility], you and your health needs are our priority.  As part of our continuing mission to provide you with exceptional heart care, we have created designated Provider Care Teams.  These Care Teams include your primary Cardiologist (physician) and Advanced Practice Providers (APPs -  Physician Assistants and Nurse Practitioners) who all work together to provide you with the care you need, when you need it.  Your next appointment:   August 28, 2020  The format for your next appointment:   In Person  Provider:   Glenetta Hew, MD

## 2020-07-12 DIAGNOSIS — Z23 Encounter for immunization: Secondary | ICD-10-CM | POA: Diagnosis not present

## 2020-08-28 ENCOUNTER — Ambulatory Visit: Payer: Medicare Other | Admitting: Cardiology

## 2020-09-11 ENCOUNTER — Ambulatory Visit (INDEPENDENT_AMBULATORY_CARE_PROVIDER_SITE_OTHER): Payer: Medicare Other | Admitting: Cardiology

## 2020-09-11 ENCOUNTER — Encounter: Payer: Self-pay | Admitting: Cardiology

## 2020-09-11 ENCOUNTER — Other Ambulatory Visit: Payer: Self-pay

## 2020-09-11 VITALS — BP 160/73 | HR 56 | Temp 97.3°F | Ht 71.0 in | Wt 215.8 lb

## 2020-09-11 DIAGNOSIS — I2510 Atherosclerotic heart disease of native coronary artery without angina pectoris: Secondary | ICD-10-CM | POA: Diagnosis not present

## 2020-09-11 DIAGNOSIS — I10 Essential (primary) hypertension: Secondary | ICD-10-CM

## 2020-09-11 DIAGNOSIS — I200 Unstable angina: Secondary | ICD-10-CM

## 2020-09-11 DIAGNOSIS — Z9861 Coronary angioplasty status: Secondary | ICD-10-CM | POA: Diagnosis not present

## 2020-09-11 DIAGNOSIS — I255 Ischemic cardiomyopathy: Secondary | ICD-10-CM | POA: Diagnosis not present

## 2020-09-11 DIAGNOSIS — E785 Hyperlipidemia, unspecified: Secondary | ICD-10-CM

## 2020-09-11 DIAGNOSIS — I25119 Atherosclerotic heart disease of native coronary artery with unspecified angina pectoris: Secondary | ICD-10-CM | POA: Diagnosis not present

## 2020-09-11 DIAGNOSIS — I493 Ventricular premature depolarization: Secondary | ICD-10-CM

## 2020-09-11 DIAGNOSIS — I480 Paroxysmal atrial fibrillation: Secondary | ICD-10-CM | POA: Diagnosis not present

## 2020-09-11 DIAGNOSIS — I252 Old myocardial infarction: Secondary | ICD-10-CM

## 2020-09-11 DIAGNOSIS — R55 Syncope and collapse: Secondary | ICD-10-CM | POA: Diagnosis not present
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
## 2020-09-11 NOTE — Patient Instructions (Addendum)
Medication Instructions:   NO CHANGES  *If you need a refill on your cardiac medications before your next appointment, please call your pharmacy* Other Instructions   CHECK  BLOOD PRESSURE RANDOMLY EVERYDAY FOR 3 WEEKS  AT [facility] .  PLEASE  CALL OFFICE OR SEND COPY OF THE READINGS.  Lab Work: Lipid CMP BOTH April 2022 -FASTING  If you have labs (blood work) drawn today and your tests are completely normal, you will receive your results only by: Marland Kitchen MyChart Message (if you have MyChart) OR . A paper copy in the mail If you have any lab test that is abnormal or we need to change your treatment, we will call you to review the results.   Testing/Procedures: Will be schedule at [facility] [address] - in April 2022 Your physician has requested that you have an echocardiogram. Echocardiography is a painless test that uses sound waves to create images of your heart. It provides your doctor with information about the size and shape of your heart and how well your heart's chambers and valves are working. This procedure takes approximately one hour. There are no restrictions for this procedure.     Follow-Up: At [facility], you and your health needs are our priority.  As part of our continuing mission to provide you with exceptional heart care, we have created designated Provider Care Teams.  These Care Teams include your primary Cardiologist (physician) and Advanced Practice Providers (APPs -  Physician Assistants and Nurse Practitioners) who all work together to provide you with the care you need, when you need it.     Your next appointment:   6 month(s)  The format for your next appointment:   In Person  Provider:   Glenetta Hew, MD

## 2020-09-19 ENCOUNTER — Encounter: Payer: Self-pay | Admitting: Cardiology

## 2020-09-19 NOTE — Assessment & Plan Note (Signed)
No active angina.  Stable medications listed: Amlodipine, irbesartan and carvedilol along with rosuvastatin.  No recurrent anginal symptoms.  Is on apixaban for anticoagulation and not on aspirin Plavix -secondary to history of bleeding on combination.

## 2020-09-19 NOTE — Assessment & Plan Note (Signed)
Plan to recheck an echocardiogram just to reassess EF and wall motion.  On ARB, beta-blocker, calcium blocker and statin, not currently on diuretic.

## 2020-09-19 NOTE — Assessment & Plan Note (Signed)
Currently has PVCs.  He is on carvedilol.  Low-dose, low resting bradycardia 56 bpm, cannot titrate further.  Plan: We will check 2D echo just to exclude structural abnormalities or wall motion normalities.

## 2020-09-19 NOTE — Assessment & Plan Note (Signed)
Blood pressure not adequately controlled.  He is on amlodipine, restart losartan and carvedilol.  Not on diuretic.  Since he is already on his medications I think the next appropriate step would be to further titrate amlodipine back to 10 mg daily.  Once this is complete, I think Needs to be STEMI diuretic dose.  Thankfully, he is not having significant heart failure symptoms.

## 2020-09-19 NOTE — Assessment & Plan Note (Addendum)
LDL is now back down to where it should be-most recently is 58 which correlates with increasing back to the 40 mg of simvastatin.  No change.  Tolerating well.  We will order labs and follow-up clinic.

## 2020-09-19 NOTE — Assessment & Plan Note (Signed)
As such, no further signs or symptoms of angina.  Currently on amlodipine and carvedilol tolerating meds correct.    Plan: Continue beta-blocker, calcium channel blocker and statin.  Increasing statin dose and amlodipine dose (amlodipine to 10 mg, and rosuvastatin back up to 40 mg.

## 2020-09-19 NOTE — Assessment & Plan Note (Signed)
No further episodes

## 2020-09-19 NOTE — Assessment & Plan Note (Signed)
As of now, no acute need for further ischemic evaluation.  He is on previous stable regimen with beta-blocker, calcium channel blocker and statin.  Not on aspirin Plavix because of being on Eliquis and recent nosebleed having stopped Plavix.  This is acceptable at this point.

## 2020-09-19 NOTE — Assessment & Plan Note (Signed)
Minimal/trivial signs symptoms of heart failure.  Plan follow-up echocardiogram to reassess EF and wall motion..  Abnormalities.  Most recent EF by echo was 45 to 50% back in July.  I hope that there has been further titrate up his medications for better blood pressure control, we can get a better EF

## 2020-09-19 NOTE — Assessment & Plan Note (Signed)
Further intra-abdominal breakthrough episodes of A. fib since his MI.  He remains on carvedilol for rate control Eliquis or anticoagulation.

## 2020-10-04 DIAGNOSIS — H5212 Myopia, left eye: Secondary | ICD-10-CM | POA: Diagnosis not present

## 2020-10-04 DIAGNOSIS — I10 Essential (primary) hypertension: Secondary | ICD-10-CM | POA: Diagnosis not present

## 2020-10-04 DIAGNOSIS — H35039 Hypertensive retinopathy, unspecified eye: Secondary | ICD-10-CM | POA: Diagnosis not present

## 2020-10-04 DIAGNOSIS — H524 Presbyopia: Secondary | ICD-10-CM | POA: Diagnosis not present

## 2020-10-04 DIAGNOSIS — H52221 Regular astigmatism, right eye: Secondary | ICD-10-CM | POA: Diagnosis not present

## 2020-10-04 DIAGNOSIS — H11159 Pinguecula, unspecified eye: Secondary | ICD-10-CM | POA: Diagnosis not present

## 2020-10-04 DIAGNOSIS — H25099 Other age-related incipient cataract, unspecified eye: Secondary | ICD-10-CM | POA: Diagnosis not present

## 2021-01-05 ENCOUNTER — Other Ambulatory Visit: Payer: Self-pay | Admitting: Cardiology

## 2021-01-05 DIAGNOSIS — Z9861 Coronary angioplasty status: Secondary | ICD-10-CM | POA: Diagnosis not present

## 2021-01-05 DIAGNOSIS — I25119 Atherosclerotic heart disease of native coronary artery with unspecified angina pectoris: Secondary | ICD-10-CM | POA: Diagnosis not present

## 2021-01-05 DIAGNOSIS — I255 Ischemic cardiomyopathy: Secondary | ICD-10-CM | POA: Diagnosis not present

## 2021-01-05 DIAGNOSIS — I2510 Atherosclerotic heart disease of native coronary artery without angina pectoris: Secondary | ICD-10-CM | POA: Diagnosis not present

## 2021-01-05 DIAGNOSIS — E785 Hyperlipidemia, unspecified: Secondary | ICD-10-CM | POA: Diagnosis not present

## 2021-01-06 LAB — COMPREHENSIVE METABOLIC PANEL
ALT: 19 IU/L (ref 0–44)
AST: 19 IU/L (ref 0–40)
Albumin/Globulin Ratio: 1.4 (ref 1.2–2.2)
Albumin: 4.2 g/dL (ref 3.7–4.7)
Alkaline Phosphatase: 72 IU/L (ref 44–121)
BUN/Creatinine Ratio: 14 (ref 10–24)
BUN: 18 mg/dL (ref 8–27)
Bilirubin Total: 0.7 mg/dL (ref 0.0–1.2)
CO2: 22 mmol/L (ref 20–29)
Calcium: 9.2 mg/dL (ref 8.6–10.2)
Chloride: 102 mmol/L (ref 96–106)
Creatinine, Ser: 1.31 mg/dL — ABNORMAL HIGH (ref 0.76–1.27)
Globulin, Total: 3.1 g/dL (ref 1.5–4.5)
Glucose: 105 mg/dL — ABNORMAL HIGH (ref 65–99)
Potassium: 4.5 mmol/L (ref 3.5–5.2)
Sodium: 139 mmol/L (ref 134–144)
Total Protein: 7.3 g/dL (ref 6.0–8.5)
eGFR: 55 mL/min/{1.73_m2} — ABNORMAL LOW (ref >59)

## 2021-01-15 ENCOUNTER — Ambulatory Visit (HOSPITAL_COMMUNITY): Payer: Medicare Other | Attending: Internal Medicine

## 2021-01-15 ENCOUNTER — Other Ambulatory Visit: Payer: Self-pay

## 2021-01-15 DIAGNOSIS — Z9861 Coronary angioplasty status: Secondary | ICD-10-CM | POA: Diagnosis not present

## 2021-01-15 DIAGNOSIS — I255 Ischemic cardiomyopathy: Secondary | ICD-10-CM | POA: Insufficient documentation

## 2021-01-15 DIAGNOSIS — I2510 Atherosclerotic heart disease of native coronary artery without angina pectoris: Secondary | ICD-10-CM | POA: Diagnosis not present

## 2021-01-15 DIAGNOSIS — I25119 Atherosclerotic heart disease of native coronary artery with unspecified angina pectoris: Secondary | ICD-10-CM | POA: Insufficient documentation

## 2021-01-15 HISTORY — PX: TRANSTHORACIC ECHOCARDIOGRAM: SHX275

## 2021-01-15 LAB — ECHOCARDIOGRAM COMPLETE
Area-P 1/2: 2.24 cm2
S' Lateral: 3.2 cm

## 2021-01-15 MED ORDER — PERFLUTREN LIPID MICROSPHERE
1.0000 mL | INTRAVENOUS | Status: AC | PRN
  Administered 2021-01-15: 1 mL via INTRAVENOUS

## 2021-01-26 DIAGNOSIS — Z23 Encounter for immunization: Secondary | ICD-10-CM | POA: Diagnosis not present

## 2021-02-26 ENCOUNTER — Telehealth: Payer: Self-pay | Admitting: Cardiology

## 2021-02-26 ENCOUNTER — Encounter: Payer: Self-pay | Admitting: Cardiology

## 2021-02-26 ENCOUNTER — Other Ambulatory Visit: Payer: Self-pay

## 2021-02-26 ENCOUNTER — Ambulatory Visit (INDEPENDENT_AMBULATORY_CARE_PROVIDER_SITE_OTHER): Payer: Medicare Other | Admitting: Cardiology

## 2021-02-26 VITALS — BP 132/70 | HR 48 | Ht 71.0 in | Wt 213.6 lb

## 2021-02-26 DIAGNOSIS — I2510 Atherosclerotic heart disease of native coronary artery without angina pectoris: Secondary | ICD-10-CM

## 2021-02-26 DIAGNOSIS — Z9861 Coronary angioplasty status: Secondary | ICD-10-CM | POA: Diagnosis not present

## 2021-02-26 DIAGNOSIS — I255 Ischemic cardiomyopathy: Secondary | ICD-10-CM

## 2021-02-26 DIAGNOSIS — I252 Old myocardial infarction: Secondary | ICD-10-CM | POA: Diagnosis not present

## 2021-02-26 DIAGNOSIS — R001 Bradycardia, unspecified: Secondary | ICD-10-CM | POA: Diagnosis not present

## 2021-02-26 DIAGNOSIS — I480 Paroxysmal atrial fibrillation: Secondary | ICD-10-CM

## 2021-02-26 DIAGNOSIS — E785 Hyperlipidemia, unspecified: Secondary | ICD-10-CM

## 2021-02-26 DIAGNOSIS — I25119 Atherosclerotic heart disease of native coronary artery with unspecified angina pectoris: Secondary | ICD-10-CM | POA: Diagnosis not present

## 2021-02-26 DIAGNOSIS — I10 Essential (primary) hypertension: Secondary | ICD-10-CM

## 2021-02-26 MED ORDER — CARVEDILOL 3.125 MG PO TABS
3.1250 mg | ORAL_TABLET | Freq: Two times a day (BID) | ORAL | 3 refills | Status: DC
Start: 2021-02-26 — End: 2022-06-25

## 2021-02-26 MED ORDER — AMLODIPINE BESYLATE 10 MG PO TABS
10.0000 mg | ORAL_TABLET | Freq: Every day | ORAL | 3 refills | Status: DC
Start: 2021-02-26 — End: 2022-06-25

## 2021-02-26 MED ORDER — EZETIMIBE 10 MG PO TABS: 10.0000 mg | ORAL_TABLET | Freq: Every day | ORAL | 3 refills | Status: DC

## 2021-02-26 NOTE — Patient Instructions (Addendum)
Medication Instructions:   change in medications  Decrease carvedilol 3.125 mg one tablet twice a day   Increase Amlodipine 10 mg one tablet twice  daily   *If you need a refill on your cardiac medications before your next appointment, please call your pharmacy*   Lab Work:  lipid- fasting  Hepatic panel - Aug 2022  If you have labs (blood work) drawn today and your tests are completely normal, you will receive your results only by: Marland Kitchen MyChart Message (if you have MyChart) OR . A paper copy in the mail If you have any lab test that is abnormal or we need to change your treatment, we will call you to review the results.   Testing/Procedures:  Not needed  Follow-Up: At [facility], you and your health needs are our priority.  As part of our continuing mission to provide you with exceptional heart care, we have created designated Provider Care Teams.  These Care Teams include your primary Cardiologist (physician) and Advanced Practice Providers (APPs -  Physician Assistants and Nurse Practitioners) who all work together to provide you with the care you need, when you need it.     Your next appointment:   6 month(s)  The format for your next appointment:   In Person  Provider:   Glenetta Hew, MD

## 2021-02-26 NOTE — Telephone Encounter (Signed)
Returned call to patient who states that he was calling in to let Dr. Ellyn Hack know that he is currently taking Ezetimibe (Zetia) 10mg  Daily. Patient states that for some reason it did not show up on his medication list. Patient would like to know if Dr. Ellyn Hack would like for him to continue this and if so he will need a new prescription sent to his preferred pharmacy. Advised patient I would forward message to Dr. Ellyn Hack for him to review and advise.   Advised patient to call back to office with any issues, questions, or concerns. Patient verbalized understanding.

## 2021-02-26 NOTE — Telephone Encounter (Signed)
Pt was prescribed the medicine Ezetimibe 10mg  by Dr. Jory Sims on 03/29/20 but its not showing on his meds list, pt wants to know if he should still continue to take this medicine? Please advise

## 2021-02-26 NOTE — Progress Notes (Signed)
Primary Care Provider: [name], FNP Cardiologist: Travis Hew, MD Electrophysiologist: None  Clinic Note: Chief Complaint  Patient presents with   Follow-up    53-monthfollow-up-no further syncope.  Still active.   Coronary Artery Disease    No angina.  Follow-up echo results.     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  On stable regimen       CAD S/P percutaneous coronary angioplasty (Chronic)    Status post PCI to mid LAD (2018) and follow-up PCI to the mid to distal LCx (2019). No longer on Thienopyridine antiplatelet agent or aspirin as he continues to be on maintenance apixaban.       Relevant Medications   carvedilol (COREG) 3.125 MG tablet   amLODipine (NORVASC) 10 MG tablet   ezetimibe (ZETIA) 10 MG tablet   Other Relevant Orders   EKG 12-Lead (Completed)   Hepatic function panel   Lipid panel   Paroxysmal atrial fibrillation ([place]: CV2 Score - 4 (Age-26, HTN, CAD/MI) - On Eliquis (Chronic)    As far as I can tell, he has not had any breakthrough episodes since his MI.  Somewhat bradycardic today on current dose of beta-blocker.  Remains on Eliquis with no bleeding issues.  Plan: Reduce carvedilol dose to 1/2 tablet twice daily (3.125 mg) Continue Eliquis -> okay to hold 48 to 72 hours preop for surgery procedures (72 hours for neurologic or spinal procedures)       Relevant Medications   carvedilol (COREG) 3.125 MG tablet   amLODipine (NORVASC) 10 MG tablet   ezetimibe (ZETIA) 10 MG tablet   Other Relevant Orders   EKG 12-Lead (Completed)   Hepatic function panel   Lipid panel   Coronary artery disease involving native coronary artery of native heart with angina pectoris (HCC) - Primary (Chronic)    No further angina  after PCI to the LCx.  EF now back to normal without significant regional wall motion normalities.  Plan: Continue current dose of statin and ARB. Reducing carvedilol to 3.125 mg twice daily and increasing amlodipine to 10 mg daily. No longer on aspirin or Plavix as he is on Eliquis.  Plavix stopped because of nosebleeds.        Relevant Medications   carvedilol (COREG) 3.125 MG tablet   amLODipine (NORVASC) 10 MG tablet   ezetimibe (ZETIA) 10 MG tablet   Other Relevant Orders   EKG 12-Lead (Completed)   Hepatic function panel   Lipid panel   Essential hypertension (Chronic)    Blood pressure actually is pretty well controlled today.   With the plan to reduce carvedilol to 1/2 tablet twice daily, we will increase amlodipine to 10 mg daily and continue current dose of irbesartan 300 mg daily.       Relevant Medications   carvedilol (COREG) 3.125 MG tablet   amLODipine (NORVASC) 10 MG tablet   ezetimibe (ZETIA) 10 MG tablet   Other Relevant Orders   Hepatic function panel   Lipid panel   Hyperlipidemia with target low density lipoprotein (LDL) cholesterol less than 70 mg/dL (Chronic)    He has not had labs checked since August 2021 at which time LDL was 58.  Pretty close to absolute target of 50 or less.  A1c is 5.5.  Plan:  Continue current dose of rosuvastatin 40 mg daily.  Tolerating well.  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  Plan: Reduce carvedilol to 3.125 mg twice daily and increase amlodipine to 10 mg daily.       Relevant Orders   EKG 12-Lead (Completed)   Hepatic function panel   Lipid panel     ===================================  HPI:    Travis Key is a 80 y.o. male with a PMH CAD-PCI (see below), Paroxysmal A. fib, HTN, HLD, and episode of syncope in July 2021 thought related to seafood allergy, who presents today for 64-monthfollow-up.  09/2017: Anterior STEMI with PCI to LAD, known CTO RCA.   PCI of mid to distal LCx in 2019.  REllen Key last seen on September 11, 2020: He was doing fairly well.  No episodes of syncope or near syncope.  No active cardiac symptoms of CHF or angina.  Nothing to suggest that he is overly symptomatic of PAF or flutter.  No further nosebleeds.  Active/exercising-walking about 3 miles a day.  About the only thing to be noted was some mild end of day bilateral edema. 2D echo ordered to reassess EF.  Recent Hospitalizations: None  Reviewed  CV studies:    The following studies were reviewed today: (if available, images/films reviewed: From Epic Chart or Care Everywhere) TTE 01/15/2021: EF 60 to 65%.  No R WMA.  Mild LVH. ~Normal diastolic parameters.  Trivial MR.  Mild aortic valve sclerosis but no stenosis.--EF improved from 45 to 50% up to 60-65% when compared to July 2021.  Interval History:   RKaidan Harpsterreturns today overall doing fairly well.  He still walking about 4000 steps /day.  He tries to do the least 10,000 steps if he has a time.  He has not been as active recently because he has had to take care of his wife who has had some health issues. He has noted on occasion he feels a little bit sluggish, and tires a little easily.  He denies any chest pain or pressure but may get little bit exertional dyspnea just because of fatigue.  Cardiovascular Review of Symptoms: no chest pain or dyspnea on exertion positive for - edema and mild exercise intolerance, fatigue.  Feels sluggish. negative for - irregular heartbeat, orthopnea, palpitations, paroxysmal nocturnal dyspnea, rapid heart rate, shortness of breath, or Syncope/near syncope or  TIA/amaurosis fugax, claudication     REVIEWED OF SYSTEMS   Review of Systems  Constitutional:  Positive for malaise/fatigue (Mild exercise intolerance.  Lack of get up and go.  Feels somewhat sluggish). Negative for weight loss.  HENT:  Negative for congestion and nosebleeds.   Respiratory:  Negative for cough and shortness of breath.   Cardiovascular:  Positive for chest pain (He does have some left flank discomfort sometimes at night.  Often clears when he coughs.).       Per HPI  Gastrointestinal:  Negative for blood in stool, constipation and melena.  Genitourinary:  Negative for hematuria.  Musculoskeletal:  Negative for falls and joint pain.  Neurological:  Negative for  dizziness, focal weakness, weakness and headaches.  Psychiatric/Behavioral: Negative.     I have reviewed and (if needed) personally updated the patient's problem list, medications, allergies, past medical and surgical history, social and family history.   PAST MEDICAL HISTORY   Past Medical History:  Diagnosis Date   Adenomatous polyp of colon 08/2003   Anal fissure    CAD S/P LAD PCI for Antero-Posterior STEMI 09/2017   a) 12/18 STEMI PCI/DESx1 (Xience [place] 4.0 x 38 - 4.6 mm) mLAD, CTO of RCA, normal EF; b) 08/2018  90% m-dCx (DES PCI - Xience Sierra 3.5 x 15).  patent LAD stent, stable known non-dom RCA CTO.   Coronary artery disease    Diverticulosis    High cholesterol    History of myocardial infarction: Anterior - posterior MI (LAD lesion - PCI) 09/14/2017   a) Ant STEMI: STENT SIERRA 4.00 X 38 MM. --Postdilated to 4.6 mm p-m LAD;; b)  NSTEMI 08/2018 - STENT SIERRA 3.5 X 15 MM) m-dCx   Hypertension    Internal hemorrhoids    MI (myocardial infarction) ([place])    Paroxysmal A-fib ([place])     PAST SURGICAL HISTORY   Past Surgical History:  Procedure Laterality Date   CARDIOVERSION N/A 09/28/2018   Procedure: CARDIOVERSION;  Surgeon: Larey Dresser, MD;  Location: [facility] ENDOSCOPY;  Service:  Cardiovascular;  Laterality: N/A;   CORONARY ANGIOPLASTY WITH STENT PLACEMENT     CORONARY STENT INTERVENTION N/A 09/14/2017   Procedure: CORONARY STENT INTERVENTION;  Surgeon: Leonie Man, MD;  Location: [place] CV LAB;  Service: Cardiovascular:: p-mLAD 95% (tandem lesions 80&95%) --> PCI with 2 overlapping Xience [place] DES (distal 4.0 x 38 -> prox 4.0 x 12 --> post-dilated to ~4.6 mm.], 0% residual   CORONARY STENT INTERVENTION N/A 09/04/2018   Procedure: CORONARY STENT INTERVENTION;  Surgeon: Martinique, Peter M, MD;  Location: [place] [facility];  Service: Cardiovascular: m-dCx 90% (DES PCI Xience Sierra 3.5 x 15 -- 3.8 mm)   CORONARY/GRAFT ACUTE MI REVASCULARIZATION N/A 09/14/2017   Procedure: Coronary/Graft Acute MI Revascularization;  Surgeon: Leonie Man, MD;  Location: [place] [facility];  Service: Cardiovascular;  Laterality: N/A;   LEFT HEART CATH AND CORONARY ANGIOGRAPHY N/A 09/14/2017   Procedure: LEFT HEART CATH AND CORONARY ANGIOGRAPHY;  Surgeon: Leonie Man, MD;  Location: [place];  p-m LAD 85-95% tandem lesions (PCI), 0% residual.  OstD2 ~50%, m-dCx 60%, LPL1 ~50%. Small Non-dom RCA 100% CTO ~mid.  EF ~45-50% with apical anterior, apical & inferoapical HK.   LEFT HEART CATH AND CORONARY ANGIOGRAPHY N/A 09/04/2018   Procedure: LEFT HEART CATH AND CORONARY ANGIOGRAPHY;  Surgeon: Martinique, Peter M, MD;  Location: [place] [facility];  Service: Cardiovascular: m-dCx 90% (DES PCI). Patent p-mLAD (STENT SIERRA 4.00 X 38 MM. --Postdilated to 4.6 mm) stent with stable 50% ost D2  (jailed). known CTO of pRCA with R-R collaterals (non-dominant).     ROTATOR CUFF REPAIR Right    TRANSTHORACIC ECHOCARDIOGRAM  09/2017; 08/2018   a) In setting of anterior STEMI: EF 50 and 55%.  Moderate LVH.  Moderate HK of mid-apical anteroseptal wall.  Mild aortic valve calcification.;; b) EF up to 55-60%.  No R WMA.  Unable to assess diastolic function because of atrial fibrillation.  Mild  LA dilation.  Trivial MR.    TRANSTHORACIC ECHOCARDIOGRAM  04/2020   In the setting of syncope related to allergic reaction.  EF 45 to 50%.  Difficult to assess  regional wall motion.  Moderately reduced RV function.  Mild to moderate MAC.  Aortic sclerosis but no stenosis.   TRANSTHORACIC ECHOCARDIOGRAM  01/15/2021   EF 60 to 65%.  No R WMA.  Mild LVH. ~Normal diastolic parameters.  Trivial MR.  Mild aortic valve sclerosis but no stenosis.--EF improved from 45 to 50% up to 60-65% when compared to July 2021.   UMBILICAL HERNIA REPAIR      Immunization History  Administered Date(s) Administered   PFIZER(Purple Top)SARS-COV-2 Vaccination 11/08/2019, 11/29/2019    MEDICATIONS/ALLERGIES   Current Meds  Medication Sig   amLODipine (NORVASC) 10 MG tablet Take 1 tablet (10 mg total) by mouth daily.   ezetimibe (ZETIA) 10 MG tablet Take 1 tablet (10 mg total) by mouth daily.   irbesartan (AVAPRO) 300 MG tablet Take 1 tablet (300 mg total) by mouth daily.   nitroGLYCERIN (NITROSTAT) 0.4 MG SL tablet Place 1 tablet (0.4 mg total) under the tongue every 5 (five) minutes as needed.   sodium chloride (OCEAN) 0.65 % SOLN nasal spray Place 4 sprays into both nostrils every 4 (four) hours while awake.   [DISCONTINUED] amLODipine (NORVASC) 5 MG tablet Take 1 tablet (5 mg total) by mouth daily.   [DISCONTINUED] apixaban (ELIQUIS) 5 MG TABS tablet Take 1 tablet (5 mg total) by mouth 2 (two) times daily.   [DISCONTINUED] carvedilol (COREG) 6.25 MG tablet Take 1 tablet (6.25 mg total) by mouth 2 (two) times daily.   [DISCONTINUED] ezetimibe (ZETIA) 10 MG tablet Take 10 mg by mouth daily.   [DISCONTINUED] rosuvastatin (CRESTOR) 40 MG tablet Take 1 tablet (40 mg total) by mouth at bedtime.   Current Facility-Administered Medications for the 02/26/21 encounter (Office Visit) with Leonie Man, MD  Medication   zolpidem [facility]) tablet 5 mg  Apixaban refilled. Amlodipine 5 mg increased to 10 mg today&  Carvedilol decreased from 6.25 mg BID to 3.125 mg BID Unclear if he is taking rosuvastatin or not.  Allergies  Allergen Reactions   Crab [Shellfish Allergy] Anaphylaxis    SOCIAL HISTORY/FAMILY HISTORY   Reviewed in Epic:  Pertinent findings:  Social History   Tobacco Use   Smoking status: Former    Pack years: 0.00    Types: Cigarettes    Quit date: 03/02/1985    Years since quitting: 36.0   Smokeless tobacco: Never  Vaping Use   Vaping Use: Unknown  Substance Use Topics   Alcohol use: No    Alcohol/week: 0.0 standard drinks   Drug use: No   Social History   Social History Narrative   ** Merged History Encounter **        OBJCTIVE -PE, EKG, labs   Wt Readings from Last 3 Encounters:  02/26/21 213 lb 9.6 oz (96.9 kg)  09/11/20 215 lb 12.8 oz (97.9 kg)  05/19/20 211 lb (95.7 kg)    Physical Exam: BP 132/70   Pulse (!) 48   Ht _0  (1.803 m)   Wt 213 lb 9.6 oz (96.9 kg)   SpO2 97%   BMI 29.79 kg/m  Physical Exam Vitals reviewed.  Constitutional:      General: He is not in acute distress.    Appearance: Normal appearance. He is not ill-appearing or toxic-appearing.     Comments: Borderline obese.  Otherwise well-groomed and well-nourished.  Healthy-appearing.  HENT:     Head: Normocephalic and atraumatic.  Neck:     Vascular: No carotid bruit, hepatojugular reflux or JVD.  Cardiovascular:  Rate and Rhythm: Regular rhythm. Bradycardia present.     Pulses: Normal pulses.     Heart sounds: Normal heart sounds. No murmur heard.   No friction rub. No gallop.  Pulmonary:     Effort: Pulmonary effort is normal. No respiratory distress.     Breath sounds: Normal breath sounds. No wheezing, rhonchi or rales.  Chest:     Chest wall: No tenderness.  Musculoskeletal:        General: Swelling (Trivial bilateral ankle) present. Normal range of motion.     Cervical back: Normal range of motion and neck supple.  Neurological:     General: No focal deficit  present.     Mental Status: He is alert and oriented to person, place, and time.     Motor: No weakness.  Psychiatric:        Mood and Affect: Mood normal.        Behavior: Behavior normal.        Thought Content: Thought content normal.        Judgment: Judgment normal.       Adult ECG Report  Rate: 48 ;  Rhythm: sinus bradycardia and 1 AVB.  Otherwise normal axis, intervals, and durations ;   Narrative Interpretation: Stable EKG.  Recent Labs: Due for lab check. 01/05/2021:Cr 1.31, K+ 4.5.  Lab Results  Component Value Date   CHOL 123 05/19/2020   HDL 50 05/19/2020   LDLCALC 58 05/19/2020   TRIG 73 05/19/2020   CHOLHDL 2.5 05/19/2020   Lab Results  Component Value Date   CREATININE 1.31 (H) 01/05/2021   BUN 18 01/05/2021   NA 139 01/05/2021   K 4.5 01/05/2021   CL 102 01/05/2021   CO2 22 01/05/2021   CBC Latest Ref Rng & Units 04/30/2020 04/20/2020 04/19/2020  WBC 4.0 - 10.5 K/uL 11.2(H) 12.8(H) 12.9(H)  Hemoglobin 13.0 - 17.0 g/dL 13.2 13.1 13.3  Hematocrit 39.0 - 52.0 % 42.2 40.4 40.5  Platelets 150 - 400 K/uL 398 204 172    No results found for: TSH  ==================================================  COVID-19 Education: The signs and symptoms of COVID-19 were discussed with the patient and how to seek care for testing (follow up with PCP or arrange E-visit).    I spent a total of 37mnutes with the patient spent in direct patient consultation.  Additional time spent with chart review  / charting (studies, outside notes, etc): 13 min Total Time: 36 min   Current medicines are reviewed at length with the patient today.  (+/- concerns) n/a  This visit occurred during the SARS-CoV-2 public health emergency.  Safety protocols were in place, including screening questions prior to the visit, additional usage of staff PPE, and extensive cleaning of exam room while observing appropriate contact time as indicated for disinfecting solutions.  Notice: This dictation was  prepared with Dragon dictation along with smaller phrase technology. Any transcriptional errors that result from this process are unintentional and may not be corrected upon review.  Patient Instructions / Medication Changes & Studies & Tests Ordered   Patient Instructions  Medication Instructions:   change in medications  Decrease carvedilol 3.125 mg one tablet twice a day   Increase Amlodipine 10 mg one tablet twice  daily   *If you need a refill on your cardiac medications before your next appointment, please call your pharmacy*   Lab Work:  lipid- fasting  Hepatic panel - Aug 2022  If you have labs (blood work) drawn today and  your tests are completely normal, you will receive your results only by: MyChart Message (if you have MyChart) OR A paper copy in the mail If you have any lab test that is abnormal or we need to change your treatment, we will call you to review the results.   Testing/Procedures:  Not needed  Follow-Up: At [facility], you and your health needs are our priority.  As part of our continuing mission to provide you with exceptional heart care, we have created designated Provider Care Teams.  These Care Teams include your primary Cardiologist (physician) and Advanced Practice Providers (APPs -  Physician Assistants and Nurse Practitioners) who all work together to provide you with the care you need, when you need it.     Your next appointment:   6 month(s)  The format for your next appointment:   In Person  Provider:   Glenetta Hew, MD     Studies Ordered:   Orders Placed This Encounter  Procedures   Hepatic function panel   Lipid panel   EKG 12-Lead     Travis Key, M.D., M.S. Interventional Cardiologist   Pager # [phone] Phone # [phone] [address] [place], [place] [address]   Thank you for choosing Heartcare at [facility]!!

## 2021-02-26 NOTE — Telephone Encounter (Signed)
Reviewed with Dr Ellyn Hack . Per Dr Ellyn Hack, would like for patient to continue taking Zetia 10 mg.  information given to patient .  New prescription  E-sent to [facility] per patient request.

## 2021-03-05 ENCOUNTER — Other Ambulatory Visit: Payer: Self-pay | Admitting: Adult Health

## 2021-03-22 ENCOUNTER — Encounter: Payer: Self-pay | Admitting: Cardiology

## 2021-03-22 NOTE — Assessment & Plan Note (Signed)
Status post PCI to mid LAD (2018) and follow-up PCI to the mid to distal LCx (2019). No longer on Thienopyridine antiplatelet agent or aspirin as he continues to be on maintenance apixaban.

## 2021-03-22 NOTE — Assessment & Plan Note (Signed)
Recheck echo shows improved EF and no wall motion abnormalities.  On stable regimen

## 2021-03-22 NOTE — Assessment & Plan Note (Signed)
With him having fatigue and exercise intolerance, I am concerned about his resting heart rate of 48 bpm.  Plan: Reduce carvedilol to 3.125 mg twice daily and increase amlodipine to 10 mg daily.

## 2021-03-22 NOTE — Assessment & Plan Note (Addendum)
As far as I can tell, he has not had any breakthrough episodes since his MI.  Somewhat bradycardic today on current dose of beta-blocker.  Remains on Eliquis with no bleeding issues.  Plan: Reduce carvedilol dose to 1/2 tablet twice daily (3.125 mg)  Continue Eliquis -> okay to hold 48 to 72 hours preop for surgery procedures (72 hours for neurologic or spinal procedures)

## 2021-03-22 NOTE — Assessment & Plan Note (Signed)
Blood pressure actually is pretty well controlled today.    With the plan to reduce carvedilol to 1/2 tablet twice daily, we will increase amlodipine to 10 mg daily and continue current dose of irbesartan 300 mg daily.

## 2021-03-22 NOTE — Assessment & Plan Note (Signed)
He has not had labs checked since August 2021 at which time LDL was 58.  Pretty close to absolute target of 50 or less.  A1c is 5.5.  Plan: Continue current dose of rosuvastatin 40 mg daily.  Tolerating well.  Recheck labs

## 2021-03-22 NOTE — Assessment & Plan Note (Signed)
No further angina after PCI to the LCx.  EF now back to normal without significant regional wall motion normalities.  Plan: Continue current dose of statin and ARB.  Reducing carvedilol to 3.125 mg twice daily and increasing amlodipine to 10 mg daily.  No longer on aspirin or Plavix as he is on Eliquis.  Plavix stopped because of nosebleeds.

## 2021-06-07 ENCOUNTER — Other Ambulatory Visit: Payer: Self-pay | Admitting: Adult Health

## 2021-06-07 NOTE — Telephone Encounter (Signed)
Rx(s) sent to pharmacy electronically.  

## 2021-09-28 DIAGNOSIS — Z23 Encounter for immunization: Secondary | ICD-10-CM | POA: Diagnosis not present

## 2021-10-16 DIAGNOSIS — Z8679 Personal history of other diseases of the circulatory system: Secondary | ICD-10-CM | POA: Diagnosis not present

## 2021-10-16 DIAGNOSIS — T7849XS Other allergy, sequela: Secondary | ICD-10-CM | POA: Diagnosis not present

## 2021-10-25 ENCOUNTER — Telehealth: Payer: Self-pay

## 2021-10-25 NOTE — Telephone Encounter (Signed)
Patient came to office to have paperwork competed for [place] [employer]. Informed him Dr. Ellyn Hack will return to clinic tomorrow; that I'll put the form in Dr. Allison Quarry mailbox

## 2021-10-30 NOTE — Telephone Encounter (Signed)
Patient sates he came to office today to pick form.  Informed patient  will  probably have form completed tomorrow will call when ready . Patient verbalized understanding.

## 2021-11-08 NOTE — Telephone Encounter (Signed)
Late entry  Patient picked form  on 10/26/21 , information faxed as requested.

## 2022-01-29 ENCOUNTER — Other Ambulatory Visit: Payer: Self-pay | Admitting: Adult Health

## 2022-03-11 ENCOUNTER — Other Ambulatory Visit: Payer: Self-pay | Admitting: Cardiology

## 2022-03-20 ENCOUNTER — Other Ambulatory Visit: Payer: Self-pay | Admitting: Cardiology

## 2022-03-20 DIAGNOSIS — I480 Paroxysmal atrial fibrillation: Secondary | ICD-10-CM

## 2022-03-20 NOTE — Telephone Encounter (Signed)
Prescription refill request for Eliquis received. Indication:Afib Last office visit:needs appt VVO:HYWVP labs Age: 81 Weight:96.9 kg  Prescription refilled

## 2022-04-03 ENCOUNTER — Other Ambulatory Visit: Payer: Self-pay

## 2022-04-03 MED ORDER — EZETIMIBE 10 MG PO TABS: 10.0000 mg | ORAL_TABLET | Freq: Every day | ORAL | 1 refills | Status: DC

## 2022-04-08 ENCOUNTER — Other Ambulatory Visit: Payer: Self-pay | Admitting: Cardiology

## 2022-05-07 ENCOUNTER — Other Ambulatory Visit: Payer: Self-pay | Admitting: Cardiology
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
## 2022-05-09 IMAGING — DX DG CHEST 1V PORT
1 series · 1 of 1 positions shown · non-contrast
Comparison: 04/16/2020

CLINICAL DATA: Respiratory failure, pneumonia

EXAM:
PORTABLE CHEST 1 VIEW

[chest ap]
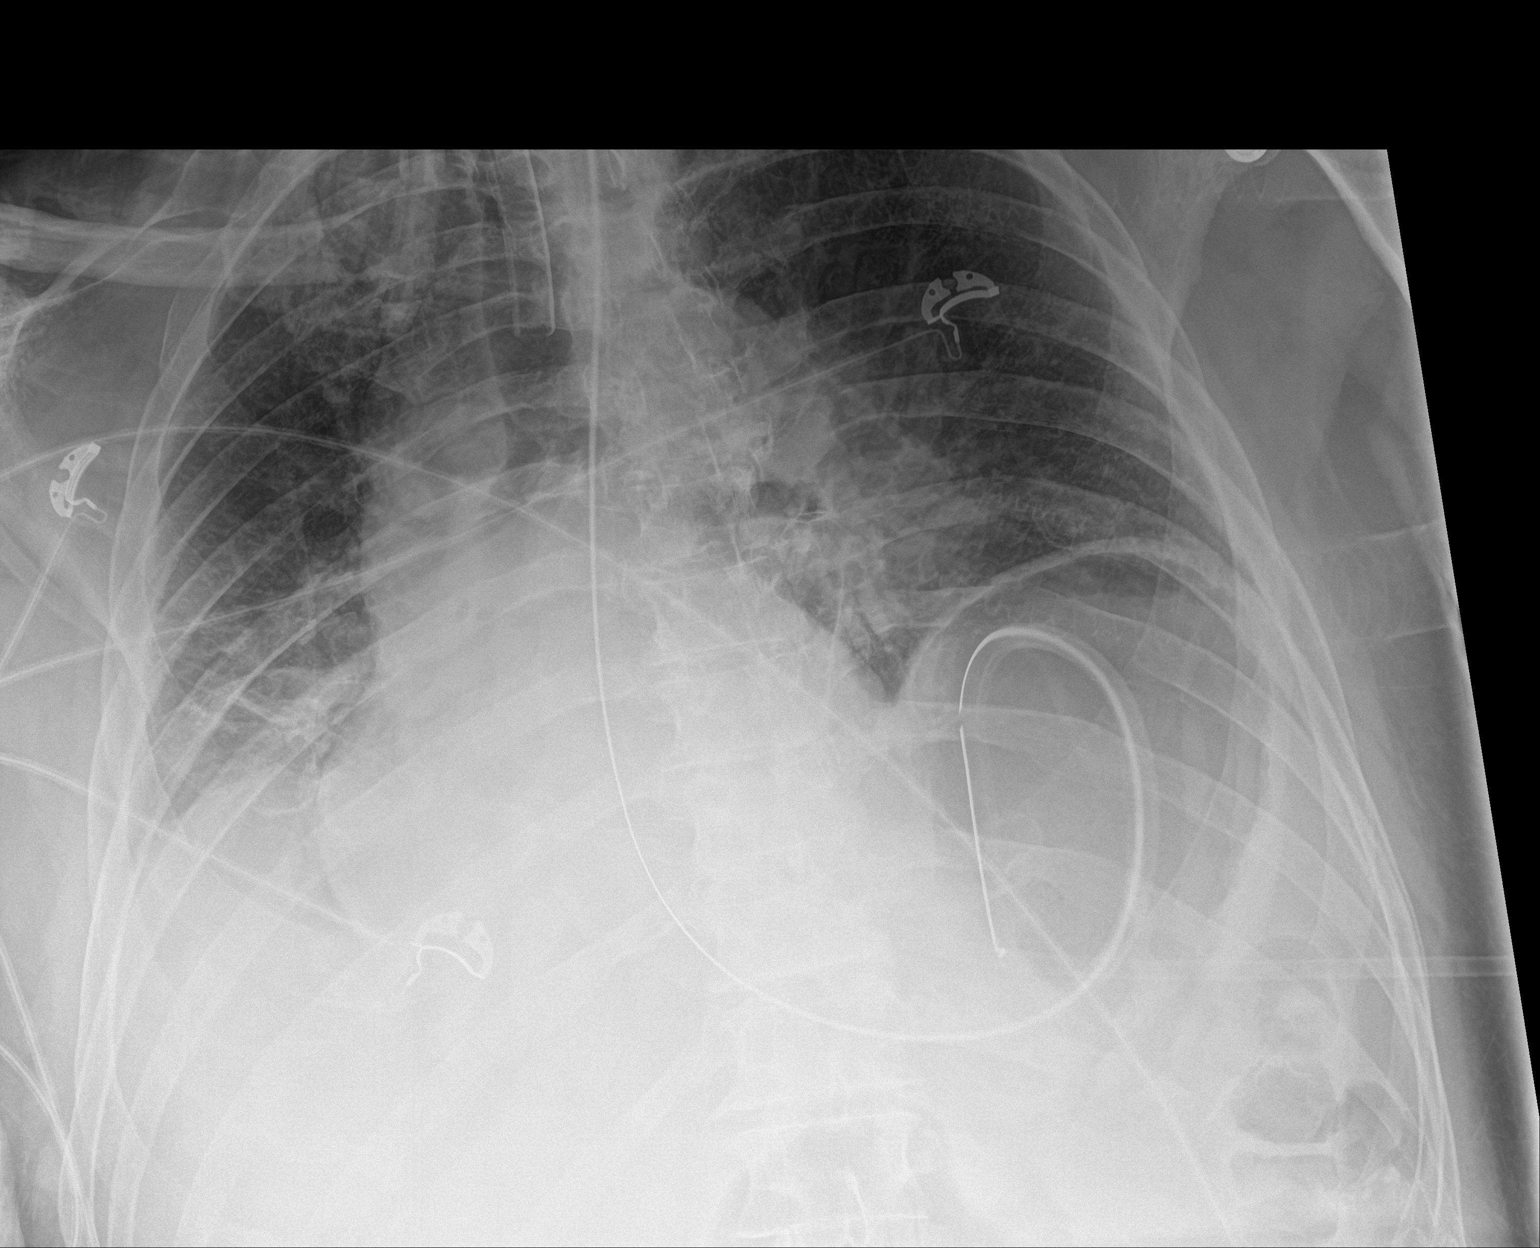

[1 of 1 positions shown; findings below may reference images not displayed]

FINDINGS: Exam is rotated to the right. Endotracheal tube 4.5 cm above the
carina. NG tube within the proximal stomach. Low lung volumes with
bibasilar atelectasis. Stable cardiomegaly and vascular congestion.
No significant collapse or consolidation. No large effusion or
pneumothorax.
IMPRESSION: Rotated low volume exam.  Bibasilar atelectasis.

## 2022-05-20 ENCOUNTER — Other Ambulatory Visit: Payer: Self-pay | Admitting: Cardiology

## 2022-05-20 DIAGNOSIS — I480 Paroxysmal atrial fibrillation: Secondary | ICD-10-CM

## 2022-05-20 MED ORDER — APIXABAN 5 MG PO TABS: 5.0000 mg | ORAL_TABLET | Freq: Two times a day (BID) | ORAL | 0 refills | Status: DC

## 2022-05-20 NOTE — Telephone Encounter (Addendum)
Eliquis '5mg'$  refill request received. Patient is 81 years old, weight-96.9kg, Crea-1.31 on 01/05/2021-[facility]ED[facility] LABS, Diagnosis-Afib, and last seen by Dr. Ellyn Hack on 02/26/2021-NEEDS APPT. Dose is appropriate based on dosing criteria.  WILL SEND A MESSAGE TO SCHEDULERS FOR AN APPT.  Pt has an appt with Coletta Memos on 06/25/2022, will send in a limited refill and add labs. Order labs and released; requisition in basket.

## 2022-06-03 ENCOUNTER — Other Ambulatory Visit: Payer: Self-pay | Admitting: Cardiology

## 2022-06-24 NOTE — Progress Notes (Unsigned)
Cardiology Office Note:    Date:  06/25/2022   ID:  Travis Key, DOB 03-07-1941, [name] [id number]  PCP:  Travis Key, [place] Providers Cardiologist:  Glenetta Hew, MD     Referring MD: Travis Key,*   CC: Here for follow-up  History of Present Illness:    Travis Key is a 81 y.o. male with a hx of the following:  Hypertension CAD, s/p anterior-posterior MI, PCI to mid LAD in 2018, follow-up PCI to mid to distal left circumflex in 2019 Paroxysmal A-fib-on Eliquis Ischemic cardiomyopathy History of frequent PVCs Hyperlipidemia Daytime sleepiness History of syncope and collapse History of bradycardia  Had an episode of syncope in July 2021 thought to be due to a seafood allergy, last seen by Dr. Ellyn Key in May 2022.  Was doing well from a cardiac perspective and walking/exercising about 3 miles a day.  Said he did note some mild bilateral lower extremity edema at end of the day. 2D Echo revealed LVEF 60 to 65%, no wall motion abnormalities, mild sclerosis of aortic valve but no stenosis, this finding was improved from previous echocardiogram which showed LVEF 45 to 50%. He was bradycardic, 48 bpm in the office with Dr. Ellyn Key, carvedilol dose reduced and amlodipine increased.   Today he presents for follow-up.  He is doing well from a cardiac perspective.  Still very active, to 10,000 steps last week. Enjoys exercising and doing Sodoku. Blood pressure well controlled at home.  Stated blood pressure little elevated in office today-did not take blood pressure medicine this morning.  Denies any chest pain, shortness of breath, palpitations, syncope, presyncope, dizziness, orthopnea, PND, any acute bleeding, or claudication.  Drinks minimal caffeine and is about to switch to decaf.  STOP-BANG score today is 4.  He defers sleep study at this time.  He is requesting medication refills, fasting today and ready for lab work to be drawn.  Past Medical History:   Diagnosis Date   Adenomatous polyp of colon 08/2003   Anal fissure    CAD S/P LAD PCI for Antero-Posterior STEMI 09/2017   a) 12/18 STEMI PCI/DESx1 (Xience Sierra 4.0 x 38 - 4.6 mm) mLAD, CTO of RCA, normal EF; b) 08/2018  90% m-dCx (DES PCI - Xience Sierra 3.5 x 15).  patent LAD stent, stable known non-dom RCA CTO.   Coronary artery disease    Diverticulosis    High cholesterol    History of myocardial infarction: Anterior - posterior MI (LAD lesion - PCI) 09/14/2017   a) Ant STEMI: STENT SIERRA 4.00 X 38 MM. --Postdilated to 4.6 mm p-m LAD;; b)  NSTEMI 08/2018 - STENT SIERRA 3.5 X 15 MM) m-dCx   Hypertension    Internal hemorrhoids    MI (myocardial infarction) ([place])    Paroxysmal A-fib [facility])     Past Surgical History:  Procedure Laterality Date   CARDIOVERSION N/A 09/28/2018   Procedure: CARDIOVERSION;  Surgeon: Larey Dresser, MD;  Location: [facility] ENDOSCOPY;  Service: Cardiovascular;  Laterality: N/A;   CORONARY ANGIOPLASTY WITH STENT PLACEMENT     CORONARY STENT INTERVENTION N/A 09/14/2017   Procedure: CORONARY STENT INTERVENTION;  Surgeon: Leonie Man, MD;  Location: [place] CV LAB;  Service: Cardiovascular:: p-mLAD 95% (tandem lesions 80&95%) --> PCI with 2 overlapping Xience [place] DES (distal 4.0 x 38 -> prox 4.0 x 12 --> post-dilated to ~4.6 mm.], 0% residual   CORONARY STENT INTERVENTION N/A 09/04/2018   Procedure: CORONARY STENT INTERVENTION;  Surgeon: Martinique, Peter M, MD;  Location: [place] CV LAB;  Service: Cardiovascular: m-dCx 90% (DES PCI Xience Sierra 3.5 x 15 -- 3.8 mm)   CORONARY/GRAFT ACUTE MI REVASCULARIZATION N/A 09/14/2017   Procedure: Coronary/Graft Acute MI Revascularization;  Surgeon: Leonie Man, MD;  Location: [place] CV LAB;  Service: Cardiovascular;  Laterality: N/A;   LEFT HEART CATH AND CORONARY ANGIOGRAPHY N/A 09/14/2017   Procedure: LEFT HEART CATH AND CORONARY ANGIOGRAPHY;  Surgeon: Leonie Man, MD;  Location: [place]  CV LAB;  p-m LAD 85-95% tandem lesions (PCI), 0% residual.  OstD2 ~50%, m-dCx 60%, LPL1 ~50%. Small Non-dom RCA 100% CTO ~mid.  EF ~45-50% with apical anterior, apical & inferoapical HK.   LEFT HEART CATH AND CORONARY ANGIOGRAPHY N/A 09/04/2018   Procedure: LEFT HEART CATH AND CORONARY ANGIOGRAPHY;  Surgeon: Martinique, Peter M, MD;  Location: [place] CV LAB;  Service: Cardiovascular: m-dCx 90% (DES PCI). Patent p-mLAD (STENT SIERRA 4.00 X 38 MM. --Postdilated to 4.6 mm) stent with stable 50% ost D2  (jailed). known CTO of pRCA with R-R collaterals (non-dominant).     ROTATOR CUFF REPAIR Right    TRANSTHORACIC ECHOCARDIOGRAM  09/2017; 08/2018   a) In setting of anterior STEMI: EF 50 and 55%.  Moderate LVH.  Moderate HK of mid-apical anteroseptal wall.  Mild aortic valve calcification.;; b) EF up to 55-60%.  No R WMA.  Unable to assess diastolic function because of atrial fibrillation.  Mild LA dilation.  Trivial MR.    TRANSTHORACIC ECHOCARDIOGRAM  04/2020   In the setting of syncope related to allergic reaction.  EF 45 to 50%.  Difficult to assess regional wall motion.  Moderately reduced RV function.  Mild to moderate MAC.  Aortic sclerosis but no stenosis.   TRANSTHORACIC ECHOCARDIOGRAM  01/15/2021   EF 60 to 65%.  No R WMA.  Mild LVH. ~Normal diastolic parameters.  Trivial MR.  Mild aortic valve sclerosis but no stenosis.--EF improved from 45 to 50% up to 60-65% when compared to July 2021.   UMBILICAL HERNIA REPAIR      Current Medications: Current Meds  Medication Sig   amLODipine (NORVASC) 10 MG tablet Take 1 tablet (10 mg total) by mouth daily.   apixaban (ELIQUIS) 5 MG TABS tablet Take 1 tablet (5 mg total) by mouth 2 (two) times daily. KEEP UPCOMING CARDIOLOGY APPOINTMENT   carvedilol (COREG) 3.125 MG tablet Take 1 tablet (3.125 mg total) by mouth 2 (two) times daily.   ezetimibe (ZETIA) 10 MG tablet TAKE 1 TABLET BY MOUTH DAILY   irbesartan (AVAPRO) 300 MG tablet TAKE 1 TABLET EVERY DAY    nitroGLYCERIN (NITROSTAT) 0.4 MG SL tablet Place 1 tablet (0.4 mg total) under the tongue every 5 (five) minutes as needed.   rosuvastatin (CRESTOR) 40 MG tablet Take 1 tablet (40 mg total) by mouth at bedtime.   sodium chloride (OCEAN) 0.65 % SOLN nasal spray Place 4 sprays into both nostrils every 4 (four) hours while awake.    Allergies:   Otho Darner allergy]   Social History   Socioeconomic History   Marital status: Widowed    Spouse name: Not on file   Number of children: 4   Years of education: Not on file   Highest education level: Not on file  Occupational History   Occupation: retired Airline pilot  Tobacco Use   Smoking status: Former    Types: Cigarettes    Quit date: 03/02/1985    Years since quitting: 29.3  Smokeless tobacco: Never  Vaping Use   Vaping Use: Unknown  Substance and Sexual Activity   Alcohol use: No    Alcohol/week: 0.0 standard drinks of alcohol   Drug use: No   Sexual activity: Not on file  Other Topics Concern   Not on file  Social History Narrative   ** Merged History Encounter **       Social Determinants of Health   Financial Resource Strain: Not on file  Food Insecurity: Not on file  Transportation Needs: Not on file  Physical Activity: Insufficiently Active (11/20/2017)   Exercise Vital Sign    Days of Exercise per Week: 4 days    Minutes of Exercise per Session: 30 min  Stress: No Stress Concern Present (11/20/2017)   [place]    Feeling of Stress : Not at all  Social Connections: Not on file     Family History: The patient's family history includes Diabetes in his mother; Liver disease in his mother.  ROS:   Review of Systems  Constitutional: Negative.   HENT: Negative.         Chronic history of deviated septum, history of sinus issues with this.  Eyes: Negative.   Respiratory: Negative.    Cardiovascular: Negative.   Gastrointestinal: Negative.    Genitourinary: Negative.   Musculoskeletal: Negative.   Skin: Negative.   Neurological: Negative.   Endo/Heme/Allergies: Negative.   Psychiatric/Behavioral: Negative.     Please see the history of present illness.    All other systems reviewed and are negative.  EKGs/Labs/Other Studies Reviewed:    The following studies were reviewed today:   EKG:  EKG is ordered today.  The ekg ordered today demonstrates sinus rhythm, 66 bpm, frequent PVCs (total of 4 on twelve-lead EKG), otherwise nothing acute.   2D echocardiogram on January 15, 2021:  1. Left ventricular ejection fraction, by estimation, is 60 to 65%. The  left ventricle has normal function. The left ventricle has no regional  wall motion abnormalities. There is mild left ventricular hypertrophy.  Left ventricular diastolic parameters  were normal.   2. Right ventricular systolic function is normal. The right ventricular  size is normal.   3. The mitral valve is normal in structure. Trivial mitral valve  regurgitation.   4. Aortic valve regurgitation is not visualized. Mild aortic valve  sclerosis is present, with no evidence of aortic valve stenosis.   5. The inferior vena cava is normal in size with greater than 50%  respiratory variability, suggesting right atrial pressure of 3 mmHg.   Comparison(s): The left ventricular function has improved. 04/17/20 EF  45-50%.   Left heart cath and coronary angiography on September 04, 2018: Ost 2nd Diag lesion is 50% stenosed. Mid Cx to Dist Cx lesion is 90% stenosed. Prox RCA to Mid RCA lesion is 100% stenosed. Previously placed Prox LAD to Mid LAD stent (unknown type) is widely patent. Post intervention, there is a 0% residual stenosis. A drug-eluting stent was successfully placed using a STENT SIERRA 3.50 X 15 MM. LV end diastolic pressure is normal.   1. 2 vessel obstructive CAD.    - continued patency of prior LAD stents    - 90% mid LCx- new from prior and the culprit  lesion    - 100% CTO of a nondominant RCA 2. Normal LVEDP 3. Successful PCI of the LCx with DES x 1.   Plan: Echo to assess LV function. DAPT. If  no bleeding can start anticoagulation tomorrow.    Recommend to resume Apixaban, at currently prescribed dose and frequency, on 09/05/18.  Recommend concurrent antiplatelet therapy of Aspirin 79m daily for 1 month and Clopidogrel 72mdaily for 12 months. Left heart cath on September 14, 2017: Prox LAD to Mid LAD lesion is 95% stenosed. (Tandem 80-95% stenosis) A drug-eluting stent was successfully placed using a STENT SIERRA 4.00 X 38 MM. --Postdilated to 4.6 mm A second overlapping drug-eluting stent was successfully placed using a STENT SIERRA 4.00 X 12 MM. --Postdilated to 4.6 mm Post intervention, there is a 0% residual stenosis. _______________________________________________ OsColon Flatterynd Diag lesion is 50% stenosed. Prox RCA to Mid RCA lesion is 100% stenosed. CTO of nondominant RCA with bridging collaterals Mid Cx to Dist Cx lesion is 60% stenosed. 1st LPL lesion is 50% stenosed. There is mild to moderate left ventricular systolic dysfunction. The left ventricular ejection fraction is 45-50% by visual estimate. With apical anterior, apical and inferoapical hypo-a kinesis LV end diastolic pressure is moderately elevated.   Severe two-vessel disease with almost subtotally occluded proximal mid wraparound LAD as a likely culprit lesion along with a likely CTO of the still large caliber nondominant right coronary artery.  Moderate disease of the dominant circumflex. Tandem 80 and 95% lesions in the proximal to mid LAD were treated with 2 overlapping DES stents as noted. The subtle EKG changes noted leading to diagnosis of acute posterior infarct is likely related to this being a wraparound LAD that was potentially intermittently occlusive.     Plan: Admit to CCU for monitoring and TR band removal. Dual antiplatelet therapy for minimum 1  year. Aggressive risk factor modification: We will increase Crestor dose to 40 mg, convert from HCTZ to carvedilol and continue ARB Check fasting lipid level and A1c We will order 2D echocardiogram for baseline.   Depending on how he does clinically, consider fast-track discharge   Recent Labs: No results found for requested labs within last 365 days.  Recent Lipid Panel    Component Value Date/Time   CHOL 123 05/19/2020 0940   TRIG 73 05/19/2020 0940   HDL 50 05/19/2020 0940   CHOLHDL 2.5 05/19/2020 0940   CHOLHDL 3.4 09/15/2017 0012   VLDL 18 09/15/2017 0012   LDLCALC 58 05/19/2020 0940     Risk Assessment/Calculations:    CHA2DS2-VASc Score = 5  This indicates a 7.2% annual risk of stroke. The patient's score is based upon: CHF History: 1 HTN History: 1 Diabetes History: 0 Stroke History: 0 Vascular Disease History: 1 Age Score: 2 Gender Score: 0    STOP-Bang Score:  4  {   Physical Exam:    VS:  BP 138/84 (BP Location: Left Arm, Patient Position: Sitting)   Pulse 66   Ht _0  (1.803 m)   Wt 208 lb 9.6 oz (94.6 kg)   BMI 29.09 kg/m     Wt Readings from Last 3 Encounters:  06/25/22 208 lb 9.6 oz (94.6 kg)  02/26/21 213 lb 9.6 oz (96.9 kg)  09/11/20 215 lb 12.8 oz (97.9 kg)     GEN: Well nourished, well developed in no acute distress HEENT: Normal NECK: No JVD; No carotid bruits CARDIAC: Regular rate, with occasional early beats noted during exam (most likely PVC's), no murmurs, rubs, gallops; 2+ peripheral pulses throughout, strong and equal bilaterally RESPIRATORY:  Clear and diminished to auscultation without rales, wheezing or rhonchi  MUSCULOSKELETAL:  No edema; No deformity  SKIN: Warm and  dry NEUROLOGIC:  Alert and oriented x 3 PSYCHIATRIC:  Normal affect   ASSESSMENT:    1. Frequent PVCs   2. AF (paroxysmal atrial fibrillation) (HCC)   3. Current use of long term anticoagulation   4. History of bradycardia   5. Coronary artery disease  involving native heart without angina pectoris, unspecified vessel or lesion type   6. Hyperlipidemia with target low density lipoprotein (LDL) cholesterol less than 70 mg/dL   7. Ischemic cardiomyopathy   8. Hypertension, unspecified type    PLAN:    In order of problems listed above:   Frequent PVC's - current, stable Noted on twelve-lead EKG today.  Completely asymptomatic.  Denies any tachycardia or palpitations.  Defers monitor at this time.  Recommended Kardia mobile device and monitor this at home for the next 2 weeks.  If notices PVC's at home, he should let us know and we will consider monitor at that time.  Labs today including TSH, mag, CMET, and CBC.  Switch carvedilol 3.125 mg twice daily to metoprolol succinate 25 mg daily. Encouraged caffeine reduction.  Paroxysmal atrial fibrillation, long-term anticoagulation - chronic, stable Denies any recurrent A-fib episodes, tachycardia, or palpitations.  CHA2DS2-VASc score is 5.  He is on appropriate dosing of Eliquis and is requesting assistance with cost.  He was given 2 sample boxes from our medication sample room. Continue current medication regimen and switch beta-blocker as mentioned above.  Will obtain CBC, TSH, Mag, CMET.   History of bradycardia - past,  Last visit Coreg dose was decreased due to bradycardia.  Has a smart watch to monitor his heart rate at home.  Denies any low heart rates.  Switch beta-blocker as mentioned above.  Obtain the following blood work: CBC, TSH, mag, CMET.  Kardia mobile monitoring at home encouraged.  4.  Coronary artery disease, status post history of PCI to mid LAD in 2018, follow-up PCI to mid to distal left circumflex in 2019, hyperlipidemia with LDL goal less than 70 - chronic, stable Stable with no anginal symptoms. No indication for ischemic evaluation.  Last LDL 58 in August 2021.  He is due for labs.  Obtain FLP and CMET today along with labs mentioned above.  Continue current medication  regimen.   4.  Ischemic cardiomyopathy - chronic, improved Echo from 2022 revealed 60 to 65%, improved from previous study was 45 to 50%. Euvolemic and well compensated on exam. Low sodium diet, fluid restriction <2L, and daily weights encouraged. Educated to contact our office for weight gain of 2 lbs overnight or 5 lbs in one week.  5.  Hypertension - chronic, stable Blood pressure minimally elevated on exam, 138/84, states did not take BP medicine this morning.  Well-controlled BPs at home.  Switch beta-blocker as mentioned above.  We will obtain CBC, CMET, magnesium, and TSH.  6.  Disposition: Follow-up with Dr. Ellyn Key in 6 months or sooner if anything changes    Medication Adjustments/Labs and Tests Ordered: Current medicines are reviewed at length with the patient today.  Concerns regarding medicines are outlined above.  Orders Placed This Encounter  Procedures   CBC   Lipid panel   Comprehensive metabolic panel   Magnesium   TSH   EKG 12-Lead   Meds ordered this encounter  Medications   DISCONTD: amLODipine (NORVASC) 10 MG tablet    Sig: Take 1 tablet (10 mg total) by mouth daily.    Dispense:  190 tablet    Refill:  3  DISCONTD: apixaban (ELIQUIS) 5 MG TABS tablet    Sig: Take 1 tablet (5 mg total) by mouth 2 (two) times daily.    Dispense:  180 tablet    Refill:  3   ezetimibe (ZETIA) 10 MG tablet    Sig: Take 1 tablet (10 mg total) by mouth daily.    Dispense:  90 tablet    Refill:  3    Patient must keep appointment for future refills   DISCONTD: irbesartan (AVAPRO) 300 MG tablet    Sig: Take 1 tablet (300 mg total) by mouth daily.    Dispense:  90 tablet    Refill:  3   DISCONTD: rosuvastatin (CRESTOR) 40 MG tablet    Sig: Take 1 tablet (40 mg total) by mouth at bedtime.    Dispense:  90 tablet    Refill:  3   nitroGLYCERIN (NITROSTAT) 0.4 MG SL tablet    Sig: Place 1 tablet (0.4 mg total) under the tongue every 5 (five) minutes as needed.    Dispense:   25 tablet    Refill:  6   DISCONTD: metoprolol succinate (TOPROL XL) 25 MG 24 hr tablet    Sig: Take 1 tablet (25 mg total) by mouth daily.    Dispense:  90 tablet    Refill:  3   amLODipine (NORVASC) 10 MG tablet    Sig: Take 1 tablet (10 mg total) by mouth daily.    Dispense:  190 tablet    Refill:  3   apixaban (ELIQUIS) 5 MG TABS tablet    Sig: Take 1 tablet (5 mg total) by mouth 2 (two) times daily.    Dispense:  180 tablet    Refill:  3   irbesartan (AVAPRO) 300 MG tablet    Sig: Take 1 tablet (300 mg total) by mouth daily.    Dispense:  90 tablet    Refill:  3   metoprolol succinate (TOPROL XL) 25 MG 24 hr tablet    Sig: Take 1 tablet (25 mg total) by mouth daily.    Dispense:  90 tablet    Refill:  3   rosuvastatin (CRESTOR) 40 MG tablet    Sig: Take 1 tablet (40 mg total) by mouth at bedtime.    Dispense:  90 tablet    Refill:  3    Patient Instructions  Medication Instructions:   Stop carvedilol   Start taking Metoprolol succinate 25 mg daily    Refilled medication -everything went to [facility] except - zetia , NTG went to [facility] per patient request.  *If you need a refill on your cardiac medications before your next appointment, please call your pharmacy*   Lab Work:  Cmp Cbc Lipid Tsh magnesium  If you have labs (blood work) drawn today and your tests are completely normal, you will receive your results only by: [place] (if you have MyChart) OR A paper copy in the mail If you have any lab test that is abnormal or we need to change your treatment, we will call you to review the results.   Testing/Procedures:  Not needed  Follow-Up: At [facility], you and your health needs are our priority.  As part of our continuing mission to provide you with exceptional heart care, we have created designated Provider Care Teams.  These Care Teams include your primary Cardiologist (physician) and Advanced Practice Providers (APPs -   Physician Assistants and Nurse Practitioners) who all work together to provide you with  the care you need, when you need it.  We recommend signing up for the patient portal called "MyChart".  Sign up information is provided on this After Visit Summary.  MyChart is used to connect with patients for Virtual Visits (Telemedicine).  Patients are able to view lab/test results, encounter notes, upcoming appointments, etc.  Non-urgent messages can be sent to your provider as well.   To learn more about what you can do with MyChart, go to [email or web address].    Your next appointment:   6 month(s)  The format for your next appointment:   In Person  Provider:   Glenetta Hew, MD    Other Instructions    Monitor heart rate with Brantley Persons    Signed, Finis Bud, NP  06/25/2022 11:53 AM    [place]

## 2022-06-25 ENCOUNTER — Encounter: Payer: Self-pay | Admitting: Nurse Practitioner

## 2022-06-25 ENCOUNTER — Ambulatory Visit: Payer: Medicare Other | Attending: General Practice | Admitting: Nurse Practitioner

## 2022-06-25 VITALS — BP 138/84 | HR 66 | Ht 71.0 in | Wt 208.6 lb

## 2022-06-25 DIAGNOSIS — I493 Ventricular premature depolarization: Secondary | ICD-10-CM | POA: Diagnosis not present

## 2022-06-25 DIAGNOSIS — E785 Hyperlipidemia, unspecified: Secondary | ICD-10-CM | POA: Diagnosis not present

## 2022-06-25 DIAGNOSIS — Z87898 Personal history of other specified conditions: Secondary | ICD-10-CM | POA: Diagnosis not present

## 2022-06-25 DIAGNOSIS — I480 Paroxysmal atrial fibrillation: Secondary | ICD-10-CM | POA: Insufficient documentation

## 2022-06-25 DIAGNOSIS — Z7901 Long term (current) use of anticoagulants: Secondary | ICD-10-CM | POA: Diagnosis not present

## 2022-06-25 DIAGNOSIS — I2510 Atherosclerotic heart disease of native coronary artery without angina pectoris: Secondary | ICD-10-CM

## 2022-06-25 DIAGNOSIS — I10 Essential (primary) hypertension: Secondary | ICD-10-CM | POA: Insufficient documentation

## 2022-06-25 DIAGNOSIS — I255 Ischemic cardiomyopathy: Secondary | ICD-10-CM | POA: Insufficient documentation

## 2022-06-25 MED ORDER — APIXABAN 5 MG PO TABS: 5.0000 mg | ORAL_TABLET | Freq: Two times a day (BID) | ORAL | 3 refills | Status: DC

## 2022-06-25 MED ORDER — AMLODIPINE BESYLATE 10 MG PO TABS: 10.0000 mg | ORAL_TABLET | Freq: Every day | ORAL | 3 refills | Status: DC

## 2022-06-25 MED ORDER — IRBESARTAN 300 MG PO TABS: 300.0000 mg | ORAL_TABLET | Freq: Every day | ORAL | 3 refills | Status: DC

## 2022-06-25 MED ORDER — NITROGLYCERIN 0.4 MG SL SUBL: 0.4000 mg | SUBLINGUAL_TABLET | SUBLINGUAL | 6 refills | Status: DC | PRN

## 2022-06-25 MED ORDER — ROSUVASTATIN CALCIUM 40 MG PO TABS: 40.0000 mg | ORAL_TABLET | Freq: Every day | ORAL | 3 refills | Status: DC

## 2022-06-25 MED ORDER — METOPROLOL SUCCINATE ER 25 MG PO TB24: 25.0000 mg | ORAL_TABLET | Freq: Every day | ORAL | 3 refills | Status: DC

## 2022-06-25 MED ORDER — EZETIMIBE 10 MG PO TABS: 10.0000 mg | ORAL_TABLET | Freq: Every day | ORAL | 3 refills | Status: DC

## 2022-06-25 NOTE — Patient Instructions (Addendum)
Medication Instructions:   Stop carvedilol   Start taking Metoprolol succinate 25 mg daily    Refilled medication -everything went to [facility] except - zetia , NTG went to [facility] per patient request.  *If you need a refill on your cardiac medications before your next appointment, please call your pharmacy*   Lab Work:  Cmp Cbc Lipid Tsh magnesium  If you have labs (blood work) drawn today and your tests are completely normal, you will receive your results only by: [place] (if you have MyChart) OR A paper copy in the mail If you have any lab test that is abnormal or we need to change your treatment, we will call you to review the results.   Testing/Procedures:  Not needed  Follow-Up: At [facility], you and your health needs are our priority.  As part of our continuing mission to provide you with exceptional heart care, we have created designated Provider Care Teams.  These Care Teams include your primary Cardiologist (physician) and Advanced Practice Providers (APPs -  Physician Assistants and Nurse Practitioners) who all work together to provide you with the care you need, when you need it.  We recommend signing up for the patient portal called "MyChart".  Sign up information is provided on this After Visit Summary.  MyChart is used to connect with patients for Virtual Visits (Telemedicine).  Patients are able to view lab/test results, encounter notes, upcoming appointments, etc.  Non-urgent messages can be sent to your provider as well.   To learn more about what you can do with MyChart, go to [email or web address].    Your next appointment:   6 month(s)  The format for your next appointment:   In Person  Provider:   Glenetta Hew, MD    Other Instructions    Monitor heart rate with Jod[facility] Red mobile

## 2022-06-26 LAB — LIPID PANEL
Chol/HDL Ratio: 2.3 ratio (ref 0.0–5.0)
Cholesterol, Total: 123 mg/dL (ref 100–199)
HDL: 53 mg/dL (ref >39)
LDL Chol Calc (NIH): 57 mg/dL (ref 0–99)
Triglycerides: 59 mg/dL (ref 0–149)
VLDL Cholesterol Cal: 13 mg/dL (ref 5–40)

## 2022-06-26 LAB — MAGNESIUM: Magnesium: 2.3 mg/dL (ref 1.6–2.3)

## 2022-06-26 LAB — COMPREHENSIVE METABOLIC PANEL
ALT: 32 IU/L (ref 0–44)
AST: 28 IU/L (ref 0–40)
Albumin/Globulin Ratio: 1.5 (ref 1.2–2.2)
Albumin: 4.7 g/dL (ref 3.7–4.7)
Alkaline Phosphatase: 75 IU/L (ref 44–121)
BUN/Creatinine Ratio: 12 (ref 10–24)
BUN: 13 mg/dL (ref 8–27)
Bilirubin Total: 0.7 mg/dL (ref 0.0–1.2)
CO2: 25 mmol/L (ref 20–29)
Calcium: 9.9 mg/dL (ref 8.6–10.2)
Chloride: 103 mmol/L (ref 96–106)
Creatinine, Ser: 1.08 mg/dL (ref 0.76–1.27)
Globulin, Total: 3.1 g/dL (ref 1.5–4.5)
Glucose: 98 mg/dL (ref 70–99)
Potassium: 5.2 mmol/L (ref 3.5–5.2)
Sodium: 144 mmol/L (ref 134–144)
Total Protein: 7.8 g/dL (ref 6.0–8.5)
eGFR: 69 mL/min/{1.73_m2} (ref >59)

## 2022-06-26 LAB — CBC
Hematocrit: 46.2 % (ref 37.5–51.0)
Hemoglobin: 15.2 g/dL (ref 13.0–17.7)
MCH: 31 pg (ref 26.6–33.0)
MCHC: 32.9 g/dL (ref 31.5–35.7)
MCV: 94 fL (ref 79–97)
Platelets: 203 10*3/uL (ref 150–450)
RBC: 4.9 x10E6/uL (ref 4.14–5.80)
RDW: 11.7 % (ref 11.6–15.4)
WBC: 8.1 10*3/uL (ref 3.4–10.8)

## 2022-06-26 LAB — TSH: TSH: 1.63 u[IU]/mL (ref 0.450–4.500)

## 2022-07-30 DIAGNOSIS — Z23 Encounter for immunization: Secondary | ICD-10-CM | POA: Diagnosis not present

## 2022-10-21 DIAGNOSIS — Z135 Encounter for screening for eye and ear disorders: Secondary | ICD-10-CM | POA: Diagnosis not present

## 2022-10-21 DIAGNOSIS — H5203 Hypermetropia, bilateral: Secondary | ICD-10-CM | POA: Diagnosis not present

## 2022-10-21 DIAGNOSIS — H2513 Age-related nuclear cataract, bilateral: Secondary | ICD-10-CM | POA: Diagnosis not present

## 2022-10-21 DIAGNOSIS — H524 Presbyopia: Secondary | ICD-10-CM | POA: Diagnosis not present

## 2022-10-21 DIAGNOSIS — H52223 Regular astigmatism, bilateral: Secondary | ICD-10-CM | POA: Diagnosis not present

## 2023-01-02 ENCOUNTER — Encounter: Payer: Self-pay | Admitting: Cardiology

## 2023-01-02 NOTE — Progress Notes (Unsigned)
Primary Care Provider: [name], [place] Cardiologist: Glenetta Hew, MD Electrophysiologist: None  Clinic Note: No chief complaint on file.  ===================================  ASSESSMENT/PLAN   Problem List Items Addressed This Visit       Cardiology Problems   Paroxysmal atrial fibrillation [facility]): CV2 Score - 4 (Age-29, HTN, CAD/MI) - On Eliquis (Chronic)   Hyperlipidemia with target low density lipoprotein (LDL) cholesterol less than 70 mg/dL (Chronic)   Frequent PVCs   Essential hypertension (Chronic)   Coronary artery disease involving native coronary artery of native heart with angina pectoris (HCC) - Primary (Chronic)   Cardiomyopathy, ischemic (Chronic)   CAD S/P percutaneous coronary angioplasty (Chronic)     Other   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 male with a CV problem list noted above who presents today for ***.  Anterior STEMI December 2018-LAD PCI.  Known CTO of RCA.  EF initially 45 to 50% improved to 60 to 65% in 2022. Non-STEMI November 2019-PCI to mid-distal LCx. PAF  I last saw Mr. Kenion in May 2022 => reviewed echo results showing EF back to 60-65% I reduced his Coreg to 3.125 mg twice daily, and increased amlodipine to 10 mg daily.  (Concern for bradycardia).  Continued 300 mg irbesartan.  Lipids checked.  Kegan Guseman was last seen on 06/25/2022 by Finis Bud, NP-doing well from a cardiac perspective.  Still active walking 10,000 steps per week.  Also enjoyed doing sudoku.  Normal BPs at home.  Denied chest pain, dyspnea, palpitations, syncope/near syncope or dizziness.  No orthopnea PND or edema.  Claudication. ?  Switch from carvedilol to Toprol recommended reducing caffeine.  Noted to have frequent PVCs, and recommended Kardia-Mobile.  Labs checked.  Recent Hospitalizations: ***  Reviewed   CV studies:    The following studies were reviewed today: (if available, images/films reviewed: From Epic Chart or Care Everywhere) ***:  Interval History:   [name]   CV Review of Symptoms (Summary): {roscv:310661}  REVIEWED OF SYSTEMS   ROS  Mild exercise intolerance.  Just like to get up and go. Flank discomfort that cleared with cough.  I have reviewed and (if needed) personally updated the patient's problem list, medications, allergies, past medical and surgical history, social and family history.   PAST MEDICAL HISTORY   Past Medical History:  Diagnosis Date   Adenomatous polyp of colon 08/2003   Anal fissure    CAD S/P LAD PCI for Antero-Posterior STEMI 09/2017   a) 12/18 STEMI PCI/DESx1 (Xience [place] 4.0 x 38 - 4.6 mm) mLAD, CTO of RCA, normal EF; b) 08/2018  90% m-dCx (DES PCI - Xience Sierra 3.5 x 15).  patent LAD stent, stable known non-dom RCA CTO.   Coronary artery disease    Diverticulosis    High cholesterol    History of myocardial infarction: Anterior - posterior MI (LAD lesion - PCI) 09/14/2017   a) Ant STEMI: STENT SIERRA 4.00 X 38 MM. --Postdilated to 4.6 mm p-m LAD;; b)  NSTEMI 08/2018 - STENT SIERRA 3.5 X 15 MM) m-dCx; => non-STEMI in #2019 with to LCx   Hypertension    Internal hemorrhoids    Paroxysmal A-fib [facility])     PAST SURGICAL HISTORY   Past Surgical History:  Procedure Laterality Date   CARDIOVERSION N/A 09/28/2018   Procedure: CARDIOVERSION;  Surgeon: Larey Dresser, MD;  Location: [facility] ENDOS[facility];  Service: Cardiovascular;  Laterality: N/A;   CORONARY ANGIOPLASTY WITH STENT PLACEMENT     CORONARY STENT INTERVENTION N/A 09/14/2017   Procedure: CORONARY STENT INTERVENTION;  Surgeon: Leonie Man, MD;  Location: [place] CV LAB;  Service: Cardiovascular:: p-mLAD 95% (tandem lesions 80&95%) --> PCI with 2 overlapping Xience [place] DES (distal 4.0 x 38 -> prox 4.0 x 12 --> post-dilated to ~4.6 mm.], 0% residual   CORONARY STENT  INTERVENTION N/A 09/04/2018   Procedure: CORONARY STENT INTERVENTION;  Surgeon: Martinique, Peter M, MD;  Location: [place] CV LAB;  Service: Cardiovascular: m-dCx 90% (DES PCI Xience Sierra 3.5 x 15 -- 3.8 mm)   CORONARY/GRAFT ACUTE MI REVASCULARIZATION N/A 09/14/2017   Procedure: Coronary/Graft Acute MI Revascularization;  Surgeon: Leonie Man, MD;  Location: [place] CV LAB;  Service: Cardiovascular;  Laterality: N/A;   LEFT HEART CATH AND CORONARY ANGIOGRAPHY N/A 09/14/2017   Procedure: LEFT HEART CATH AND CORONARY ANGIOGRAPHY;  Surgeon: Leonie Man, MD;  Location: [place] CV LAB;  p-m LAD 85-95% tandem lesions (PCI), 0% residual.  OstD2 ~50%, m-dCx 60%, LPL1 ~50%. Small Non-dom RCA 100% CTO ~mid.  EF ~45-50% with apical anterior, apical & inferoapical HK.   LEFT HEART CATH AND CORONARY ANGIOGRAPHY N/A 09/04/2018   Procedure: LEFT HEART CATH AND CORONARY ANGIOGRAPHY;  Surgeon: Martinique, Peter M, MD;  Location: [place] CV LAB;  Service: Cardiovascular: m-dCx 90% (DES PCI). Patent p-mLAD (STENT SIERRA 4.00 X 38 MM. --Postdilated to 4.6 mm) stent with stable 50% ost D2  (jailed). known CTO of pRCA with R-R collaterals (non-dominant).     ROTATOR CUFF REPAIR Right    TRANSTHORACIC ECHOCARDIOGRAM  09/2017; 08/2018   a) In setting of anterior STEMI: EF 50 and 55%.  Moderate LVH.  Moderate HK of mid-apical anteroseptal wall.  Mild aortic valve calcification.;; b) EF up to 55-60%.  No R WMA.  Unable to assess diastolic function because of atrial fibrillation.  Mild LA dilation.  Trivial MR.    TRANSTHORACIC ECHOCARDIOGRAM  04/2020   In the setting of syncope related to allergic reaction.  EF 45 to 50%.  Difficult to assess regional wall motion.  Moderately reduced RV function.  Mild to moderate MAC.  Aortic sclerosis but no stenosis.   TRANSTHORACIC ECHOCARDIOGRAM  01/15/2021   EF 60 to 65%.  No R WMA.  Mild LVH. ~Normal diastolic parameters.  Trivial MR.  Mild aortic valve sclerosis but  no stenosis.--EF improved from 45 to 50% up to 60-65% when compared to July 2021.   UMBILICAL HERNIA REPAIR      MEDICATIONS/ALLERGIES   No outpatient medications have been marked as taking for the 01/03/23 encounter (Appointment) with Leonie Man, MD.   Current Facility-Administered Medications for the 01/03/23 encounter (Appointment) with Leonie Man, MD  Medication   zolpidem (AMBIEN) tablet 5 mg    Allergies  Allergen Reactions   Crab [Shellfish Allergy] Anaphylaxis    SOCIAL HISTORY/FAMILY HISTORY   Reviewed in Epic:  Pertinent findings:  Social History   Tobacco Use   Smoking status: Former    Types: Cigarettes    Quit date: 03/02/1985    Years since quitting: 37.8   Smokeless tobacco: Never  Vaping Use   Vaping Use: Unknown  Substance Use Topics   Alcohol use: No    Alcohol/week: 0.0 standard drinks of alcohol   Drug use: No   Social History   Social History Narrative   **  Merged History Encounter **        OBJCTIVE -PE, EKG, labs   Wt Readings from Last 3 Encounters:  06/25/22 208 lb 9.6 oz (94.6 kg)  02/26/21 213 lb 9.6 oz (96.9 kg)  09/11/20 215 lb 12.8 oz (97.9 kg)    Physical Exam: There were no vitals taken for this visit. Physical Exam   Trivial ankle swelling.   Adult ECG Report  Rate: *** ;  Rhythm: {rhythm:17366};   Narrative Interpretation: ***  Recent Labs:  ***  Lab Results  Component Value Date   CHOL 123 06/25/2022   HDL 53 06/25/2022   LDLCALC 57 06/25/2022   TRIG 59 06/25/2022   CHOLHDL 2.3 06/25/2022   Lab Results  Component Value Date   CREATININE 1.08 06/25/2022   BUN 13 06/25/2022   NA 144 06/25/2022   K 5.2 06/25/2022   CL 103 06/25/2022   CO2 25 06/25/2022      Latest Ref Rng & Units 06/25/2022   10:22 AM 04/30/2020   12:52 AM 04/20/2020    7:03 AM  CBC  WBC 3.4 - 10.8 x10E3/uL 8.1  11.2  12.8   Hemoglobin 13.0 - 17.7 g/dL 15.2  13.2  13.1   Hematocrit 37.5 - 51.0 % 46.2  42.2  40.4   Platelets  150 - 450 x10E3/uL 203  398  204     Lab Results  Component Value Date   HGBA1C 5.5 04/17/2020   Lab Results  Component Value Date   TSH 1.630 06/25/2022    ================================================== I spent a total of ***minutes with the patient spent in direct patient consultation.  Additional time spent with chart review  / charting (studies, outside notes, etc): *** min Total Time: *** min  Current medicines are reviewed at length with the patient today.  (+/- concerns) ***  Notice: This dictation was prepared with Dragon dictation along with smart phrase technology. Any transcriptional errors that result from this process are unintentional and may not be corrected upon review.  Studies Ordered:   No orders of the defined types were placed in this encounter.  No orders of the defined types were placed in this encounter.   Patient Instructions / Medication Changes & Studies & Tests Ordered   There are no Patient Instructions on file for this visit.     Leonie Man, MD, MS Glenetta Hew, M.D., M.S. Interventional Cardiologist  [place]  Pager # [phone] Phone # [phone] [address]. [place], [place] [address]   Thank you for choosing [facility]!!

## 2023-01-03 ENCOUNTER — Encounter: Payer: Self-pay | Admitting: Cardiology

## 2023-01-03 ENCOUNTER — Ambulatory Visit: Payer: Medicare Other | Attending: Cardiology | Admitting: Cardiology

## 2023-01-03 VITALS — BP 142/64 | HR 65 | Ht 71.0 in | Wt 214.8 lb

## 2023-01-03 DIAGNOSIS — Z9861 Coronary angioplasty status: Secondary | ICD-10-CM | POA: Insufficient documentation

## 2023-01-03 DIAGNOSIS — E785 Hyperlipidemia, unspecified: Secondary | ICD-10-CM | POA: Diagnosis not present

## 2023-01-03 DIAGNOSIS — I25119 Atherosclerotic heart disease of native coronary artery with unspecified angina pectoris: Secondary | ICD-10-CM | POA: Diagnosis not present

## 2023-01-03 DIAGNOSIS — I480 Paroxysmal atrial fibrillation: Secondary | ICD-10-CM

## 2023-01-03 DIAGNOSIS — I252 Old myocardial infarction: Secondary | ICD-10-CM | POA: Insufficient documentation

## 2023-01-03 DIAGNOSIS — I255 Ischemic cardiomyopathy: Secondary | ICD-10-CM | POA: Diagnosis not present

## 2023-01-03 DIAGNOSIS — R001 Bradycardia, unspecified: Secondary | ICD-10-CM | POA: Insufficient documentation

## 2023-01-03 DIAGNOSIS — I493 Ventricular premature depolarization: Secondary | ICD-10-CM | POA: Diagnosis not present

## 2023-01-03 DIAGNOSIS — I2510 Atherosclerotic heart disease of native coronary artery without angina pectoris: Secondary | ICD-10-CM | POA: Diagnosis not present

## 2023-01-03 DIAGNOSIS — I10 Essential (primary) hypertension: Secondary | ICD-10-CM | POA: Insufficient documentation

## 2023-01-03 MED ORDER — APIXABAN 5 MG PO TABS: 5.0000 mg | ORAL_TABLET | Freq: Two times a day (BID) | ORAL | 0 refills | Status: DC

## 2023-01-03 NOTE — Patient Instructions (Signed)
Medication Instructions:  No changes  *If you need a refill on your cardiac medications before your next appointment, please call your pharmacy*   Lab Work: Not needed     Testing/Procedures:  Not needed  Follow-Up: At [facility], you and your health needs are our priority.  As part of our continuing mission to provide you with exceptional heart care, we have created designated Provider Care Teams.  These Care Teams include your primary Cardiologist (physician) and Advanced Practice Providers (APPs -  Physician Assistants and Nurse Practitioners) who all work together to provide you with the care you need, when you need it.     Your next appointment:   6 month(s)  The format for your next appointment:   In Person  Provider:   Diona Browner, NP    Then, Glenetta Hew, MD will plan to see you again in 12 month(s).   Other Instructions   Just Keep   monitoring your heart rate by using [facility]

## 2023-01-04 ENCOUNTER — Encounter: Payer: Self-pay | Admitting: Cardiology

## 2023-01-04 NOTE — Assessment & Plan Note (Signed)
He has not had breakthrough episodes.  Continues to monitor on his [place].  He does not Kardia-Mobile recording every day or if he notes symptoms.  Has not noticed any A-fib.  Plan: Converted from carvedilol to Toprol-May anticipate increasing dose. On DOAC-Eliquis with no bleeding issues.

## 2023-01-04 NOTE — Assessment & Plan Note (Signed)
History of PCI to the LAD in 2018 and then to the circumflex in 2019.  No longer on Thienopyridine agent either aspirin or Plavix because he is on apixaban for A-fib.

## 2023-01-04 NOTE — Assessment & Plan Note (Signed)
Labs as of September look great.  LDL 57 on combination of rosuvastatin and Zetia.  No change.

## 2023-01-04 NOTE — Assessment & Plan Note (Addendum)
Doing well with no further angina.  He had PCI to the circumflex.  EF now back to normal with no RWMA.  Plan: Not on aspirin or Plavix because of Eliquis. Carvedilol converted to Toprol because of PVC burden.  He seems to be tolerating it well blood pressure stable in conjunction with irbesartan Continue with Zetia and rosuvastatin 40 mg. Continue 10 mg amlodipine.  Anginal benefit along with the Toprol.   PRN nitroglycerin.

## 2023-01-04 NOTE — Assessment & Plan Note (Signed)
Difficult because he has lots of PVCs but also borderline bradycardia.  He had a cut back his carvedilol dose before.  Monitor for now, but if we are to push the beta-blocker further, may need to consider assessment for pacemaker.Marland Kitchen

## 2023-01-04 NOTE — Assessment & Plan Note (Signed)
BP on recheck today was 126/62.  May still have room to further titrate Toprol but currently doing okay on Toprol 25 mg daily along with irbesartan 3 mg daily.  Also on amlodipine 10 mg.

## 2023-01-04 NOTE — Assessment & Plan Note (Signed)
He is actually had an anterior and posterior MI with LAD PCI followed by a non-STEMI requiring circumflex PCI.  Thankfully, his EF is back to baseline and is on stable regimen.

## 2023-01-04 NOTE — Assessment & Plan Note (Signed)
Pretty significant PVC burden seen here today.  Echo 2 years ago look great.  He is not really sensing these.  I would simply continue Toprol at 25 mg, but low threshold to titrate up higher if tolerable.

## 2023-01-04 NOTE — Assessment & Plan Note (Addendum)
Resolved based on recent echo.  EF now 60 to 65% with no RWMA.  Continue beta-blocker and ARB.  No diuretic requirement.

## 2023-06-20 ENCOUNTER — Other Ambulatory Visit: Payer: Self-pay

## 2023-06-20 MED ORDER — EZETIMIBE 10 MG PO TABS: 10.0000 mg | ORAL_TABLET | Freq: Every day | ORAL | 1 refills | Status: DC

## 2023-06-23 DIAGNOSIS — Z23 Encounter for immunization: Secondary | ICD-10-CM | POA: Diagnosis not present

## 2023-07-22 ENCOUNTER — Other Ambulatory Visit: Payer: Self-pay

## 2023-07-22 MED ORDER — APIXABAN 5 MG PO TABS: 5.0000 mg | ORAL_TABLET | Freq: Two times a day (BID) | ORAL | 1 refills | Status: DC

## 2023-07-22 NOTE — Telephone Encounter (Signed)
Prescription refill request for Eliquis received. Indication:afib Last office visit:3/24 Scr:1.0  9/23 Age: 82 Weight:97.4  kg  Prescription refilled

## 2023-09-29 ENCOUNTER — Other Ambulatory Visit: Payer: Self-pay | Admitting: Nurse Practitioner

## 2023-09-29 MED ORDER — ROSUVASTATIN CALCIUM 40 MG PO TABS: 40.0000 mg | ORAL_TABLET | Freq: Every day | ORAL | 0 refills | Status: DC

## 2023-09-29 MED ORDER — METOPROLOL SUCCINATE ER 25 MG PO TB24: 25.0000 mg | ORAL_TABLET | Freq: Every day | ORAL | 0 refills | Status: DC

## 2023-09-29 MED ORDER — IRBESARTAN 300 MG PO TABS: 300.0000 mg | ORAL_TABLET | Freq: Every day | ORAL | 0 refills | Status: DC

## 2023-10-22 ENCOUNTER — Other Ambulatory Visit: Payer: Self-pay

## 2023-10-22 MED ORDER — AMLODIPINE BESYLATE 10 MG PO TABS: 10.0000 mg | ORAL_TABLET | Freq: Every day | ORAL | 0 refills | Status: DC

## 2023-10-23 DIAGNOSIS — H52223 Regular astigmatism, bilateral: Secondary | ICD-10-CM | POA: Diagnosis not present

## 2023-10-23 DIAGNOSIS — H40029 Open angle with borderline findings, high risk, unspecified eye: Secondary | ICD-10-CM | POA: Diagnosis not present

## 2023-10-23 DIAGNOSIS — H524 Presbyopia: Secondary | ICD-10-CM | POA: Diagnosis not present

## 2023-10-23 DIAGNOSIS — H2513 Age-related nuclear cataract, bilateral: Secondary | ICD-10-CM | POA: Diagnosis not present

## 2023-10-23 DIAGNOSIS — Z135 Encounter for screening for eye and ear disorders: Secondary | ICD-10-CM | POA: Diagnosis not present

## 2023-10-23 DIAGNOSIS — H5203 Hypermetropia, bilateral: Secondary | ICD-10-CM | POA: Diagnosis not present

## 2023-11-28 ENCOUNTER — Other Ambulatory Visit: Payer: Self-pay

## 2023-11-28 DIAGNOSIS — I480 Paroxysmal atrial fibrillation: Secondary | ICD-10-CM

## 2023-11-28 MED ORDER — EZETIMIBE 10 MG PO TABS: 10.0000 mg | ORAL_TABLET | Freq: Every day | ORAL | 0 refills | Status: DC

## 2023-11-28 MED ORDER — APIXABAN 5 MG PO TABS: 5.0000 mg | ORAL_TABLET | Freq: Two times a day (BID) | ORAL | 0 refills | Status: DC

## 2023-11-28 NOTE — Addendum Note (Signed)
Addended by: Margaret Pyle D on: 11/28/2023 11:25 AM   Modules accepted: Orders

## 2023-11-28 NOTE — Telephone Encounter (Signed)
Prescription refill request for Eliquis received. Indication:afib Last office visit:3/24 ZOX:WRUEA labs Age: 83 Weight:97.4  kg  Prescription refilled

## 2023-12-26 ENCOUNTER — Other Ambulatory Visit: Payer: Self-pay | Admitting: Cardiology

## 2023-12-26 NOTE — Telephone Encounter (Addendum)
 Eliquis 5mg  refill request received. Patient is 83 years old, weight-97.4kg, Crea-1.08 on 06/25/22-needs labs, Diagnosis-Afib, and last seen by Dr. Herbie Baltimore on 01/03/23-needs appt and was due back in 6 months. Dose is appropriate based on dosing criteria.   Last Refill sent 11/28/23 for 1 month supply due to pt needing labs. Called PCP and they stated he has not been to see them since 2023.

## 2023-12-26 NOTE — Telephone Encounter (Signed)
 Schedulers called pt and left a message for the pt to set an appointment with Cardiologist.

## 2023-12-29 NOTE — Telephone Encounter (Signed)
 Pt has not returned the call; will deny refill at this time

## 2024-01-02 ENCOUNTER — Other Ambulatory Visit: Payer: Self-pay

## 2024-01-02 ENCOUNTER — Encounter: Payer: Self-pay | Admitting: Cardiology

## 2024-01-02 ENCOUNTER — Telehealth: Payer: Self-pay | Admitting: Cardiology

## 2024-01-02 MED ORDER — METOPROLOL SUCCINATE ER 25 MG PO TB24: 25.0000 mg | ORAL_TABLET | Freq: Every day | ORAL | 0 refills | Status: DC

## 2024-01-02 NOTE — Telephone Encounter (Signed)
-----   Message from Nurse Candance H sent at 12/26/2023 11:41 AM EDT ----- Regarding: Needs Appt Good afternoon,   This patient is overdue to see his Cardiologist. He was last seen by Dr. Herbie Baltimore on 01/03/23 and per recall was due back in 6 months with an APP. Can you please reach out to him when you have a chance to get an appt scheduled?   Thanks in advance, [address]

## 2024-01-02 NOTE — Telephone Encounter (Signed)
 Patient contacted 3x with no success, will send letter to have pt call office

## 2024-01-05 ENCOUNTER — Other Ambulatory Visit: Payer: Self-pay

## 2024-01-05 MED ORDER — IRBESARTAN 300 MG PO TABS
300.0000 mg | ORAL_TABLET | Freq: Every day | ORAL | 0 refills | Status: DC
Start: 2024-01-05 — End: 2024-02-03

## 2024-02-03 ENCOUNTER — Other Ambulatory Visit: Payer: Self-pay

## 2024-02-03 MED ORDER — AMLODIPINE BESYLATE 10 MG PO TABS: 10.0000 mg | ORAL_TABLET | Freq: Every day | ORAL | 1 refills | Status: DC

## 2024-02-03 MED ORDER — EZETIMIBE 10 MG PO TABS
10.0000 mg | ORAL_TABLET | Freq: Every day | ORAL | 1 refills | Status: DC
Start: 2024-02-03 — End: 2024-03-01

## 2024-02-03 MED ORDER — ROSUVASTATIN CALCIUM 40 MG PO TABS
40.0000 mg | ORAL_TABLET | Freq: Every day | ORAL | 1 refills | Status: DC
Start: 2024-02-03 — End: 2024-03-01

## 2024-02-03 MED ORDER — IRBESARTAN 300 MG PO TABS: 300.0000 mg | ORAL_TABLET | Freq: Every day | ORAL | 1 refills | Status: DC

## 2024-02-03 MED ORDER — METOPROLOL SUCCINATE ER 25 MG PO TB24: 25.0000 mg | ORAL_TABLET | Freq: Every day | ORAL | 1 refills | Status: DC

## 2024-03-01 ENCOUNTER — Other Ambulatory Visit: Payer: Self-pay | Admitting: Nurse Practitioner

## 2024-03-01 ENCOUNTER — Ambulatory Visit: Attending: Cardiology | Admitting: Cardiology

## 2024-03-01 ENCOUNTER — Encounter: Payer: Self-pay | Admitting: Cardiology

## 2024-03-01 VITALS — BP 144/66 | HR 68 | Ht 71.0 in | Wt 221.8 lb

## 2024-03-01 DIAGNOSIS — Z9861 Coronary angioplasty status: Secondary | ICD-10-CM | POA: Diagnosis not present

## 2024-03-01 DIAGNOSIS — I10 Essential (primary) hypertension: Secondary | ICD-10-CM | POA: Diagnosis not present

## 2024-03-01 DIAGNOSIS — I480 Paroxysmal atrial fibrillation: Secondary | ICD-10-CM

## 2024-03-01 DIAGNOSIS — I25119 Atherosclerotic heart disease of native coronary artery with unspecified angina pectoris: Secondary | ICD-10-CM | POA: Diagnosis not present

## 2024-03-01 DIAGNOSIS — I2510 Atherosclerotic heart disease of native coronary artery without angina pectoris: Secondary | ICD-10-CM

## 2024-03-01 DIAGNOSIS — I493 Ventricular premature depolarization: Secondary | ICD-10-CM

## 2024-03-01 DIAGNOSIS — R001 Bradycardia, unspecified: Secondary | ICD-10-CM

## 2024-03-01 DIAGNOSIS — I252 Old myocardial infarction: Secondary | ICD-10-CM

## 2024-03-01 DIAGNOSIS — E785 Hyperlipidemia, unspecified: Secondary | ICD-10-CM | POA: Diagnosis not present

## 2024-03-01 MED ORDER — APIXABAN 5 MG PO TABS: 5.0000 mg | ORAL_TABLET | Freq: Two times a day (BID) | ORAL | 2 refills | Status: DC

## 2024-03-01 MED ORDER — IRBESARTAN-HYDROCHLOROTHIAZIDE 300-12.5 MG PO TABS: 1.0000 | ORAL_TABLET | Freq: Every day | ORAL | 3 refills | Status: AC

## 2024-03-01 MED ORDER — NITROGLYCERIN 0.4 MG SL SUBL: 0.4000 mg | SUBLINGUAL_TABLET | SUBLINGUAL | 6 refills | Status: AC | PRN

## 2024-03-01 MED ORDER — METOPROLOL SUCCINATE ER 25 MG PO TB24: 25.0000 mg | ORAL_TABLET | Freq: Every day | ORAL | 3 refills | Status: AC

## 2024-03-01 MED ORDER — EZETIMIBE 10 MG PO TABS: 10.0000 mg | ORAL_TABLET | Freq: Every day | ORAL | 3 refills | Status: AC

## 2024-03-01 MED ORDER — ROSUVASTATIN CALCIUM 40 MG PO TABS: 40.0000 mg | ORAL_TABLET | Freq: Every day | ORAL | 3 refills | Status: DC

## 2024-03-01 NOTE — Progress Notes (Signed)
 Cardiology Office Note:  .   Date:  03/05/2024  ID:  Travis Key, DOB 1941/10/01, [name] [id number] PCP/Referring Provider: Rella Cardinal, FNP  [facility] Providers Cardiologist:  Randene Bustard, MD     Chief Complaint  Patient presents with   Follow-up    Annual follow-up.  Doing well.  No major issues.  Does not notice palpitations.   Coronary Artery Disease    No angina   Congestive Heart Failure    Active with no dyspnea.  No edema.    Patient Profile: .     Travis Key is a otherwise healthy 83 y.o. male with a PMH notable for CAD (Anterior STEMI-LAD PCI and occluded RCA, followed by non-STEMI with LCx PCI), HFmrEF (NYHA class I-II), PAF and frequent PVCs (asymptomatic) who presents here for delayed annual follow-up.  Anterior STEMI December 2018-LAD PCI.  Known CTO of RCA.   EF initially 45 to 50% improved to 60 to 65% in 2022.  (See below) Non-STEMI November 2019-PCI to mid-distal LCx. PAF-  Frequent PVCs     [name] was last seen on 01/03/2023" -> he was doing fairly well.  Still walking about 4000 steps a day almost every day the week sometimes up to 10,000 steps.  No chest pain or pressure.  Rare off-and-on lightheaded sensations and fluttering in his chest but not exertional.  Rare edema.  He has a Kardia mobile device and has not noticed any sustained arrhythmias.  Ectopy seems to improved since switching to decaf.  Subjective  Discussed the use of AI scribe software for clinical note transcription with the patient, who gave verbal consent to proceed. History of Present Illness Travis Key is an 83 year old male with atrial fibrillation and coronary artery disease who presents for a follow-up visit. He is doing quite well.  He is currently asymptomatic during activities such as walking and gardening.  With his productivity, he denies chest pain pressure tightness with rest or exertion.  No heart failure symptoms of PND or orthopnea. No episodes of palpitations,  including heart racing, skipping, or flipping. He does not perceive premature beats and monitors his heart rate with a watch, which shows 53 beats per minute.  His home blood pressure typically runs in the 130s, but it was noted to be higher during the visit. No headaches, blurred vision, dizziness, orthopnea, or chest pain. He experiences some edema in his lower extremities, which resolves by morning and improves with elevation. No issues with blood in stools, dark stools, or epistaxis.   His medication regimen includes irbesartan  300 mg, metoprolol  succinate 25 mg, amlodipine  10 mg, and Eliquis  for atrial fibrillation. He recently transferred his Eliquis  prescription from [facility]. He is also on rosuvastatin  40 mg and Zetia  10 mg for cholesterol management.  No PND, orthopnea to go along with the trivial edema.  Otherwise has a palpitations, no rapid irregular heartbeats.  No syncope or near CP.  No TIA or amaurosis fugax.  No claudication.    Objective   Medications - Irbesartan  300 mg daily; - Metoprolol  succinate 25 mg daily;- Amlodipine  10 mg daily - Eliquis  5 mg twice daily ([facility]) - Zetia  10 mg daily;- Rosuvastatin  40 mg daily   Studies Reviewed: Aaron Aas   EKG Interpretation Date/Time:  Monday Mar 01 2024 16:30:30 EDT Ventricular Rate:  68 PR Interval:    QRS Duration:  92 QT Interval:  442 QTC Calculation: 469 R Axis:   9  Text  Interpretation: Sinus rhythm with frequent Premature ventricular complexes When compared with ECG of 28-Sep-2018 07:56, More frequent Premature ventricular complexes Confirmed by Randene Bustard ([id number]) on 03/01/2024 5:08:34 PM    frequent premature ventricular contractions (bigeminy and trigeminy).  Last recorded labs from (06/25/2022-([facility]: TC 120, TG 59, HDL 53, LDL 57. Cr 1.08, K+ 5.2, Hgb 15.2, TSH 1.63.  Previous Studies Cardiac Catheterization: Two overlapping stents placed in the LAD: Xience Sierra 4.0 x 38 mm and 4.0 x 12 mm.  Circumflex artery 60% blockage.  Codominant RCA CTO with L-R collaterals (09/2017) Cardiac Catheterization: Stent placed in circumflex artery: Xience Sierra 3.5 x 15 mm. Codominant RCA CTO with L-R collaterals. (09/04/2018) Echocardiogram:  Normal LV function.  EF 60 to 65%.  No RWMA.  Mild LVH with normal diastolic pressures.  Mild AV sclerosis but no stenosis.  Normal mitral valve.  Normal RV and RV P.(01/2021)  Risk Assessment/Calculations:    CHA2DS2-VASc Score = 5   This indicates a 7.2% annual risk of stroke. The patient's score is based upon: CHF History: 1 HTN History: 1 Diabetes History: 0 Stroke History: 0 Vascular Disease History: 1 Age Score: 2 Gender Score: 0    HYPERTENSION CONTROL Vitals:   03/01/24 1627 03/01/24 1717  BP: (!) 149/67 (!) 144/66    The patient's blood pressure is elevated above target today. Adding HCTZ 12.5 mg to the ARB.  Making the combo med.    Physical Exam:   VS:  BP (!) 144/66   Pulse 68   Ht 5\' 11"  (1.803 m)   Wt 221 lb 12.8 oz (100.6 kg)   SpO2 93%   BMI 30.93 kg/m    Wt Readings from Last 3 Encounters:  03/01/24 221 lb 12.8 oz (100.6 kg)  01/03/23 214 lb 12.8 oz (97.4 kg)  06/25/22 208 lb 9.6 oz (94.6 kg)    GEN: Healthy appearing well nourished, well groomed in no acute distress; mildly obese but NECK: No JVD; No carotid bruits CARDIAC: RRR with frequent ectopy;  Normal S1, S2;1/6 SEM. No other murmurs, rubs, gallops RESPIRATORY:  Clear to auscultation without rales, wheezing or rhonchi ; nonlabored, good air movement. ABDOMEN: Soft, non-tender, non-distended EXTREMITIES:  No edema; No deformity      ASSESSMENT AND PLAN: .    Problem List Items Addressed This Visit       Cardiology Problems   CAD S/P percutaneous coronary angioplasty - Primary (Chronic)   Anterior STEMI in 2018 with overlapping stents placed in the LAD with recurrent non-STEMI in November 2019 with progression of disease in the LCx-PCI LCx.  Known RCA  occlusion. Remains very physically active with no current angina or heart failure symptoms. - Continue rosuvastatin  40 mg daily and Zetia  10 mg daily. >  Check labs - Continue metoprolol  succinate 25 mg daily along with amlodipine  10 mg daily for BP/antianginal benefit - Discontinue irbesartan  300 mg -> convert to irbesartan -HCTZ 300-12.5 mg daily - Monitor for new angina or heart failure symptoms. => As needed NTG refilled.      Relevant Medications   irbesartan -hydrochlorothiazide  (AVALIDE) 300-12.5 MG tablet   ezetimibe  (ZETIA ) 10 MG tablet   metoprolol  succinate (TOPROL  XL) 25 MG 24 hr tablet   rosuvastatin  (CRESTOR ) 40 MG tablet   nitroGLYCERIN  (NITROSTAT ) 0.4 MG SL tablet   apixaban  (ELIQUIS ) 5 MG TABS tablet   Coronary artery disease, occluded RCA with bridging collaterals and left-to-right collaterals (Chronic)   Patient has three-vessel disease with PCI to the LAD and LCx  and occluded RCA with left-to-right collaterals.  Asymptomatic. -Not on aspirin  because of Eliquis . -On combination of amlodipine  10 mg daily and Toprol  25 Miller daily for antianginal benefit along with irbesartan  300 mg daily for afterload reduction which we will add HCTZ 12.5 mg for additional BP control. -On 40 mg rosuvastatin  +10 mg Zetia -due for labs to recheck blood work-however labs were controlled fairly well back in 2023.      Relevant Medications   irbesartan -hydrochlorothiazide  (AVALIDE) 300-12.5 MG tablet   ezetimibe  (ZETIA ) 10 MG tablet   metoprolol  succinate (TOPROL  XL) 25 MG 24 hr tablet   rosuvastatin  (CRESTOR ) 40 MG tablet   nitroGLYCERIN  (NITROSTAT ) 0.4 MG SL tablet   apixaban  (ELIQUIS ) 5 MG TABS tablet   Essential hypertension (Chronic)   Blood pressure slightly elevated.  Peripheral edema likely due to amlodipine .  Plan to adjust regimen to control blood pressure and address edema. - Add HCTZ 12.5 mg to irbesartan  regimen. - Prescribe irbesartan -HCTZ combination (300-12.5 mg) for next  refill. - Instruct to take irbesartan  -HCTZ in the morning and amlodipine  in the evening. - Monitor blood pressure at home. - Check kidney function and potassium levels 1-2 weeks after starting HCTZ.      Relevant Medications   irbesartan -hydrochlorothiazide  (AVALIDE) 300-12.5 MG tablet   ezetimibe  (ZETIA ) 10 MG tablet   metoprolol  succinate (TOPROL  XL) 25 MG 24 hr tablet   rosuvastatin  (CRESTOR ) 40 MG tablet   nitroGLYCERIN  (NITROSTAT ) 0.4 MG SL tablet   apixaban  (ELIQUIS ) 5 MG TABS tablet   Other Relevant Orders   EKG 12-Lead (Completed)   Basic metabolic panel with GFR   CBC   Lipid panel   Frequent PVCs (Chronic)   Completely asymptomatic.  On max tolerated dose of beta-blocker due to low resting heart rate. 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  Likely related to RCA territory infarction. With preserved EF, not a candidate for ICD.  Asymptomatic so therefore would not refer for ablation.      Relevant Medications   irbesartan -hydrochlorothiazide  (AVALIDE) 300-12.5 MG tablet   ezetimibe  (ZETIA ) 10 MG tablet   metoprolol  succinate (TOPROL  XL) 25 MG 24 hr tablet   rosuvastatin  (CRESTOR ) 40 MG tablet   nitroGLYCERIN  (NITROSTAT ) 0.4 MG SL tablet   apixaban  (ELIQUIS ) 5 MG TABS tablet   Hyperlipidemia with target low density lipoprotein (LDL) cholesterol less than 70 mg/dL (Chronic)   Managed with rosuvastatin  and Zetia . . Due for cholesterol level check. - Order lipid panel 1-2 weeks after starting new antihypertensive regimen. - Continue rosuvastatin  40 mg daily and Zetia  10 mg daily.      Relevant Medications   irbesartan -hydrochlorothiazide  (AVALIDE) 300-12.5 MG tablet   ezetimibe  (ZETIA ) 10 MG tablet   metoprolol  succinate (TOPROL  XL) 25 MG 24 hr tablet   rosuvastatin  (CRESTOR ) 40 MG tablet   nitroGLYCERIN  (NITROSTAT ) 0.4 MG SL tablet   apixaban  (ELIQUIS ) 5 MG TABS tablet   Other Relevant Orders   Basic metabolic panel with GFR   CBC   Lipid panel    Paroxysmal atrial fibrillation (HCC): CV2 Score - 4 (Age-89, HTN, CAD/MI) - On Eliquis  (Chronic)   Managed with Eliquis  and metoprolol  succinate. No recent palpitations or arrhythmia awareness. - Continue Eliquis  5 mg twice daily for anticoagulation. - Continue metoprolol  succinate 25 mg daily for rate control.  In the absence of any breakthrough episodes, no need for change.      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  Has known RCA occlusion with stents to the LAD and LCx and left-to-right collaterals to the RCA. Number current angina or heart failure symptoms.      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     Signed, Arleen Lacer, MD, MS Randene Bustard, M.D., M.S. Interventional Chartered certified accountant  Pager # [phone]

## 2024-03-01 NOTE — Telephone Encounter (Signed)
 Prescription refill request for Eliquis  received. Indication: PAF Last office visit: Has appt for 03/01/24 with Dr Addie Holstein Scr: 1.08 on 06/25/22  (Past due) Age: 83 Weight: 97.4kg  Based on above findings Eliquis  5mg  twice daily is the appropriate dose.  Pt has appt to see Dr Addie Holstein today and needs labs also. Refill approved x 1 only.

## 2024-03-01 NOTE — Patient Instructions (Addendum)
.  Medication Instructions:    All Medication refilled   Stop Irbesartan   Start Irbesartan -hydrochlorothiazide  300 /12.5 mg daily     *If you need a refill on your cardiac medications before your next appointment, please call your pharmacy*   Lab Work:  one week after starting Irbesartan  - hydrochlorothiazide  300/12.5 mg LIPiD CMP CBC If you have labs (blood work) drawn today and your tests are completely normal, you will receive your results only by: MyChart Message (if you have MyChart) OR A paper copy in the mail If you have any lab test that is abnormal or we need to change your treatment, we will call you to review the results.   Testing/Procedures: Not needed   Follow-Up: At [facility], you and your health needs are our priority.  As part of our continuing mission to provide you with exceptional heart care, we have created designated Provider Care Teams.  These Care Teams include your primary Cardiologist (physician) and Advanced Practice Providers (APPs -  Physician Assistants and Nurse Practitioners) who all work together to provide you with the care you need, when you need it.     Your next appointment:   3 month(s)  The format for your next appointment:   In Person  Provider:   Lawana Pray, NP, Marcie Sever, PA-C, Dayna Dunn, PA-C, Palmer Bobo, NP, Callie Goodrich, PA-C, Kathleen Johnson, PA-C, Hao Meng, PA-C, or Marlana Silvan, NP  Then, Randene Bustard, MD will plan to see you again in 12 month(s).   Other Instructions

## 2024-03-05 ENCOUNTER — Encounter: Payer: Self-pay | Admitting: Cardiology

## 2024-03-05 NOTE — Assessment & Plan Note (Signed)
 Managed with rosuvastatin  and Zetia . . Due for cholesterol level check. - Order lipid panel 1-2 weeks after starting new antihypertensive regimen. - Continue rosuvastatin  40 mg daily and Zetia  10 mg daily.

## 2024-03-05 NOTE — Assessment & Plan Note (Signed)
 Managed with Eliquis  and metoprolol  succinate. No recent palpitations or arrhythmia awareness. - Continue Eliquis  5 mg twice daily for anticoagulation. - Continue metoprolol  succinate 25 mg daily for rate control.  In the absence of any breakthrough episodes, no need for change.

## 2024-03-05 NOTE — Assessment & Plan Note (Signed)
 Patient has three-vessel disease with PCI to the LAD and LCx and occluded RCA with left-to-right collaterals.  Asymptomatic. -Not on aspirin  because of Eliquis . -On combination of amlodipine  10 mg daily and Toprol  25 Miller daily for antianginal benefit along with irbesartan  300 mg daily for afterload reduction which we will add HCTZ 12.5 mg for additional BP control. -On 40 mg rosuvastatin  +10 mg Zetia -due for labs to recheck blood work-however labs were controlled fairly well back in 2023.

## 2024-03-05 NOTE — Assessment & Plan Note (Signed)
 Anterior STEMI in 2018 with overlapping stents placed in the LAD with recurrent non-STEMI in November 2019 with progression of disease in the LCx-PCI LCx.  Known RCA occlusion. Remains very physically active with no current angina or heart failure symptoms. - Continue rosuvastatin  40 mg daily and Zetia  10 mg daily. >  Check labs - Continue metoprolol  succinate 25 mg daily along with amlodipine  10 mg daily for BP/antianginal benefit - Discontinue irbesartan  300 mg -> convert to irbesartan -HCTZ 300-12.5 mg daily - Monitor for new angina or heart failure symptoms. => As needed NTG refilled.

## 2024-03-05 NOTE — Assessment & Plan Note (Addendum)
 Completely asymptomatic.  On max tolerated dose of beta-blocker due to low resting heart rate. 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  Likely related to RCA territory infarction. With preserved EF, not a candidate for ICD.  Asymptomatic so therefore would not refer for ablation.

## 2024-03-05 NOTE — Assessment & Plan Note (Signed)
 Unable to titrate up beta-blocker despite significant PVCs

## 2024-03-05 NOTE — Assessment & Plan Note (Signed)
 Blood pressure slightly elevated.  Peripheral edema likely due to amlodipine .  Plan to adjust regimen to control blood pressure and address edema. - Add HCTZ 12.5 mg to irbesartan  regimen. - Prescribe irbesartan -HCTZ combination (300-12.5 mg) for next refill. - Instruct to take irbesartan  -HCTZ in the morning and amlodipine  in the evening. - Monitor blood pressure at home. - Check kidney function and potassium levels 1-2 weeks after starting HCTZ.

## 2024-03-05 NOTE — Assessment & Plan Note (Signed)
 History of anterior STEMI event inferolateral non-STEMI in back-to-back years-2018 2019.  Has known RCA occlusion with stents to the LAD and LCx and left-to-right collaterals to the RCA. Number current angina or heart failure symptoms.

## 2024-03-30 DIAGNOSIS — Z23 Encounter for immunization: Secondary | ICD-10-CM | POA: Diagnosis not present

## 2024-03-30 DIAGNOSIS — E785 Hyperlipidemia, unspecified: Secondary | ICD-10-CM | POA: Diagnosis not present

## 2024-03-30 DIAGNOSIS — I10 Essential (primary) hypertension: Secondary | ICD-10-CM | POA: Diagnosis not present

## 2024-03-30 DIAGNOSIS — I25119 Atherosclerotic heart disease of native coronary artery with unspecified angina pectoris: Secondary | ICD-10-CM | POA: Diagnosis not present

## 2024-03-31 LAB — CBC
Hematocrit: 46.5 % (ref 37.5–51.0)
Hemoglobin: 15.3 g/dL (ref 13.0–17.7)
MCH: 31.9 pg (ref 26.6–33.0)
MCHC: 32.9 g/dL (ref 31.5–35.7)
MCV: 97 fL (ref 79–97)
Platelets: 213 10*3/uL (ref 150–450)
RBC: 4.79 x10E6/uL (ref 4.14–5.80)
RDW: 11.8 % (ref 11.6–15.4)
WBC: 9.2 10*3/uL (ref 3.4–10.8)

## 2024-03-31 LAB — LIPID PANEL
Chol/HDL Ratio: 2.7 ratio (ref 0.0–5.0)
Cholesterol, Total: 113 mg/dL (ref 100–199)
HDL: 42 mg/dL (ref >39)
LDL Chol Calc (NIH): 57 mg/dL (ref 0–99)
Triglycerides: 63 mg/dL (ref 0–149)
VLDL Cholesterol Cal: 14 mg/dL (ref 5–40)

## 2024-03-31 LAB — BASIC METABOLIC PANEL WITH GFR
BUN/Creatinine Ratio: 12 (ref 10–24)
BUN: 16 mg/dL (ref 8–27)
CO2: 20 mmol/L (ref 20–29)
Calcium: 9.3 mg/dL (ref 8.6–10.2)
Chloride: 99 mmol/L (ref 96–106)
Creatinine, Ser: 1.3 mg/dL — ABNORMAL HIGH (ref 0.76–1.27)
Glucose: 95 mg/dL (ref 70–99)
Potassium: 4.4 mmol/L (ref 3.5–5.2)
Sodium: 137 mmol/L (ref 134–144)
eGFR: 55 mL/min/{1.73_m2} — ABNORMAL LOW (ref >59)

## 2024-04-01 ENCOUNTER — Ambulatory Visit: Payer: Self-pay | Admitting: Cardiology

## 2024-04-02 DIAGNOSIS — H40013 Open angle with borderline findings, low risk, bilateral: Secondary | ICD-10-CM | POA: Diagnosis not present

## 2024-04-02 DIAGNOSIS — H2513 Age-related nuclear cataract, bilateral: Secondary | ICD-10-CM | POA: Diagnosis not present

## 2024-06-29 ENCOUNTER — Other Ambulatory Visit: Payer: Self-pay | Admitting: Cardiology

## 2024-06-29 NOTE — Telephone Encounter (Signed)
 Prescription refill request for Eliquis  received. Indication:afib Last office visit:5/25 Scr:1.30  6/25 Age: 83 Weight:100.6  kg  Prescription refilled

## 2024-08-24 ENCOUNTER — Other Ambulatory Visit: Payer: Self-pay | Admitting: Cardiology

## 2024-08-30 ENCOUNTER — Other Ambulatory Visit: Payer: Self-pay | Admitting: Cardiology

## 2024-08-31 ENCOUNTER — Telehealth: Payer: Self-pay | Admitting: Cardiology

## 2024-08-31 MED ORDER — AMLODIPINE BESYLATE 10 MG PO TABS: 10.0000 mg | ORAL_TABLET | Freq: Every day | ORAL | 1 refills | Status: DC

## 2024-08-31 NOTE — Telephone Encounter (Signed)
 Pt c/o medication issue:  1. Name of Medication:    2. How are you currently taking this medication (dosage and times per day)? Unknown   3. Are you having a reaction (difficulty breathing--STAT)? No   4. What is your medication issue? Pt would like a c/b to clarification medication please advise

## 2024-08-31 NOTE — Telephone Encounter (Signed)
 Pt's medications are already at his preferred pharmacy. Confirmation received

## 2024-08-31 NOTE — Telephone Encounter (Signed)
 Spoke with Travis Key. Do not see where this medication was discontinued and will send to pharmacy. (This RN can see med request fax in Epic under media) Travis Key did not have 3 month follow up and appointment set with Travis Key 10/19/24 as first available. Patient stated understanding.

## 2024-08-31 NOTE — Telephone Encounter (Signed)
*  STAT* If patient is at the pharmacy, call can be transferred to refill team.   1. Which medications need to be refilled? (please list name of each medication and dose if known)   apixaban  (ELIQUIS ) 5 MG TABS tablet    ezetimibe  (ZETIA ) 10 MG tablet    metoprolol  succinate (TOPROL  XL) 25 MG 24 hr tablet    rosuvastatin  (CRESTOR ) 40 MG tablet    irbesartan -hydrochlorothiazide  (AVALIDE) 300-12.5 MG tablet    2. Would you like to learn more about the convenience, safety, & potential cost savings by using the [facility]? No    3. Are you open to using the [facility] (Type [facility].  ). No    4. Which pharmacy/location (including street and city if local pharmacy) is medication to be sent to? [facility] - [place], [place] - [address]     5. Do they need a 30 day or 90 day supply? 90 day   Pt is low on medication

## 2024-08-31 NOTE — Telephone Encounter (Signed)
 Returned call to pt.  Just needed to clarify if pt switched pharmacies.  02/2024 Dr. Anner sent in [facility].

## 2024-10-17 NOTE — Progress Notes (Unsigned)
 "   Cardiology Clinic Note   Patient Name: Travis Key Date of Encounter: 10/19/2024  Primary Care Provider:  Wilkie Majel RAMAN, FNP Primary Cardiologist:  Alm Clay, MD  Patient Profile    Travis Key 84 year old male presents to the clinic today for follow-up evaluation of his hypertension and coronary artery disease.  Past Medical History    Past Medical History:  Diagnosis Date   Adenomatous polyp of colon 08/2003   Anal fissure    CAD S/P LAD PCI for Antero-Posterior STEMI 09/2017   a) 12/18 STEMI PCI/DESx1 (Xience Sierra 4.0 x 38 - 4.6 mm) mLAD, CTO of RCA, normal EF; b) 08/2018  90% m-dCx (DES PCI - Xience Sierra 3.5 x 15).  patent LAD stent, stable known non-dom RCA CTO.   Coronary artery disease    Diverticulosis    High cholesterol    History of myocardial infarction: Anterior - posterior MI (LAD lesion - PCI) 09/14/2017   a) Ant STEMI: STENT SIERRA 4.00 X 38 MM. --Postdilated to 4.6 mm p-m LAD;; b)  NSTEMI 08/2018 - STENT SIERRA 3.5 X 15 MM) m-dCx; => non-STEMI in #2019 with to LCx   Hypertension    Internal hemorrhoids    Paroxysmal A-fib [facility])    Past Surgical History:  Procedure Laterality Date   CARDIOVERSION N/A 09/28/2018   Procedure: CARDIOVERSION;  Surgeon: Rolan Ezra RAMAN, MD;  Location: [facility] ENDOSCOPY;  Service: Cardiovascular;  Laterality: N/A;   CORONARY ANGIOPLASTY WITH STENT PLACEMENT     CORONARY STENT INTERVENTION N/A 09/14/2017   Procedure: CORONARY STENT INTERVENTION;  Surgeon: Clay Alm ORN, MD;  Location: [facility] INVASIVE CV LAB;  Service: Cardiovascular:: p-mLAD 95% (tandem lesions 80&95%) --> PCI with 2 overlapping Xience Sierra DES (distal 4.0 x 38 -> prox 4.0 x 12 --> post-dilated to ~4.6 mm.], 0% residual   CORONARY STENT INTERVENTION N/A 09/04/2018   Procedure: CORONARY STENT INTERVENTION;  Surgeon: Jordan, Peter M, MD;  Location: [facility] INVASIVE CV LAB;  Service: Cardiovascular: m-dCx 90% (DES PCI Xience Sierra 3.5 x 15 -- 3.8 mm)    CORONARY/GRAFT ACUTE MI REVASCULARIZATION N/A 09/14/2017   Procedure: Coronary/Graft Acute MI Revascularization;  Surgeon: Clay Alm ORN, MD;  Location: [facility] INVASIVE CV LAB;  Service: Cardiovascular;  Laterality: N/A;   LEFT HEART CATH AND CORONARY ANGIOGRAPHY N/A 09/14/2017   Procedure: LEFT HEART CATH AND CORONARY ANGIOGRAPHY;  Surgeon: Clay Alm ORN, MD;  Location: [facility] INVASIVE CV LAB;  p-m LAD 85-95% tandem lesions (PCI), 0% residual.  OstD2 ~50%, m-dCx 60%, LPL1 ~50%. Small Non-dom RCA 100% CTO ~mid.  EF ~45-50% with apical anterior, apical & inferoapical HK.   LEFT HEART CATH AND CORONARY ANGIOGRAPHY N/A 09/04/2018   Procedure: LEFT HEART CATH AND CORONARY ANGIOGRAPHY;  Surgeon: Jordan, Peter M, MD;  Location: [facility] INVASIVE CV LAB;  Service: Cardiovascular: m-dCx 90% (DES PCI). Patent p-mLAD (STENT SIERRA 4.00 X 38 MM. --Postdilated to 4.6 mm) stent with stable 50% ost D2  (jailed). known CTO of pRCA with R-R collaterals (non-dominant).     ROTATOR CUFF REPAIR Right    TRANSTHORACIC ECHOCARDIOGRAM  09/2017; 08/2018   a) In setting of anterior STEMI: EF 50 and 55%.  Moderate LVH.  Moderate HK of mid-apical anteroseptal wall.  Mild aortic valve calcification.;; b) EF up to 55-60%.  No R WMA.  Unable to assess diastolic function because of atrial fibrillation.  Mild LA dilation.  Trivial MR.    TRANSTHORACIC ECHOCARDIOGRAM  04/2020   In the setting of syncope  related to allergic reaction.  EF 45 to 50%.  Difficult to assess regional wall motion.  Moderately reduced RV function.  Mild to moderate MAC.  Aortic sclerosis but no stenosis.   TRANSTHORACIC ECHOCARDIOGRAM  01/15/2021   EF 60 to 65%.  No R WMA.  Mild LVH. ~Normal diastolic parameters.  Trivial MR.  Mild aortic valve sclerosis but no stenosis.--EF improved from 45 to 50% up to 60-65% when compared to July 2021.   UMBILICAL HERNIA REPAIR      Allergies  Allergies[1]  History of Present Illness    Travis Key has a PMH of coronary  artery disease status post anterior (STEMI-LAD PCI and occluded RCA followed by NSTEMI with circumflex PCI), HFmrEF, paroxysmal atrial fibrillation, hyperlipidemia, PVCs, and hypertension. LVEF of 45-50% which improved to 60-65 percent 2022.  CHA2DS2-VASc score 5 (CHF, HTN, vascular disease, age x 2)  He was seen in follow-up by Dr. Anner on 03/01/2024.  During that time he was doing well.  He was asymptomatic.  He enjoyed activities such as walking and gardening.  He denied exertional chest discomfort.  He did not PND and orthopnea.  He denied palpitations.  He denied PVCs.  He was monitoring his heart rate with his smart watch.  It was showing heart rates in the 50s.  His blood pressure at home was in the 130 systolic.  His medication regimen was continued.  In the clinic his blood pressure was noted to be in the 140s over 60s.  HCTZ 12.5 mg was added to his medication regimen.  This was combined with his irbesartan .  He presents to the clinic today for follow-up evaluation and states he feels well.  He continues to be somewhat physically active.  He has been staying indoors with the cooler weather.  He does get out and walk with the nicer weather.  He has been eating a regular diet.  He denies cardiac symptoms today.  I will continue his current medication regimen and plan follow-up in 6 months.  Today he denies chest pain, shortness of breath, lower extremity edema, fatigue, palpitations, melena, hematuria, hemoptysis, diaphoresis, weakness, presyncope, syncope, orthopnea, and PND.    Home Medications    Prior to Admission medications  Medication Sig Start Date End Date Taking? Authorizing Provider  amLODipine  (NORVASC ) 10 MG tablet Take 1 tablet (10 mg total) by mouth daily. 08/31/24 11/29/24  Anner Alm ORN, MD  apixaban  (ELIQUIS ) 5 MG TABS tablet Take 1 tablet (5 mg total) by mouth 2 (two) times daily. Needs Labs for Eliquis  come to office 11/28/23   Anner Alm ORN, MD  apixaban  (ELIQUIS ) 5  MG TABS tablet TAKE 1 TABLET BY MOUTH TWICE A DAY 03/01/24   Anner Alm ORN, MD  apixaban  (ELIQUIS ) 5 MG TABS tablet TAKE 1 TABLET BY MOUTH TWICE A DAY 06/29/24   Anner Alm ORN, MD  ezetimibe  (ZETIA ) 10 MG tablet Take 1 tablet (10 mg total) by mouth daily. 03/01/24   Anner Alm ORN, MD  irbesartan -hydrochlorothiazide  (AVALIDE) 300-12.5 MG tablet Take 1 tablet by mouth daily. 03/01/24   Anner Alm ORN, MD  metoprolol  succinate (TOPROL  XL) 25 MG 24 hr tablet Take 1 tablet (25 mg total) by mouth daily. 03/01/24   Anner Alm ORN, MD  nitroGLYCERIN  (NITROSTAT ) 0.4 MG SL tablet Place 1 tablet (0.4 mg total) under the tongue every 5 (five) minutes as needed. 03/01/24   Anner Alm ORN, MD  rosuvastatin  (CRESTOR ) 40 MG tablet Take 1 tablet (40 mg total)  by mouth at bedtime. 03/01/24   Anner Alm ORN, MD  sodium chloride  (OCEAN) 0.65 % SOLN nasal spray Place 4 sprays into both nostrils every 4 (four) hours while awake. 04/20/20   Drusilla Sabas RAMAN, MD    Family History    Family History  Problem Relation Age of Onset   Diabetes Mother    Liver disease Mother    He indicated that his mother is deceased. He indicated that his father is deceased.  Social History    Social History   Socioeconomic History   Marital status: Widowed    Spouse name: Not on file   Number of children: 4   Years of education: Not on file   Highest education level: Not on file  Occupational History   Occupation: retired it sales professional  Tobacco Use   Smoking status: Former    Current packs/day: 0.00    Types: Cigarettes    Quit date: 03/02/1985    Years since quitting: 39.6   Smokeless tobacco: Never  Vaping Use   Vaping status: Unknown  Substance and Sexual Activity   Alcohol use: No    Alcohol/week: 0.0 standard drinks of alcohol   Drug use: No   Sexual activity: Not on file  Other Topics Concern   Not on file  Social History Narrative   ** Merged History Encounter **       Social Drivers of Health    Tobacco Use: Medium Risk (10/19/2024)   Patient History    Smoking Tobacco Use: Former    Smokeless Tobacco Use: Never    Passive Exposure: Not on Actuary Strain: Not on file  Food Insecurity: Not on file  Transportation Needs: Not on file  Physical Activity: Not on file  Stress: Not on file  Social Connections: Not on file  Intimate Partner Violence: Not on file  Depression (EYV7-0): Not on file  Alcohol Screen: Not on file  Housing: Not on file  Utilities: Not on file  Health Literacy: Not on file     Review of Systems    General:  No chills, fever, night sweats or weight changes.  Cardiovascular:  No chest pain, dyspnea on exertion, edema, orthopnea, palpitations, paroxysmal nocturnal dyspnea. Dermatological: No rash, lesions/masses Respiratory: No cough, dyspnea Urologic: No hematuria, dysuria Abdominal:   No nausea, vomiting, diarrhea, bright red blood per rectum, melena, or hematemesis Neurologic:  No visual changes, wkns, changes in mental status. All other systems reviewed and are otherwise negative except as noted above.  Physical Exam    VS:  BP 126/74   Pulse 70   Ht 5' 11 (1.803 m)   Wt 221 lb (100.2 kg)   SpO2 98%   BMI 30.82 kg/m  , BMI Body mass index is 30.82 kg/m. GEN: Well nourished, well developed, in no acute distress. HEENT: normal. Neck: Supple, no JVD, carotid bruits, or masses. Cardiac: RRR, no murmurs, rubs, or gallops. No clubbing, cyanosis, edema.  Radials/DP/PT 2+ and equal bilaterally.  Respiratory:  Respirations regular and unlabored, clear to auscultation bilaterally. GI: Soft, nontender, nondistended, BS + x 4. MS: no deformity or atrophy. Skin: warm and dry, no rash. Neuro:  Strength and sensation are intact. Psych: Normal affect.  Accessory Clinical Findings    Recent Labs: 03/30/2024: BUN 16; Creatinine, Ser 1.30; Hemoglobin 15.3; Platelets 213; Potassium 4.4; Sodium 137   Recent Lipid Panel    Component  Value Date/Time   CHOL 113 03/30/2024 0905   TRIG 63 03/30/2024  0905   HDL 42 03/30/2024 0905   CHOLHDL 2.7 03/30/2024 0905   CHOLHDL 3.4 09/15/2017 0012   VLDL 18 09/15/2017 0012   LDLCALC 57 03/30/2024 0905         ECG personally reviewed by me today- EKG Interpretation Date/Time:  Tuesday October 19 2024 08:53:18 EST Ventricular Rate:  70 PR Interval:  206 QRS Duration:  92 QT Interval:  412 QTC Calculation: 444 R Axis:   2  Text Interpretation: sinus rhythm with PVCs Nonspecific ST abnormality When compared with ECG of 01-Mar-2024 16:30, Current undetermined rhythm precludes rhythm comparison, needs review Confirmed by Emelia Hazy [phone]) on 10/19/2024 8:57:37 AM  Cardiac catheterizations 11/19 and 12/18  09/04/2018 Ost 2nd Diag lesion is 50% stenosed. Mid Cx to Dist Cx lesion is 90% stenosed. Prox RCA to Mid RCA lesion is 100% stenosed. Previously placed Prox LAD to Mid LAD stent (unknown type) is widely patent. Post intervention, there is a 0% residual stenosis. A drug-eluting stent was successfully placed using a STENT SIERRA 3.50 X 15 MM. LV end diastolic pressure is normal.   1. 2 vessel obstructive CAD.    - continued patency of prior LAD stents    - 90% mid LCx- new from prior and the culprit lesion    - 100% CTO of a nondominant RCA 2. Normal LVEDP 3. Successful PCI of the LCx with DES x 1.   Plan: Echo to assess LV function. DAPT. If no bleeding can start anticoagulation tomorrow.    Recommend to resume Apixaban , at currently prescribed dose and frequency, on 09/05/18.  Recommend concurrent antiplatelet therapy of Aspirin  81mg  daily for 1 month and Clopidogrel  75mg  daily for 12 months.  Diagnostic Dominance: Left  Intervention      Echocardiogram 01/15/2021  IMPRESSIONS     1. Left ventricular ejection fraction, by estimation, is 60 to 65%. The  left ventricle has normal function. The left ventricle has no regional  wall motion abnormalities.  There is mild left ventricular hypertrophy.  Left ventricular diastolic parameters  were normal.   2. Right ventricular systolic function is normal. The right ventricular  size is normal.   3. The mitral valve is normal in structure. Trivial mitral valve  regurgitation.   4. Aortic valve regurgitation is not visualized. Mild aortic valve  sclerosis is present, with no evidence of aortic valve stenosis.   5. The inferior vena cava is normal in size with greater than 50%  respiratory variability, suggesting right atrial pressure of 3 mmHg.   Comparison(s): The left ventricular function has improved. 04/17/20 EF  45-50%.   FINDINGS   Left Ventricle: Left ventricular ejection fraction, by estimation, is 60  to 65%. The left ventricle has normal function. The left ventricle has no  regional wall motion abnormalities. Definity  contrast agent was given IV  to delineate the left ventricular   endocardial borders. The left ventricular internal cavity size was normal  in size. There is mild left ventricular hypertrophy. Left ventricular  diastolic parameters were normal.   Right Ventricle: The right ventricular size is normal. Right vetricular  wall thickness was not assessed. Right ventricular systolic function is  normal.   Left Atrium: Left atrial size was normal in size.   Right Atrium: Right atrial size was normal in size.   Pericardium: There is no evidence of pericardial effusion.   Mitral Valve: The mitral valve is normal in structure. Trivial mitral  valve regurgitation.   Tricuspid Valve: The tricuspid valve is  normal in structure. Tricuspid  valve regurgitation is mild.   Aortic Valve: Aortic valve regurgitation is not visualized. Mild aortic  valve sclerosis is present, with no evidence of aortic valve stenosis.   Pulmonic Valve: The pulmonic valve was not well visualized. Pulmonic valve  regurgitation is not visualized.   Aorta: The aortic root and ascending aorta are  structurally normal, with  no evidence of dilitation.   Venous: The inferior vena cava is normal in size with greater than 50%  respiratory variability, suggesting right atrial pressure of 3 mmHg.   IAS/Shunts: No atrial level shunt detected by color flow Doppler.      Assessment & Plan   1.  Essential hypertension-BP today 126/74.  Tolerating HCTZ well. Maintain blood pressure log Heart healthy low-sodium diet Maintain physical activity Continue irbesartan , HCTZ, metoprolol , amlodipine   Coronary artery disease-denies exertional chest discomfort.  Previously noted to have three-vessel disease with PCI to his LAD circumflex.  Noted to have occluded RCA with left-to-right collaterals. Heart healthy low-sodium diet Continue rosuvastatin , ezetimibe , irbesartan , amlodipine  Maintain physical activity  Hyperlipidemia-LDL 57 on 6/25. High-fiber diet Continue rosuvastatin , ezetimibe   PVCs-heart rate today 70 bmp.  Cardiac unaware.  Denies lightheadedness, presyncope or syncope Avoid triggers caffeine, chocolate, EtOH, dehydration excetra.  Paroxysmal atrial fibrillation-heart rate today 70 bpm.  CHA2DS2-VASc score 5.  Reports compliance with apixaban .  Denies bleeding issues. Continue metoprolol , apixaban  Continue to monitor  Disposition: Follow-up with Dr. Anner or me in 6 months.   Josefa HERO. Beulah Capobianco NP-C     10/19/2024, 8:59 AM [facility] [address] [place], [place] [address] Office [phone]    Notice: This dictation was prepared with Dragon dictation along with smaller phrase technology. Any transcriptional errors that result from this process are unintentional and may not be corrected upon review.   I spent 14 minutes examining this patient, reviewing medications, and using patient centered shared decision making involving their cardiac care.   I spent  20 minutes reviewing past medical history,  medications, and prior  cardiac tests.     [1]  Allergies Allergen Reactions   Crab [Shellfish Allergy] Anaphylaxis   "

## 2024-10-19 ENCOUNTER — Ambulatory Visit: Attending: General Practice | Admitting: General Practice

## 2024-10-19 ENCOUNTER — Encounter: Payer: Self-pay | Admitting: General Practice

## 2024-10-19 VITALS — BP 126/74 | HR 70 | Ht 71.0 in | Wt 221.0 lb

## 2024-10-19 DIAGNOSIS — I10 Essential (primary) hypertension: Secondary | ICD-10-CM | POA: Diagnosis not present

## 2024-10-19 DIAGNOSIS — I480 Paroxysmal atrial fibrillation: Secondary | ICD-10-CM | POA: Diagnosis not present

## 2024-10-19 DIAGNOSIS — I2510 Atherosclerotic heart disease of native coronary artery without angina pectoris: Secondary | ICD-10-CM | POA: Diagnosis not present

## 2024-10-19 DIAGNOSIS — I493 Ventricular premature depolarization: Secondary | ICD-10-CM | POA: Diagnosis not present

## 2024-10-19 DIAGNOSIS — E785 Hyperlipidemia, unspecified: Secondary | ICD-10-CM | POA: Diagnosis not present

## 2024-10-19 MED ORDER — AMLODIPINE BESYLATE 10 MG PO TABS: 10.0000 mg | ORAL_TABLET | Freq: Every day | ORAL | 3 refills | Status: AC

## 2024-10-19 MED ORDER — ROSUVASTATIN CALCIUM 40 MG PO TABS: 40.0000 mg | ORAL_TABLET | Freq: Every day | ORAL | 3 refills | Status: AC

## 2024-10-19 NOTE — Patient Instructions (Signed)
 Medication Instructions:  Your physician has recommended you make the following change in your medication:   *If you need a refill on your cardiac medications before your next appointment, please call your pharmacy*  Lab Work: None  If you have labs (blood work) drawn today and your tests are completely normal, you will receive your results only by: MyChart Message (if you have MyChart) OR A paper copy in the mail If you have any lab test that is abnormal or we need to change your treatment, we will call you to review the results.  Testing/Procedures: None   Follow-Up: At [facility], you and your health needs are our priority.  As part of our continuing mission to provide you with exceptional heart care, our providers are all part of one team.  This team includes your primary Cardiologist (physician) and Advanced Practice Providers or APPs (Physician Assistants and Nurse Practitioners) who all work together to provide you with the care you need, when you need it.  Your next appointment:   6 month(s)  Provider:   Alm Clay, MD or Josefa Beauvais, NP   We recommend signing up for the patient portal called MyChart.  Sign up information is provided on this After Visit Summary.  MyChart is used to connect with patients for Virtual Visits (Telemedicine).  Patients are able to view lab/test results, encounter notes, upcoming appointments, etc.  Non-urgent messages can be sent to your provider as well.   To learn more about what you can do with MyChart, go to [email or web address].   Other Instructions
